# Patient Record
Sex: Male | Born: 1950 | Race: Black or African American | Hispanic: No | Smoking: Current every day smoker
Health system: Southern US, Community
[De-identification: ages and names within clinical notes are randomized; demographics above are authoritative.]

## PROBLEM LIST (undated history)

## (undated) DIAGNOSIS — E876 Hypokalemia: Secondary | ICD-10-CM

## (undated) DIAGNOSIS — F0390 Unspecified dementia without behavioral disturbance: Secondary | ICD-10-CM

## (undated) DIAGNOSIS — E119 Type 2 diabetes mellitus without complications: Secondary | ICD-10-CM

## (undated) DIAGNOSIS — I10 Essential (primary) hypertension: Secondary | ICD-10-CM

## (undated) DIAGNOSIS — E7800 Pure hypercholesterolemia, unspecified: Secondary | ICD-10-CM

## (undated) HISTORY — DX: Unspecified dementia, unspecified severity, without behavioral disturbance, psychotic disturbance, mood disturbance, and anxiety: F03.90

## (undated) HISTORY — DX: Hypokalemia: E87.6

---

## 2018-10-02 ENCOUNTER — Encounter: Payer: Self-pay | Admitting: Emergency Medicine

## 2018-10-02 ENCOUNTER — Emergency Department: Payer: Medicare HMO

## 2018-10-02 ENCOUNTER — Inpatient Hospital Stay
Admission: EM | Admit: 2018-10-02 | Discharge: 2018-10-07 | DRG: 065 | Disposition: A | Discharge status: Home Health | Payer: Medicare HMO | Attending: Internal Medicine | Admitting: Internal Medicine

## 2018-10-02 ENCOUNTER — Other Ambulatory Visit: Payer: Self-pay

## 2018-10-02 DIAGNOSIS — I639 Cerebral infarction, unspecified: Principal | ICD-10-CM | POA: Diagnosis present

## 2018-10-02 DIAGNOSIS — H5310 Unspecified subjective visual disturbances: Secondary | ICD-10-CM

## 2018-10-02 DIAGNOSIS — N400 Enlarged prostate without lower urinary tract symptoms: Secondary | ICD-10-CM | POA: Diagnosis present

## 2018-10-02 DIAGNOSIS — N179 Acute kidney failure, unspecified: Secondary | ICD-10-CM | POA: Diagnosis present

## 2018-10-02 DIAGNOSIS — R4182 Altered mental status, unspecified: Secondary | ICD-10-CM

## 2018-10-02 DIAGNOSIS — Z79899 Other long term (current) drug therapy: Secondary | ICD-10-CM

## 2018-10-02 DIAGNOSIS — K219 Gastro-esophageal reflux disease without esophagitis: Secondary | ICD-10-CM | POA: Diagnosis present

## 2018-10-02 DIAGNOSIS — E119 Type 2 diabetes mellitus without complications: Secondary | ICD-10-CM | POA: Diagnosis present

## 2018-10-02 DIAGNOSIS — R0600 Dyspnea, unspecified: Secondary | ICD-10-CM

## 2018-10-02 DIAGNOSIS — E785 Hyperlipidemia, unspecified: Secondary | ICD-10-CM | POA: Diagnosis present

## 2018-10-02 DIAGNOSIS — F17210 Nicotine dependence, cigarettes, uncomplicated: Secondary | ICD-10-CM | POA: Diagnosis present

## 2018-10-02 DIAGNOSIS — Z1159 Encounter for screening for other viral diseases: Secondary | ICD-10-CM

## 2018-10-02 DIAGNOSIS — Z7984 Long term (current) use of oral hypoglycemic drugs: Secondary | ICD-10-CM

## 2018-10-02 DIAGNOSIS — I10 Essential (primary) hypertension: Secondary | ICD-10-CM | POA: Diagnosis present

## 2018-10-02 DIAGNOSIS — E7800 Pure hypercholesterolemia, unspecified: Secondary | ICD-10-CM | POA: Diagnosis present

## 2018-10-02 HISTORY — DX: Type 2 diabetes mellitus without complications: E11.9

## 2018-10-02 HISTORY — DX: Essential (primary) hypertension: I10

## 2018-10-02 HISTORY — DX: Pure hypercholesterolemia, unspecified: E78.00

## 2018-10-02 LAB — COMPREHENSIVE METABOLIC PANEL
ALT: 14 U/L (ref 0–44)
AST: 17 U/L (ref 15–41)
Albumin: 4.1 g/dL (ref 3.5–5.0)
Alkaline Phosphatase: 43 U/L (ref 38–126)
Anion gap: 10 (ref 5–15)
BUN: 21 mg/dL (ref 8–23)
CO2: 26 mmol/L (ref 22–32)
Calcium: 9.6 mg/dL (ref 8.9–10.3)
Chloride: 104 mmol/L (ref 98–111)
Creatinine, Ser: 2.14 mg/dL — ABNORMAL HIGH (ref 0.61–1.24)
GFR calc Af Amer: 36 mL/min — ABNORMAL LOW (ref >60)
GFR calc non Af Amer: 31 mL/min — ABNORMAL LOW (ref >60)
Glucose, Bld: 122 mg/dL — ABNORMAL HIGH (ref 70–99)
Potassium: 4.2 mmol/L (ref 3.5–5.1)
Sodium: 140 mmol/L (ref 135–145)
Total Bilirubin: 1.2 mg/dL (ref 0.3–1.2)
Total Protein: 6.8 g/dL (ref 6.5–8.1)

## 2018-10-02 LAB — CBC WITH DIFFERENTIAL/PLATELET
Abs Immature Granulocytes: 0.01 10*3/uL (ref 0.00–0.07)
Basophils Absolute: 0 10*3/uL (ref 0.0–0.1)
Basophils Relative: 0 %
Eosinophils Absolute: 0 10*3/uL (ref 0.0–0.5)
Eosinophils Relative: 0 %
HCT: 46 % (ref 39.0–52.0)
Hemoglobin: 15.1 g/dL (ref 13.0–17.0)
Immature Granulocytes: 0 %
Lymphocytes Relative: 33 %
Lymphs Abs: 1.6 10*3/uL (ref 0.7–4.0)
MCH: 32.2 pg (ref 26.0–34.0)
MCHC: 32.8 g/dL (ref 30.0–36.0)
MCV: 98.1 fL (ref 80.0–100.0)
Monocytes Absolute: 0.4 10*3/uL (ref 0.1–1.0)
Monocytes Relative: 9 %
Neutro Abs: 2.8 10*3/uL (ref 1.7–7.7)
Neutrophils Relative %: 58 %
Platelets: 200 10*3/uL (ref 150–400)
RBC: 4.69 MIL/uL (ref 4.22–5.81)
RDW: 11.9 % (ref 11.5–15.5)
WBC: 4.9 10*3/uL (ref 4.0–10.5)
nRBC: 0 % (ref 0.0–0.2)

## 2018-10-02 LAB — GLUCOSE, CAPILLARY: Glucose-Capillary: 121 mg/dL — ABNORMAL HIGH (ref 70–99)

## 2018-10-02 LAB — URINALYSIS, COMPLETE (UACMP) WITH MICROSCOPIC
Bacteria, UA: NONE SEEN
Bilirubin Urine: NEGATIVE
Glucose, UA: NEGATIVE mg/dL
Hgb urine dipstick: NEGATIVE
Ketones, ur: NEGATIVE mg/dL
Leukocytes,Ua: NEGATIVE
Nitrite: NEGATIVE
Protein, ur: 100 mg/dL — AB
Specific Gravity, Urine: 1.024 (ref 1.005–1.030)
pH: 5 (ref 5.0–8.0)

## 2018-10-02 LAB — AMMONIA: Ammonia: 14 umol/L (ref 9–35)

## 2018-10-02 LAB — SARS CORONAVIRUS 2 BY RT PCR (HOSPITAL ORDER, PERFORMED IN ~~LOC~~ HOSPITAL LAB): SARS Coronavirus 2: NEGATIVE

## 2018-10-02 LAB — TROPONIN I: Troponin I: 0.03 ng/mL (ref <0.03)

## 2018-10-02 LAB — TSH: TSH: 3.899 u[IU]/mL (ref 0.350–4.500)

## 2018-10-02 MED ORDER — FLUORESCEIN SODIUM 1 MG OP STRP
1.0000 | ORAL_STRIP | Freq: Once | OPHTHALMIC | Status: AC
  Administered 2018-10-02: 18:00:00 1 via OPHTHALMIC
  Filled 2018-10-02: qty 1

## 2018-10-02 MED ORDER — GENTAMICIN SULFATE 0.3 % OP SOLN: 1.0000 [drp] | Freq: Four times a day (QID) | OPHTHALMIC | 0 refills | Status: DC

## 2018-10-02 MED ORDER — TETRACAINE HCL 0.5 % OP SOLN
2.0000 [drp] | Freq: Once | OPHTHALMIC | Status: AC
  Administered 2018-10-02: 2 [drp] via OPHTHALMIC
  Filled 2018-10-02: qty 4

## 2018-10-02 MED ORDER — ASPIRIN 81 MG PO CHEW
324.0000 mg | CHEWABLE_TABLET | Freq: Once | ORAL | Status: AC
  Administered 2018-10-02: 324 mg via ORAL
  Filled 2018-10-02: qty 4

## 2018-10-02 NOTE — ED Provider Notes (Signed)
  Physical Exam  BP 105/66 (BP Location: Left Arm)   Pulse (!) 108   Temp 98.5 F (36.9 C) (Oral)   Resp 16   Ht 5\' 9"  (1.753 m)   Wt 94.3 kg   SpO2 97%   BMI 30.72 kg/m   Physical Exam Patient appears confused. ED Course/Procedures   Patient presents with altered mental status secondary to questionable contusion 2 days ago.  Procedures  MDM  Patient presents with altered mental status without acute CT head findings.  Patient will be further evaluated by Dr. Redmond School for definitive diagnosis and treatment.       Sable Feil, PA-C 10/02/18 1908    Nena Polio, MD 10/02/18 [phone]
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
## 2018-10-02 NOTE — ED Notes (Signed)
Went to discharge pt  Speech is slurred  States this is not normal for him  His gait is slightly unsteady

## 2018-10-03 ENCOUNTER — Inpatient Hospital Stay: Payer: Medicare HMO

## 2018-10-03 ENCOUNTER — Inpatient Hospital Stay
Admit: 2018-10-03 | Discharge: 2018-10-03 | Disposition: A | Discharge status: Home / Self Care | Payer: Medicare HMO | Attending: Nurse Practitioner | Admitting: Nurse Practitioner

## 2018-10-03 DIAGNOSIS — N179 Acute kidney failure, unspecified: Secondary | ICD-10-CM | POA: Diagnosis present

## 2018-10-03 DIAGNOSIS — Z79899 Other long term (current) drug therapy: Secondary | ICD-10-CM | POA: Diagnosis not present

## 2018-10-03 DIAGNOSIS — I10 Essential (primary) hypertension: Secondary | ICD-10-CM | POA: Diagnosis present

## 2018-10-03 DIAGNOSIS — R4182 Altered mental status, unspecified: Secondary | ICD-10-CM | POA: Diagnosis present

## 2018-10-03 DIAGNOSIS — I639 Cerebral infarction, unspecified: Secondary | ICD-10-CM | POA: Diagnosis present

## 2018-10-03 DIAGNOSIS — E7800 Pure hypercholesterolemia, unspecified: Secondary | ICD-10-CM | POA: Diagnosis present

## 2018-10-03 DIAGNOSIS — K219 Gastro-esophageal reflux disease without esophagitis: Secondary | ICD-10-CM | POA: Diagnosis present

## 2018-10-03 DIAGNOSIS — E785 Hyperlipidemia, unspecified: Secondary | ICD-10-CM | POA: Diagnosis present

## 2018-10-03 DIAGNOSIS — N400 Enlarged prostate without lower urinary tract symptoms: Secondary | ICD-10-CM | POA: Diagnosis present

## 2018-10-03 DIAGNOSIS — Z7984 Long term (current) use of oral hypoglycemic drugs: Secondary | ICD-10-CM | POA: Diagnosis not present

## 2018-10-03 DIAGNOSIS — F17210 Nicotine dependence, cigarettes, uncomplicated: Secondary | ICD-10-CM | POA: Diagnosis present

## 2018-10-03 DIAGNOSIS — E119 Type 2 diabetes mellitus without complications: Secondary | ICD-10-CM | POA: Diagnosis present

## 2018-10-03 DIAGNOSIS — Z1159 Encounter for screening for other viral diseases: Secondary | ICD-10-CM | POA: Diagnosis not present

## 2018-10-03 HISTORY — DX: Cerebral infarction, unspecified: I63.9

## 2018-10-03 LAB — LIPID PANEL
Cholesterol: 122 mg/dL (ref 0–200)
HDL: 42 mg/dL (ref >40)
LDL Cholesterol: 65 mg/dL (ref 0–99)
Total CHOL/HDL Ratio: 2.9 RATIO
Triglycerides: 76 mg/dL (ref <150)
VLDL: 15 mg/dL (ref 0–40)

## 2018-10-03 LAB — CREATININE, SERUM
Creatinine, Ser: 2.04 mg/dL — ABNORMAL HIGH (ref 0.61–1.24)
GFR calc Af Amer: 38 mL/min — ABNORMAL LOW (ref >60)
GFR calc non Af Amer: 33 mL/min — ABNORMAL LOW (ref >60)

## 2018-10-03 LAB — CBC
HCT: 42.7 % (ref 39.0–52.0)
Hemoglobin: 14.1 g/dL (ref 13.0–17.0)
MCH: 32.5 pg (ref 26.0–34.0)
MCHC: 33 g/dL (ref 30.0–36.0)
MCV: 98.4 fL (ref 80.0–100.0)
Platelets: 191 10*3/uL (ref 150–400)
RBC: 4.34 MIL/uL (ref 4.22–5.81)
RDW: 11.7 % (ref 11.5–15.5)
WBC: 4.1 10*3/uL (ref 4.0–10.5)
nRBC: 0 % (ref 0.0–0.2)

## 2018-10-03 LAB — GLUCOSE, CAPILLARY
Glucose-Capillary: 131 mg/dL — ABNORMAL HIGH (ref 70–99)
Glucose-Capillary: 164 mg/dL — ABNORMAL HIGH (ref 70–99)
Glucose-Capillary: 179 mg/dL — ABNORMAL HIGH (ref 70–99)
Glucose-Capillary: 100 mg/dL — ABNORMAL HIGH (ref 70–99)

## 2018-10-03 LAB — HEMOGLOBIN A1C
Hgb A1c MFr Bld: 5.5 % (ref 4.8–5.6)
Mean Plasma Glucose: 111.15 mg/dL

## 2018-10-03 LAB — ECHOCARDIOGRAM COMPLETE
Height: 69 in
Weight: 3328 oz

## 2018-10-03 MED ORDER — INSULIN ASPART 100 UNIT/ML ~~LOC~~ SOLN: 0.0000 [IU] | Freq: Three times a day (TID) | SUBCUTANEOUS | Status: DC

## 2018-10-03 MED ORDER — ACETAMINOPHEN 160 MG/5ML PO SOLN
650.0000 mg | ORAL | Status: DC | PRN
  Filled 2018-10-03: qty 20.3

## 2018-10-03 MED ORDER — STROKE: EARLY STAGES OF RECOVERY BOOK
Freq: Once | Status: AC
  Administered 2018-10-03: 04:00:00

## 2018-10-03 MED ORDER — INSULIN ASPART 100 UNIT/ML ~~LOC~~ SOLN
0.0000 [IU] | Freq: Three times a day (TID) | SUBCUTANEOUS | Status: DC
  Administered 2018-10-03: 2 [IU] via SUBCUTANEOUS
  Administered 2018-10-03 – 2018-10-04 (×2): 3 [IU] via SUBCUTANEOUS
  Administered 2018-10-05: 2 [IU] via SUBCUTANEOUS
  Administered 2018-10-05: 3 [IU] via SUBCUTANEOUS
  Administered 2018-10-06: 10:00:00 2 [IU] via SUBCUTANEOUS
  Administered 2018-10-06: 3 [IU] via SUBCUTANEOUS
  Administered 2018-10-06 – 2018-10-07 (×2): 2 [IU] via SUBCUTANEOUS
  Filled 2018-10-03 (×9): qty 1

## 2018-10-03 MED ORDER — METFORMIN HCL 500 MG PO TABS
500.0000 mg | ORAL_TABLET | Freq: Two times a day (BID) | ORAL | Status: DC
  Filled 2018-10-03 (×3): qty 1

## 2018-10-03 MED ORDER — ENOXAPARIN SODIUM 40 MG/0.4ML ~~LOC~~ SOLN
40.0000 mg | SUBCUTANEOUS | Status: DC
  Administered 2018-10-04 – 2018-10-06 (×3): 40 mg via SUBCUTANEOUS
  Filled 2018-10-03 (×3): qty 0.4

## 2018-10-03 MED ORDER — HALOPERIDOL LACTATE 5 MG/ML IJ SOLN
2.5000 mg | INTRAMUSCULAR | Status: DC | PRN
  Administered 2018-10-03 – 2018-10-06 (×3): 2.5 mg via INTRAVENOUS
  Filled 2018-10-03 (×3): qty 1

## 2018-10-03 MED ORDER — GLIPIZIDE ER 10 MG PO TB24
10.0000 mg | ORAL_TABLET | Freq: Every day | ORAL | Status: DC
  Administered 2018-10-03 – 2018-10-06 (×3): 10 mg via ORAL
  Filled 2018-10-03 (×5): qty 1

## 2018-10-03 MED ORDER — LORAZEPAM 2 MG/ML IJ SOLN
2.0000 mg | Freq: Once | INTRAMUSCULAR | Status: AC
  Administered 2018-10-03: 15:00:00 2 mg via INTRAVENOUS
  Filled 2018-10-03: qty 1

## 2018-10-03 MED ORDER — ASPIRIN 325 MG PO TABS
325.0000 mg | ORAL_TABLET | Freq: Every day | ORAL | Status: DC
  Administered 2018-10-04 – 2018-10-06 (×3): 325 mg via ORAL
  Filled 2018-10-03 (×5): qty 1

## 2018-10-03 MED ORDER — INSULIN ASPART 100 UNIT/ML ~~LOC~~ SOLN
0.0000 [IU] | Freq: Every day | SUBCUTANEOUS | Status: DC
  Administered 2018-10-05: 22:00:00 5 [IU] via SUBCUTANEOUS
  Filled 2018-10-03: qty 1

## 2018-10-03 MED ORDER — INSULIN ASPART 100 UNIT/ML ~~LOC~~ SOLN: 0.0000 [IU] | Freq: Every day | SUBCUTANEOUS | Status: DC

## 2018-10-03 MED ORDER — SODIUM CHLORIDE 0.9 % IV SOLN
INTRAVENOUS | Status: DC
  Administered 2018-10-03: 04:00:00 via INTRAVENOUS

## 2018-10-03 MED ORDER — ACETAMINOPHEN 325 MG PO TABS: 650.0000 mg | ORAL_TABLET | ORAL | Status: DC | PRN

## 2018-10-03 MED ORDER — AMLODIPINE BESYLATE 10 MG PO TABS
10.0000 mg | ORAL_TABLET | Freq: Every day | ORAL | Status: DC
  Administered 2018-10-03 – 2018-10-05 (×3): 10 mg via ORAL
  Filled 2018-10-03 (×3): qty 1

## 2018-10-03 MED ORDER — PANTOPRAZOLE SODIUM 40 MG IV SOLR
40.0000 mg | INTRAVENOUS | Status: DC
  Administered 2018-10-03 – 2018-10-05 (×3): 40 mg via INTRAVENOUS
  Filled 2018-10-03 (×3): qty 40

## 2018-10-03 MED ORDER — ACETAMINOPHEN 650 MG RE SUPP: 650.0000 mg | RECTAL | Status: DC | PRN

## 2018-10-03 MED ORDER — TAMSULOSIN HCL 0.4 MG PO CAPS
0.4000 mg | ORAL_CAPSULE | Freq: Two times a day (BID) | ORAL | Status: DC
  Administered 2018-10-03 – 2018-10-06 (×6): 0.4 mg via ORAL
  Filled 2018-10-03 (×6): qty 1

## 2018-10-03 MED ORDER — LISINOPRIL 20 MG PO TABS
20.0000 mg | ORAL_TABLET | Freq: Every day | ORAL | Status: DC
  Administered 2018-10-03 – 2018-10-06 (×4): 20 mg via ORAL
  Filled 2018-10-03 (×4): qty 1

## 2018-10-03 MED ORDER — ENOXAPARIN SODIUM 40 MG/0.4ML ~~LOC~~ SOLN: 30.0000 mg | SUBCUTANEOUS | Status: DC

## 2018-10-03 MED ORDER — ATORVASTATIN CALCIUM 20 MG PO TABS
40.0000 mg | ORAL_TABLET | Freq: Every day | ORAL | Status: DC
  Administered 2018-10-03 – 2018-10-06 (×4): 40 mg via ORAL
  Filled 2018-10-03 (×4): qty 2

## 2018-10-03 MED ORDER — SENNOSIDES-DOCUSATE SODIUM 8.6-50 MG PO TABS: 1.0000 | ORAL_TABLET | Freq: Every evening | ORAL | Status: DC | PRN

## 2018-10-03 NOTE — Progress Notes (Signed)
*  PRELIMINARY RESULTS* Echocardiogram 2D Echocardiogram has been performed.  Steven Ingram 10/03/2018, 11:05 AM
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
## 2018-10-03 NOTE — Progress Notes (Signed)
Code 300 called due to patient wandering and not able to be redirected. Wife and daughter called to attempt reorientation. Patient adamant about leaving and going home. Patient attempting to go in other patients rooms.   Haldol given as ordered.   2:55 PM Wife on the way to stay with patient to assist with behavior.   Madlyn Frankel, RN

## 2018-10-03 NOTE — Progress Notes (Signed)
Anticoagulation monitoring(Lovenox):  68yo  male ordered Lovenox 30 mg Q24h for DVT prevention  Filed Weights   10/02/18 1611  Weight: 208 lb (94.3 kg)   BMI 30.72   Lab Results  Component Value Date   CREATININE 2.14 (H) 10/02/2018   Estimated Creatinine Clearance: 37.4 mL/min (A) (by C-G formula based on SCr of 2.14 mg/dL (H)). Hemoglobin & Hematocrit     Component Value Date/Time   HGB 15.1 10/02/2018 1932   HCT 46.0 10/02/2018 1932     Per Protocol for Patient with estCrcl > 30 ml/min and BMI < 40, will transition to Lovenox 40 mg Q24h.     Paulina Fusi, PharmD, BCPS 10/03/2018 3:59 AM

## 2018-10-03 NOTE — Plan of Care (Signed)
  Problem: Education: Goal: Knowledge of disease or condition will improve Outcome: Progressing   Problem: Education: Goal: Knowledge of secondary prevention will improve Outcome: Progressing   Problem: Coping: Goal: Will identify appropriate support needs Outcome: Progressing   Problem: Self-Care: Goal: Ability to participate in self-care as condition permits will improve Outcome: Progressing   Problem: Self-Care: Goal: Ability to communicate needs accurately will improve Outcome: Progressing   Problem: Clinical Measurements: Goal: Diagnostic test results will improve Outcome: Progressing   Problem: Pain Managment: Goal: General experience of comfort will improve Outcome: Progressing   Problem: Safety: Goal: Ability to remain free from injury will improve Outcome: Progressing

## 2018-10-03 NOTE — Evaluation (Signed)
Occupational Therapy Evaluation Patient Details Name: Steven Ingram MRN: [id number] DOB: [birth year] Today's Date: 10/03/2018    History of Present Illness 68 y/o male here with L sided head ache, eye pain and found to have had small CVA.  At time of PT eval he reports feeling much better than on arrival and near his baseline.    Clinical Impression   Pt seen for OT evaluation this date. Pt received ambulating in room, placing pillows in closet, RN having just left room. Pt denies deficits or symptoms, eager to return home. Supervision to CGA for ambulation, pt refusing RW. Max verbal cues for safety. No awareness of deficits or safety. No overt deficits in strength, coordination, pt denies sensory deficits. Pt denies visual deficits and no visual deficits appreciated upon assessment. Pt demonstrates impaired cognition, particularly awareness/safety. Pt will benefit from skilled OT at home with intermittent supervision for safety. Pt unsafe to drive at this time 2/2 his cognitive deficits. All further OT needs can be addressed at the next venue. Will sign off in house.     Follow Up Recommendations  Home health OT;Supervision - Intermittent    Equipment Recommendations  None recommended by OT    Recommendations for Other Services       Precautions / Restrictions Precautions Precautions: Fall Restrictions Weight Bearing Restrictions: No      Mobility Bed Mobility Overal bed mobility: Independent                Transfers Overall transfer level: Modified independent Equipment used: None                  Balance Overall balance assessment: Mild deficits observed, not formally tested                                         ADL either performed or assessed with clinical judgement   ADL Overall ADL's : Needs assistance/impaired                                       General ADL Comments: Requires supervision for safety      Vision Baseline Vision/History: No visual deficits Patient Visual Report: No change from baseline Vision Assessment?: No apparent visual deficits     Perception     Praxis      Pertinent Vitals/Pain Pain Assessment: No/denies pain     Hand Dominance     Extremity/Trunk Assessment Upper Extremity Assessment Upper Extremity Assessment: Overall WFL for tasks assessed;Difficult to assess due to impaired cognition   Lower Extremity Assessment Lower Extremity Assessment: Overall WFL for tasks assessed;Difficult to assess due to impaired cognition   Cervical / Trunk Assessment Cervical / Trunk Assessment: Normal   Communication Communication Communication: No difficulties   Cognition Arousal/Alertness: Awake/alert Behavior During Therapy: Restless;Impulsive Overall Cognitive Status: No family/caregiver present to determine baseline cognitive functioning                                 General Comments: Pt impulsive, restless, will not sit down for more than a few minutes, requires mod verbal cues to redirect, oriented to self and generally to place, follows simple commands with encouragement, poor safety awareness and awareness of deficits   General Comments  Exercises Other Exercises Other Exercises: amb around nursing unit x2 requiring cues for safety and negotiating environmental barriers   Shoulder Instructions      Home Living Family/patient expects to be discharged to:: Private residence Living Arrangements: Spouse/significant other(girlfriend)     Home Access: Stairs to enter                     [address]: None          Prior Functioning/Environment Level of Independence: Independent        Comments: Pt reports he is able to drive, be relatively active, does not need AD        OT Problem List: Decreased cognition;Decreased safety awareness;Impaired balance (sitting and/or standing)      OT  Treatment/Interventions:      OT Goals(Current goals can be found in the care plan section) Acute Rehab OT Goals Patient Stated Goal: Go home OT Goal Formulation: All assessment and education complete, DC therapy ADL Goals Additional ADL Goal #1: Pt will perform UB and LB dressing tasks with supervision and Min VC for safety. Additional ADL Goal #2: Pt will demonstrate ability to safely negotiate environment with supervision and Min VC for safety.  OT Frequency:     Barriers to D/C:            Co-evaluation              AM-PAC OT "6 Clicks" Daily Activity     Outcome Measure Help from another person eating meals?: None Help from another person taking care of personal grooming?: None Help from another person toileting, which includes using toliet, bedpan, or urinal?: A Little Help from another person bathing (including washing, rinsing, drying)?: A Little Help from another person to put on and taking off regular upper body clothing?: None Help from another person to put on and taking off regular lower body clothing?: A Little 6 Click Score: 21   End of Session    Activity Tolerance: Patient tolerated treatment well Patient left: Other (comment)(amb back to room with RN)  OT Visit Diagnosis: Other symptoms and signs involving cognitive function;Other abnormalities of gait and mobility (R26.89)                Time: 3832-9191 OT Time Calculation (min): 17 min Charges:  OT General Charges $OT Visit: 1 Visit OT Evaluation $OT Eval Low Complexity: 1 Low  Jeni Salles, MPH, MS, OTR/L ascom [phone] 10/03/18, 3:10 PM

## 2018-10-03 NOTE — Progress Notes (Signed)
[facility] [place] NAME: Steven Ingram    MR#:  [id number]  DATE OF BIRTH:  12/18/1950  SUBJECTIVE:   he presented to the hospital due to altered mental status and confusion and had a MRI of the brain which was positive for a Subcentimeter acute/early subacute infarctions within left posterior external capsule and right posteromedial pons.  Patient clinically denies any complaints presently.  He is somewhat confused and altered.  REVIEW OF SYSTEMS:    Review of Systems  Unable to perform ROS: Mental acuity    Nutrition: Heart Healthy/Carb modified Tolerating Diet: Yes Tolerating PT: Eval noted.    DRUG ALLERGIES:  No Known Allergies  VITALS:  Blood pressure (!) 161/97, pulse 74, temperature 98.4 F (36.9 C), temperature source Oral, resp. rate 19, height 5\' 9"  (1.753 m), weight 94.3 kg, SpO2 98 %.  PHYSICAL EXAMINATION:   Physical Exam  GENERAL:  68 y.o.-year-old patient sitting up in bed in no acute distress.  EYES: Pupils equal, round, reactive to light and accommodation. No scleral icterus. Extraocular muscles intact.  HEENT: Head atraumatic, normocephalic. Oropharynx and nasopharynx clear.  NECK:  Supple, no jugular venous distention. No thyroid enlargement, no tenderness.  LUNGS: Normal breath sounds bilaterally, no wheezing, rales, rhonchi. No use of accessory muscles of respiration.  CARDIOVASCULAR: S1, S2 normal. No murmurs, rubs, or gallops.  ABDOMEN: Soft, nontender, nondistended. Bowel sounds present. No organomegaly or mass.  EXTREMITIES: No cyanosis, clubbing or edema b/l.    NEUROLOGIC: Cranial nerves II through XII are intact. No focal Motor or sensory deficits b/l.   PSYCHIATRIC: The patient is alert and oriented x 2.  SKIN: No obvious rash, lesion, or ulcer.    LABORATORY PANEL:   CBC Recent Labs  Lab 10/03/18 0440  WBC 4.1  HGB 14.1  HCT 42.7  PLT 191    ------------------------------------------------------------------------------------------------------------------  Chemistries  Recent Labs  Lab 10/02/18 1932 10/03/18 0440  NA 140  --   K 4.2  --   CL 104  --   CO2 26  --   GLUCOSE 122*  --   BUN 21  --   CREATININE 2.14* 2.04*  CALCIUM 9.6  --   AST 17  --   ALT 14  --   ALKPHOS 43  --   BILITOT 1.2  --    ------------------------------------------------------------------------------------------------------------------  Cardiac Enzymes Recent Labs  Lab 10/02/18 1932  TROPONINI 0.03*   ------------------------------------------------------------------------------------------------------------------  RADIOLOGY:  Dg Chest 2 View  Result Date: 10/03/2018 CLINICAL DATA:  68 y/o  M; dyspnea. EXAM: CHEST - 2 VIEW COMPARISON:  10/02/2018 chest radiograph. FINDINGS: Stable cardiac silhouette within normal limits given projection and technique. Aortic atherosclerosis with calcification. Mild bronchitic changes in the lung bases. No focal consolidation. No pleural effusion or pneumothorax. No acute osseous abnormality is evident. Surgical clips project over the right upper quadrant. IMPRESSION: Mild bronchitic changes in the lung bases. No focal consolidation. Electronically Signed   By: Kristine Garbe M.D.   On: 10/03/2018 02:03   Ct Head Wo Contrast  Result Date: 10/02/2018 CLINICAL DATA:  Altered level consciousness.  Fall 2 days ago. EXAM: CT HEAD WITHOUT CONTRAST TECHNIQUE: Contiguous axial images were obtained from the base of the skull through the vertex without intravenous contrast. COMPARISON:  None. FINDINGS: Brain: Mild atrophy. Mild chronic ischemia in the thalamus bilaterally. Hypodensity left frontal white matter consistent with chronic ischemia. Negative for acute infarct, hemorrhage, or mass lesion. Vascular:  Negative for hyperdense vessel Skull: Negative for skull fracture. Sinuses/Orbits: Negative Other:  Soft tissue swelling left parietal scalp most compatible with recent contusion. IMPRESSION: No acute intracranial abnormality.  Chronic ischemic changes. Left parietal scalp contusion. Electronically Signed   By: Franchot Gallo M.D.   On: 10/02/2018 18:50   Mr Brain Wo Contrast  Result Date: 10/02/2018 CLINICAL DATA:  68 y/o  M; confusion, unsteady, slurred speech. EXAM: MRI HEAD WITHOUT CONTRAST TECHNIQUE: Multiplanar, multiecho pulse sequences of the brain and surrounding structures were obtained without intravenous contrast. COMPARISON:  10/02/2018 CT head. FINDINGS: Brain: Subcentimeter foci of reduced diffusion within the left posterior external capsule and right posteromedial pons (series 3 image 18 and 27) compatible with acute/early subacute infarction. Infarcts are T2 FLAIR hyperintense. No hemorrhage or mass effect. Early confluent nonspecific T2 FLAIR hyperintensities in subcortical and periventricular white matter as well as the pons are compatible with moderate chronic microvascular ischemic changes. Moderate volume loss of the brain. Very small chronic infarcts are present within the bilateral thalami, left anterior corona radiata, and the pons. 11 mm dural nodule over the left posterior frontal convexity (series 9, image 20 and series 12, image 16). No mass effect on the brain. Vascular: Normal flow voids. Skull and upper cervical spine: Normal marrow signal. Sinuses/Orbits: Negative. Other: None. IMPRESSION: 1. Subcentimeter acute/early subacute infarctions within left posterior external capsule and right posteromedial pons. No hemorrhage or mass effect. 2. Moderate chronic microvascular ischemic changes and volume loss of the brain. 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 3. 11 mm dural nodule over the left posterior frontal convexity, likely meningioma. These results were called by telephone at the time of interpretation on 10/02/2018 at 9:29 pm to Dr. Conni Slipper , who  verbally acknowledged these results. Electronically Signed   By: Kristine Garbe M.D.   On: 10/02/2018 21:30   Dg Chest Portable 1 View  Result Date: 10/02/2018 CLINICAL DATA:  Altered mental status EXAM: PORTABLE CHEST 1 VIEW COMPARISON:  None. FINDINGS: The heart size and mediastinal contours are within normal limits. Both lungs are clear. The visualized skeletal structures are unremarkable. IMPRESSION: No active disease. Electronically Signed   By: Donavan Foil M.D.   On: 10/02/2018 19:36     ASSESSMENT AND PLAN:   68 year old male with past medical history of hypertension, hyperlipidemia, diabetes who presented to the hospital due to altered mental status/confusion.  1.  Altered mental status/confusion-patient presented to the hospital due to the above complaints.  Patient's MRI showed a subacute/acute infarction in left posterior external capsule in the right posterior medial pons. - Continue aspirin.  Mental status slightly improved. - Given midbrain CVA await MRA of the brain and neck.  Appreciate neurology consult. - PT evaluation noted, await OT evaluation, patient is tolerating p.o. well. -Lipid panel is at goal and continue atorvastatin.  2.  Essential hypertension-continue Norvasc, Lisinopril.   3. Acute Renal failure - likely pre-renal in nature. Cr. Is improving slightly. Cr. In March'20 was 1.3 in care everywhere.  - cont. To monitor. Taking PO well and will d/c IV fluids for now. Follow BUN/Cr.   4. DM type II - cont. SSI, Glipizide, hold Metformin due to ARF.    5. GERD - cont. Protonix.   6. BPH - cont. Flomax.     All the records are reviewed and case discussed with Care Management/Social Worker. Management plans discussed with the patient, family and they are in agreement.  CODE STATUS: Full code  DVT Prophylaxis: Lovenox  TOTAL TIME TAKING CARE OF THIS PATIENT: 30 minutes.   POSSIBLE D/C IN 1-2 DAYS, DEPENDING ON CLINICAL CONDITION.   Henreitta Leber M.D on 10/03/2018 at 12:23 PM  Between 7am to 6pm - Pager - [phone]  After 6pm go to [email or web address] - [employer] [facility]  Office  [phone]  CC: Primary care physician; [employer]

## 2018-10-03 NOTE — TOC Initial Note (Signed)
Transition of Care [facility]) - Initial/Assessment Note    Patient Details  Name: Steven Ingram MRN: [id number] Date of Birth: July 28, 1950  [facility]) CM/SW Contact:    Su Hilt, RN Phone Number: 10/03/2018, 12:08 PM  Clinical Narrative:                 Called the home number [phone] left a voice mail requesting a call back        Patient Goals and CMS Choice        Expected Discharge Plan and Services                                                Prior Living Arrangements/Services                       Activities of Daily Living Home Assistive Devices/Equipment: None ADL Screening (condition at time of admission) Patient's cognitive ability adequate to safely complete daily activities?: Yes Is the patient deaf or have difficulty hearing?: No Does the patient have difficulty seeing, even when wearing glasses/contacts?: No Does the patient have difficulty concentrating, remembering, or making decisions?: Yes Patient able to express need for assistance with ADLs?: Yes Does the patient have difficulty dressing or bathing?: No Independently performs ADLs?: Yes (appropriate for developmental age) Does the patient have difficulty walking or climbing stairs?: No Weakness of Legs: None Weakness of Arms/Hands: None  Permission Sought/Granted                  Emotional Assessment              Admission diagnosis:  Dyspnea [R06.00] Subjective vision disturbance, left eye [H53.10] Altered mental status, unspecified altered mental status type [R41.82] Cerebrovascular accident (CVA), unspecified mechanism ([place]) [I63.9] Patient Active Problem List   Diagnosis Date Noted  . CVA (cerebral vascular accident) ([place]) 10/03/2018   PCP:  [place], [place]:   [id number] - [place], [place] [address] Phone: [phone] Fax: [phone]     Social  Determinants of Health (SDOH) Interventions    Readmission Risk Interventions No flowsheet data found.
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
## 2018-10-03 NOTE — Progress Notes (Addendum)
SLP Cancellation Note  Patient Details Name: Steven Ingram MRN: [id number] DOB: 02-03-1951   Cancelled treatment:       Reason Eval/Treat Not Completed: Patient at procedure or test/unavailable(will f/u this afternoon).  Addendum: pt has returned from test but now walking out into the hallway asking for his "shoes" after going to the bathroom. NA redirected pt into his room to order then eat lunch. Pt verbally conversed w/ NA w/ slightly mumbled speech but fully intelligible. Noted MRI revealed "Moderate chronic microvascular ischemic changes and volume loss of the brain".  NSG denied any deficits of swallowing during pills/meal earlier. ST services will f/u tomorrow w/ assessment.     Orinda Kenner, [place], CCC-SLP Watson,Katherine 10/03/2018, 12:15 PM

## 2018-10-03 NOTE — Evaluation (Signed)
Physical Therapy Evaluation Patient Details Name: Steven Ingram MRN: [id number] DOB: 11/30/1950 Today's Date: 10/03/2018   History of Present Illness  68 y/o male here with L sided head ache, eye pain and found to have had small CVA.  At time of PT eval he reports feeling much better than on arrival and near his baseline.   Clinical Impression  Pt did relatively well with ambulation but did have some issues with impulsivity and safety awareness.  He did not have any LOBs or overt unsteadiness issues and reports being near his baseline regarding speed and cadence.  Pt was able to negotiate up/down steps safely with single UE assist and overall showed ability to return home.  Pt unsure of significant other situation, per some safety awareness it would be very good to at least have intermittent assistance available.  Overall pt was able to manage w/o phyiscal assist and needed only light cuing and guidance for mild safety issues.     Follow Up Recommendations Home health PT;Supervision - Intermittent(Pt not interested in HHPT, unsure if assist will be availabl)    Equipment Recommendations  None recommended by PT    Recommendations for Other Services       Precautions / Restrictions Precautions Precautions: Fall Restrictions Weight Bearing Restrictions: No      Mobility  Bed Mobility Overal bed mobility: Independent             General bed mobility comments: Pt able to relatively easily   Transfers Overall transfer level: Modified independent Equipment used: None             General transfer comment: Pt was able to rise w/o hesitation, did display stooped posture  Ambulation/Gait Ambulation/Gait assistance: Supervision Gait Distance (Feet): 200 Feet Assistive device: None       General Gait Details: Pt was able to circumambulate the nurses' station with good confidence though he did have some small stagger steps and inconsistency with cadence.  Pt showed good  effort, vitals remained relatively stable  Stairs Stairs: Yes Stairs assistance: Supervision   Number of Stairs: 5 General stair comments: Pt was able to negotiate up/down steps w/o phyiscal assist, showed good   Wheelchair Mobility    Modified Rankin (Stroke Patients Only)       Balance Overall balance assessment: Modified Independent                                           Pertinent Vitals/Pain Pain Assessment: No/denies pain    Home Living Family/patient expects to be discharged to:: Private residence Living Arrangements: Spouse/significant other     Home Access: Stairs to enter       Home Equipment: None      Prior Function Level of Independence: Independent         Comments: Pt reports he is able to drive, be relatively active, does not need AD     Hand Dominance        Extremity/Trunk Assessment   Upper Extremity Assessment Upper Extremity Assessment: Overall WFL for tasks assessed(no focal deficits or symmetry)    Lower Extremity Assessment Lower Extremity Assessment: Overall WFL for tasks assessed(no focal deficits or symmetry)       Communication   Communication: No difficulties  Cognition Arousal/Alertness: Awake/alert Behavior During Therapy: Impulsive;WFL for tasks assessed/performed Overall Cognitive Status: Within Functional Limits for tasks assessed  General Comments      Exercises     Assessment/Plan    PT Assessment Patient needs continued PT services  PT Problem List Decreased strength;Decreased activity tolerance;Decreased range of motion;Decreased balance;Decreased mobility;Decreased coordination;Decreased cognition;Decreased knowledge of use of DME;Decreased safety awareness       PT Treatment Interventions Gait training;Therapeutic exercise;Therapeutic activities;Stair training;Functional mobility training;Balance training;Neuromuscular  re-education;Patient/family education    PT Goals (Current goals can be found in the Care Plan section)  Acute Rehab PT Goals Patient Stated Goal: Go home PT Goal Formulation: With patient Time For Goal Achievement: 10/17/18 Potential to Achieve Goals: Good    Frequency 7X/week   Barriers to discharge        Co-evaluation               AM-PAC PT "6 Clicks" Mobility  Outcome Measure Help needed turning from your back to your side while in a flat bed without using bedrails?: None Help needed moving from lying on your back to sitting on the side of a flat bed without using bedrails?: None Help needed moving to and from a bed to a chair (including a wheelchair)?: None Help needed standing up from a chair using your arms (e.g., wheelchair or bedside chair)?: None Help needed to walk in hospital room?: A Little Help needed climbing 3-5 steps with a railing? : A Little 6 Click Score: 22    End of Session Equipment Utilized During Treatment: Gait belt Activity Tolerance: Patient tolerated treatment well Patient left: with chair alarm set;with call bell/phone within reach Nurse Communication: Mobility status PT Visit Diagnosis: Difficulty in walking, not elsewhere classified (R26.2);Muscle weakness (generalized) (M62.81)    Time: 9675-9163 PT Time Calculation (min) (ACUTE ONLY): 25 min   Charges:   PT Evaluation $PT Eval Low Complexity: 1 Low PT Treatments $Gait Training: 8-22 mins        Kreg Shropshire, DPT 10/03/2018, 10:40 AM

## 2018-10-03 NOTE — Progress Notes (Signed)
Patient given Ativan as ordered and assisted back to bed. Patient is now drowsy and behavior is appropriate. Madlyn Frankel, RN
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
## 2018-10-04 ENCOUNTER — Inpatient Hospital Stay: Payer: Medicare HMO

## 2018-10-04 LAB — CBC
HCT: 40.7 % (ref 39.0–52.0)
Hemoglobin: 13.6 g/dL (ref 13.0–17.0)
MCH: 32.4 pg (ref 26.0–34.0)
MCHC: 33.4 g/dL (ref 30.0–36.0)
MCV: 96.9 fL (ref 80.0–100.0)
Platelets: 177 10*3/uL (ref 150–400)
RBC: 4.2 MIL/uL — ABNORMAL LOW (ref 4.22–5.81)
RDW: 11.7 % (ref 11.5–15.5)
WBC: 3.9 10*3/uL — ABNORMAL LOW (ref 4.0–10.5)
nRBC: 0 % (ref 0.0–0.2)

## 2018-10-04 LAB — BASIC METABOLIC PANEL
Anion gap: 7 (ref 5–15)
BUN: 19 mg/dL (ref 8–23)
CO2: 26 mmol/L (ref 22–32)
Calcium: 9.1 mg/dL (ref 8.9–10.3)
Chloride: 108 mmol/L (ref 98–111)
Creatinine, Ser: 1.61 mg/dL — ABNORMAL HIGH (ref 0.61–1.24)
GFR calc Af Amer: 50 mL/min — ABNORMAL LOW (ref >60)
GFR calc non Af Amer: 43 mL/min — ABNORMAL LOW (ref >60)
Glucose, Bld: 67 mg/dL — ABNORMAL LOW (ref 70–99)
Potassium: 3.7 mmol/L (ref 3.5–5.1)
Sodium: 141 mmol/L (ref 135–145)

## 2018-10-04 LAB — GLUCOSE, CAPILLARY
Glucose-Capillary: 56 mg/dL — ABNORMAL LOW (ref 70–99)
Glucose-Capillary: 94 mg/dL (ref 70–99)
Glucose-Capillary: 161 mg/dL — ABNORMAL HIGH (ref 70–99)
Glucose-Capillary: 190 mg/dL — ABNORMAL HIGH (ref 70–99)
Glucose-Capillary: 206 mg/dL — ABNORMAL HIGH (ref 70–99)
Glucose-Capillary: 170 mg/dL — ABNORMAL HIGH (ref 70–99)

## 2018-10-04 LAB — HIV ANTIBODY (ROUTINE TESTING W REFLEX): HIV Screen 4th Generation wRfx: NONREACTIVE

## 2018-10-04 MED ORDER — LORAZEPAM 2 MG/ML IJ SOLN
1.0000 mg | Freq: Once | INTRAMUSCULAR | Status: AC
  Administered 2018-10-04: 20:00:00 1 mg via INTRAVENOUS
  Filled 2018-10-04: qty 1

## 2018-10-04 NOTE — TOC Initial Note (Signed)
Transition of Care [facility]) - Initial/Assessment Note    Patient Details  Name: Steven Ingram MRN: [id number] Date of Birth: May 21, 1950  [facility] CM/SW Contact:    Steven Maudlin, RN Phone Number: 10/04/2018, 10:26 AM  Clinical Narrative: TOC consulted to assist with transition of care. Patient is being reccommended home health. Patient originally refused but he agrees that if home health will allow him to leave the hospital sooner he agrees. Writer also contacted spouse Steven Ingram, as patient is confused and has been agitated. Spouse is agreeable to home health as she thinks patients overall condition has worsened. CMS Medicare.gov Compare Post Acute Care list reviewed with patient and they have no preference of agency. [facility]. No DME needs, patient already has a cane. Spouse provides transport. PCP is at [facility].                   Expected Discharge Plan: [place] Barriers to Discharge: Continued Medical Work up   Patient Goals and CMS Choice Patient states their goals for this hospitalization and ongoing recovery are:: i hate this hospital they need to let me go CMS Medicare.gov Compare Post Acute Care list provided to:: Patient Represenative (must comment)(spouse) Choice offered to / list presented to : Spouse  Expected Discharge Plan and Services Expected Discharge Plan: [place]   Discharge Planning Services: CM Consult Post Acute Care Choice: Home Health                             HH Arranged: PT, OT, Speech Therapy [place] Agency: [place] ([facility])   Time HH Agency Contacted: 92 Representative spoke with at [place]: Steven Ingram  Prior Living Arrangements/Services   Lives with:: Spouse Patient language and need for interpreter reviewed:: Yes Do you feel safe going back to the place where you live?: Yes      Need for Family Participation in Patient Care:  Yes (Comment)(pt confused)     Criminal Activity/Legal Involvement Pertinent to Current Situation/Hospitalization: No - Comment as needed  Activities of Daily Living Home Assistive Devices/Equipment: None ADL Screening (condition at time of admission) Patient's cognitive ability adequate to safely complete daily activities?: Yes Is the patient deaf or have difficulty hearing?: No Does the patient have difficulty seeing, even when wearing glasses/contacts?: No Does the patient have difficulty concentrating, remembering, or making decisions?: Yes Patient able to express need for assistance with ADLs?: Yes Does the patient have difficulty dressing or bathing?: No Independently performs ADLs?: Yes (appropriate for developmental age) Does the patient have difficulty walking or climbing stairs?: No Weakness of Legs: None Weakness of Arms/Hands: None  Permission Sought/Granted Permission sought to share information with : Case Manager                Emotional Assessment Appearance:: Disheveled Attitude/Demeanor/Rapport: Inconsistent Affect (typically observed): Defensive Orientation: : Oriented to Self, Oriented to Place      Admission diagnosis:  Dyspnea [R06.00] Subjective vision disturbance, left eye [H53.10] Altered mental status, unspecified altered mental status type [R41.82] Cerebrovascular accident (CVA), unspecified mechanism ([place]) [I63.9] Patient Active Problem List   Diagnosis Date Noted  . CVA (cerebral vascular accident) ([place]) 10/03/2018   PCP:  [place], [place]:   [id number] [place] [place] [address] Phone: [phone] Fax:  [phone]     Social Determinants of Health (SDOH) Interventions    Readmission Risk Interventions Readmission Risk Prevention Plan 10/04/2018  Post Dischage Appt Not Complete  Medication Screening Complete  Transportation Screening Complete

## 2018-10-04 NOTE — Plan of Care (Signed)
  Problem: Education: Goal: Knowledge of disease or condition will improve Outcome: Progressing Goal: Knowledge of secondary prevention will improve Outcome: Progressing   Problem: Coping: Goal: Will identify appropriate support needs Outcome: Progressing   Problem: Self-Care: Goal: Ability to participate in self-care as condition permits will improve Outcome: Progressing Goal: Ability to communicate needs accurately will improve Outcome: Progressing   Problem: Nutrition: Goal: Risk of aspiration will decrease Outcome: Progressing   Problem: Clinical Measurements: Goal: Ability to maintain clinical measurements within normal limits will improve Outcome: Progressing Goal: Will remain free from infection Outcome: Progressing Goal: Diagnostic test results will improve Outcome: Progressing   Problem: Pain Managment: Goal: General experience of comfort will improve Outcome: Progressing   Problem: Safety: Goal: Ability to remain free from injury will improve Outcome: Progressing   Problem: Skin Integrity: Goal: Risk for impaired skin integrity will decrease Outcome: Progressing
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
## 2018-10-04 NOTE — Progress Notes (Signed)
[facility] [place] NAME: Steven Ingram    MR#:  [id number]  DATE OF BIRTH:  October 29, 1950  SUBJECTIVE:   he presented to the hospital due to altered mental status and confusion and had a MRI of the brain which was positive for a Subcentimeter acute/early subacute infarctions within left posterior external capsule and right posteromedial pons.  Patient clinically denies any complaints presently.  He is somewhat confused and altered.  REVIEW OF SYSTEMS:    Review of Systems  Unable to perform ROS: Mental acuity    Nutrition: Heart Healthy/Carb modified Tolerating Diet: Yes Tolerating PT: Eval noted.    DRUG ALLERGIES:  No Known Allergies  VITALS:  Blood pressure (!) 147/73, pulse (!) 58, temperature (!) 97.4 F (36.3 C), resp. rate 16, height 5\' 9"  (1.753 m), weight 94.3 kg, SpO2 99 %.  PHYSICAL EXAMINATION:   Physical Exam  GENERAL:  68 y.o.-year-old patient sitting up in bed in no acute distress.  EYES: Pupils equal, round, reactive to light and accommodation. No scleral icterus. Extraocular muscles intact.  HEENT: Head atraumatic, normocephalic. Oropharynx and nasopharynx clear.  NECK:  Supple, no jugular venous distention. No thyroid enlargement, no tenderness.  LUNGS: Normal breath sounds bilaterally, no wheezing, rales, rhonchi. No use of accessory muscles of respiration.  CARDIOVASCULAR: S1, S2 normal. No murmurs, rubs, or gallops.  ABDOMEN: Soft, nontender, nondistended. Bowel sounds present. No organomegaly or mass.  EXTREMITIES: No cyanosis, clubbing or edema b/l.    NEUROLOGIC: Cranial nerves II through XII are intact. No focal Motor or sensory deficits b/l.   PSYCHIATRIC: The patient is alert and oriented x 2.  SKIN: No obvious rash, lesion, or ulcer.    LABORATORY PANEL:   CBC Recent Labs  Lab 10/04/18 0549  WBC 3.9*  HGB 13.6  HCT 40.7  PLT 177    ------------------------------------------------------------------------------------------------------------------  Chemistries  Recent Labs  Lab 10/02/18 1932  10/04/18 0549  NA 140  --  141  K 4.2  --  3.7  CL 104  --  108  CO2 26  --  26  GLUCOSE 122*  --  67*  BUN 21  --  19  CREATININE 2.14*   < > 1.61*  CALCIUM 9.6  --  9.1  AST 17  --   --   ALT 14  --   --   ALKPHOS 43  --   --   BILITOT 1.2  --   --    < > = values in this interval not displayed.   ------------------------------------------------------------------------------------------------------------------  Cardiac Enzymes Recent Labs  Lab 10/02/18 1932  TROPONINI 0.03*   ------------------------------------------------------------------------------------------------------------------  RADIOLOGY:  Dg Chest 2 View  Result Date: 10/03/2018 CLINICAL DATA:  68 y/o  M; dyspnea. EXAM: CHEST - 2 VIEW COMPARISON:  10/02/2018 chest radiograph. FINDINGS: Stable cardiac silhouette within normal limits given projection and technique. Aortic atherosclerosis with calcification. Mild bronchitic changes in the lung bases. No focal consolidation. No pleural effusion or pneumothorax. No acute osseous abnormality is evident. Surgical clips project over the right upper quadrant. IMPRESSION: Mild bronchitic changes in the lung bases. No focal consolidation. Electronically Signed   By: Kristine Garbe M.D.   On: 10/03/2018 02:03   Ct Head Wo Contrast  Result Date: 10/02/2018 CLINICAL DATA:  Altered level consciousness.  Fall 2 days ago. EXAM: CT HEAD WITHOUT CONTRAST TECHNIQUE: Contiguous axial images were obtained from the base of the skull through the vertex without  intravenous contrast. COMPARISON:  None. FINDINGS: Brain: Mild atrophy. Mild chronic ischemia in the thalamus bilaterally. Hypodensity left frontal white matter consistent with chronic ischemia. Negative for acute infarct, hemorrhage, or mass lesion. Vascular:  Negative for hyperdense vessel Skull: Negative for skull fracture. Sinuses/Orbits: Negative Other: Soft tissue swelling left parietal scalp most compatible with recent contusion. IMPRESSION: No acute intracranial abnormality.  Chronic ischemic changes. Left parietal scalp contusion. Electronically Signed   By: Franchot Gallo M.D.   On: 10/02/2018 18:50   Mr Angio Head Wo Contrast  Result Date: 10/04/2018 CLINICAL DATA:  Stroke EXAM: MRA HEAD WITHOUT CONTRAST MRA NECK WITHOUT CONTRAST TECHNIQUE: Angiographic images of the Circle of Willis were obtained using MRA technique without intravenous contrast. Angiographic images of the neck were obtained using MRA technique without intravenous contrast. Carotid stenosis measurements (when applicable) are obtained utilizing NASCET criteria, using the distal internal carotid diameter as the denominator. COMPARISON:  MRI head 10/02/2018 FINDINGS: MRA HEAD FINDINGS Right vertebral dominant and patent to the basilar. Right PICA patent. Small left vertebral artery ends in PICA. Basilar widely patent. Superior cerebellar and posterior cerebral arteries patent bilaterally. Fetal origin right posterior cerebral artery. Atherosclerotic irregularity and mild stenosis in the cavernous carotid bilaterally. Anterior and middle cerebral arteries patent bilaterally without stenosis. Negative for cerebral aneurysm. MRA NECK FINDINGS Carotid bifurcation widely patent bilaterally without stenosis Right vertebral artery dominant and widely patent. Small left vertebral artery appears to be diffusely diseased and ends in PICA. IMPRESSION: Negative for intracranial large vessel occlusion. 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 Right vertebral widely patent No significant carotid stenosis in the neck. Electronically Signed   By: Franchot Gallo M.D.   On: 10/04/2018 14:51   Mr Jodene Nam Neck  Wo Contrast  Result Date: 10/04/2018 CLINICAL DATA:  Stroke EXAM: MRA HEAD WITHOUT CONTRAST MRA NECK WITHOUT CONTRAST TECHNIQUE: Angiographic images of the Circle of Willis were obtained using MRA technique without intravenous contrast. Angiographic images of the neck were obtained using MRA technique without intravenous contrast. Carotid stenosis measurements (when applicable) are obtained utilizing NASCET criteria, using the distal internal carotid diameter as the denominator. COMPARISON:  MRI head 10/02/2018 FINDINGS: MRA HEAD FINDINGS Right vertebral dominant and patent to the basilar. Right PICA patent. Small left vertebral artery ends in PICA. Basilar widely patent. Superior cerebellar and posterior cerebral arteries patent bilaterally. Fetal origin right posterior cerebral artery. Atherosclerotic irregularity and mild stenosis in the cavernous carotid bilaterally. Anterior and middle cerebral arteries patent bilaterally without stenosis. Negative for cerebral aneurysm. MRA NECK FINDINGS Carotid bifurcation widely patent bilaterally without stenosis Right vertebral artery dominant and widely patent. Small left vertebral artery appears to be diffusely diseased and ends in PICA. IMPRESSION: Negative for intracranial large vessel occlusion. 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 Right vertebral widely patent No significant carotid stenosis in the neck. Electronically Signed   By: Franchot Gallo M.D.   On: 10/04/2018 14:51   Mr Brain Wo Contrast  Result Date: 10/02/2018 CLINICAL DATA:  68 y/o  M; confusion, unsteady, slurred speech. EXAM: MRI HEAD WITHOUT CONTRAST TECHNIQUE: Multiplanar, multiecho pulse sequences of the brain and surrounding structures were obtained without intravenous contrast. COMPARISON:  10/02/2018 CT head. FINDINGS: Brain: Subcentimeter foci of reduced diffusion within the left  posterior external capsule and right posteromedial pons (series  3 image 18 and 27) compatible with acute/early subacute infarction. Infarcts are T2 FLAIR hyperintense. No hemorrhage or mass effect. Early confluent nonspecific T2 FLAIR hyperintensities in subcortical and periventricular white matter as well as the pons are compatible with moderate chronic microvascular ischemic changes. Moderate volume loss of the brain. Very small chronic infarcts are present within the bilateral thalami, left anterior corona radiata, and the pons. 11 mm dural nodule over the left posterior frontal convexity (series 9, image 20 and series 12, image 16). No mass effect on the brain. Vascular: Normal flow voids. Skull and upper cervical spine: Normal marrow signal. Sinuses/Orbits: Negative. Other: None. IMPRESSION: 1. Subcentimeter acute/early subacute infarctions within left posterior external capsule and right posteromedial pons. No hemorrhage or mass effect. 2. Moderate chronic microvascular ischemic changes and volume loss of the brain. 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 3. 11 mm dural nodule over the left posterior frontal convexity, likely meningioma. These results were called by telephone at the time of interpretation on 10/02/2018 at 9:29 pm to Dr. Conni Slipper , who verbally acknowledged these results. Electronically Signed   By: Kristine Garbe M.D.   On: 10/02/2018 21:30   US Carotid Bilateral (at [facility] And [facility] Only)  Result Date: 10/03/2018 CLINICAL DATA:  Left eye visual disturbance, hypertension, diabetes, small acute left external capsule and right pons infarcts EXAM: BILATERAL CAROTID DUPLEX ULTRASOUND TECHNIQUE: Pearline Cables scale imaging, color Doppler and duplex ultrasound were performed of bilateral carotid and vertebral arteries in the neck. COMPARISON:  10/02/2018 MRI FINDINGS: Criteria: Quantification of carotid stenosis is based on velocity parameters that correlate the residual  internal carotid diameter with NASCET-based stenosis levels, using the diameter of the distal internal carotid lumen as the denominator for stenosis measurement. The following velocity measurements were obtained: RIGHT ICA: 105/12 cm/sec CCA: 71/0 cm/sec SYSTOLIC ICA/CCA RATIO:  1.1 ECA: 105 cm/sec LEFT ICA: 80/11 cm/sec CCA: 626/9 cm/sec SYSTOLIC ICA/CCA RATIO:  0.8 ECA: 92 cm/sec RIGHT CAROTID ARTERY: Moderate diffuse intimal thickening and mixed echogenicity atherosclerosis. No hemodynamically significant right ICA stenosis, velocity elevation, or turbulent flow. Degree of narrowing less than 50%. RIGHT VERTEBRAL ARTERY:  Antegrade LEFT CAROTID ARTERY: Similar moderate diffuse intimal thickening and mixed echogenicity atherosclerosis. No hemodynamically significant left ICA stenosis, velocity elevation, or turbulent flow. LEFT VERTEBRAL ARTERY:  Antegrade IMPRESSION: Moderate carotid atherosclerosis. No hemodynamically significant ICA stenosis. Degree of narrowing less than 50% bilaterally by ultrasound criteria. Patent antegrade vertebral flow bilaterally Electronically Signed   By: Jerilynn Mages.  Shick M.D.   On: 10/03/2018 13:06   Dg Chest Portable 1 View  Result Date: 10/02/2018 CLINICAL DATA:  Altered mental status EXAM: PORTABLE CHEST 1 VIEW COMPARISON:  None. FINDINGS: The heart size and mediastinal contours are within normal limits. Both lungs are clear. The visualized skeletal structures are unremarkable. IMPRESSION: No active disease. Electronically Signed   By: Donavan Foil M.D.   On: 10/02/2018 19:36     ASSESSMENT AND PLAN:   68 year old male with past medical history of hypertension, hyperlipidemia, diabetes who presented to the hospital due to altered mental status/confusion.  1.  Altered mental status/confusion-patient presented to the hospital due to the above complaints.  Patient's MRI showed a subacute/acute infarction in left posterior external capsule in the right posterior medial pons. -  Continue aspirin.  Mental status slightly improved. - Given midbrain CVA await MRA of the brain and neck.  Appreciate neurology consult. Pt was refusing yesterday and agreed today. - PT evaluation noted, await OT  evaluation, patient is tolerating p.o. well. -Lipid panel is at goal and continue atorvastatin.  2.  Essential hypertension-continue Norvasc, Lisinopril.   3. Acute Renal failure - likely pre-renal in nature. Cr. Is improving slightly. Cr. In March'20 was 1.3 in care everywhere.  - cont. To monitor. Taking PO well and will d/c IV fluids for now. Follow BUN/Cr.   4. DM type II - cont. SSI, Glipizide, hold Metformin due to ARF.    5. GERD - cont. Protonix.   6. BPH - cont. Flomax.    All the records are reviewed and case discussed with Care Management/Social Worker. Management plans discussed with the patient, family and they are in agreement.  CODE STATUS: Full code  DVT Prophylaxis: Lovenox  TOTAL TIME TAKING CARE OF THIS PATIENT: 32 minutes.   POSSIBLE D/C IN 1-2 DAYS, DEPENDING ON CLINICAL CONDITION.   Vaughan Basta M.D on 10/04/2018 at 4:49 PM  Between 7am to 6pm - Pager - [phone]  After 6pm go to [email or web address] - [employer] [facility]  Office  [phone]  CC: Primary care physician; [employer]
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
## 2018-10-04 NOTE — Progress Notes (Signed)
Physical Therapy Treatment Patient Details Name: Steven Ingram MRN: [id number] DOB: 1950-08-26 Today's Date: 10/04/2018    History of Present Illness 68 y/o male here with L sided head ache, eye pain and found to have had small CVA.  At time of PT eval he reports feeling much better than on arrival and near his baseline.     PT Comments    Independent with bed mobility.  Stood and was able to ambulate x 1 around nursing unit with min guard.  Pt reaching for rails when available for balance and walking very close to the wall and running into objects at times and knocking wall phones off the wall.  Did not know he was in the hospital or in [place].  After seated rest, walker was tried for balance.  He was able to walk 3 laps around unit with min guard and overall improved gait but still needed cues to avoid objects.  Overall progressing well.  Pt may do well in his home with ambulation and "furniture cruising" but in outside or community mobility he will benefit from a rolling walker for safety.   Follow Up Recommendations  Home health PT;Supervision - Intermittent     Equipment Recommendations  Rolling walker with 5" wheels    Recommendations for Other Services       Precautions / Restrictions Precautions Precautions: Fall Restrictions Weight Bearing Restrictions: No    Mobility  Bed Mobility Overal bed mobility: Independent                Transfers Overall transfer level: Modified independent                  Ambulation/Gait Ambulation/Gait assistance: Supervision;Min guard Gait Distance (Feet): 700 Feet Assistive device: Rolling walker (2 wheeled);None Gait Pattern/deviations: Step-through pattern;Decreased step length - right;Decreased step length - left;Trunk flexed;Drifts right/left Gait velocity: decreased   General Gait Details: 1 lap with no AD, 3 laps with RW - improved gait   Stairs             Wheelchair Mobility    Modified Rankin  (Stroke Patients Only)       Balance Overall balance assessment: Needs assistance Sitting-balance support: Feet supported Sitting balance-Leahy Scale: Good     Standing balance support: No upper extremity supported Standing balance-Leahy Scale: Fair Standing balance comment: unsteady at times, reaching for rails in hallway when able for balance                            Cognition Arousal/Alertness: Awake/alert Behavior During Therapy: WFL for tasks assessed/performed Overall Cognitive Status: No family/caregiver present to determine baseline cognitive functioning                                 General Comments: Confused.  Suprised when I told him he was in the hospital and in [place].      Exercises Other Exercises Other Exercises: standing ex - heel raises, SLR, marches x 10, sidestepping left and right    General Comments        Pertinent Vitals/Pain Pain Assessment: No/denies pain    Home Living                      Prior Function            PT Goals (current goals can now be found  in the care plan section) Progress towards PT goals: Progressing toward goals    Frequency           PT Plan Current plan remains appropriate    Co-evaluation              AM-PAC PT "6 Clicks" Mobility   Outcome Measure  Help needed turning from your back to your side while in a flat bed without using bedrails?: None Help needed moving from lying on your back to sitting on the side of a flat bed without using bedrails?: None Help needed moving to and from a bed to a chair (including a wheelchair)?: None Help needed standing up from a chair using your arms (e.g., wheelchair or bedside chair)?: None Help needed to walk in hospital room?: A Little Help needed climbing 3-5 steps with a railing? : A Little 6 Click Score: 22    End of Session Equipment Utilized During Treatment: Gait belt Activity Tolerance: Patient tolerated  treatment well Patient left: with chair alarm set;with call bell/phone within reach Nurse Communication: Mobility status       Time: 0827-0850 PT Time Calculation (min) (ACUTE ONLY): 23 min  Charges:  $Gait Training: 8-22 mins $Therapeutic Exercise: 8-22 mins                     Chesley Noon, PTA 10/04/18, 9:28 AM

## 2018-10-05 LAB — GLUCOSE, CAPILLARY
Glucose-Capillary: 126 mg/dL — ABNORMAL HIGH (ref 70–99)
Glucose-Capillary: 116 mg/dL — ABNORMAL HIGH (ref 70–99)
Glucose-Capillary: 165 mg/dL — ABNORMAL HIGH (ref 70–99)
Glucose-Capillary: 112 mg/dL — ABNORMAL HIGH (ref 70–99)

## 2018-10-05 LAB — BASIC METABOLIC PANEL
Anion gap: 8 (ref 5–15)
BUN: 16 mg/dL (ref 8–23)
CO2: 27 mmol/L (ref 22–32)
Calcium: 9.2 mg/dL (ref 8.9–10.3)
Chloride: 102 mmol/L (ref 98–111)
Creatinine, Ser: 1.3 mg/dL — ABNORMAL HIGH (ref 0.61–1.24)
GFR calc Af Amer: >60 mL/min (ref >60)
GFR calc non Af Amer: 56 mL/min — ABNORMAL LOW (ref >60)
Glucose, Bld: 134 mg/dL — ABNORMAL HIGH (ref 70–99)
Potassium: 3.3 mmol/L — ABNORMAL LOW (ref 3.5–5.1)
Sodium: 137 mmol/L (ref 135–145)

## 2018-10-05 LAB — TROPONIN I: Troponin I: 0.08 ng/mL (ref <0.03)

## 2018-10-05 LAB — MAGNESIUM: Magnesium: 1.6 mg/dL — ABNORMAL LOW (ref 1.7–2.4)

## 2018-10-05 MED ORDER — POTASSIUM CHLORIDE CRYS ER 20 MEQ PO TBCR
40.0000 meq | EXTENDED_RELEASE_TABLET | Freq: Once | ORAL | Status: AC
  Administered 2018-10-05: 40 meq via ORAL
  Filled 2018-10-05: qty 2

## 2018-10-05 MED ORDER — SODIUM CHLORIDE 0.9 % IV SOLN
INTRAVENOUS | Status: DC | PRN
  Administered 2018-10-05: 250 mL via INTRAVENOUS

## 2018-10-05 MED ORDER — DILTIAZEM HCL 25 MG/5ML IV SOLN
10.0000 mg | INTRAVENOUS | Status: DC
  Filled 2018-10-05: qty 5

## 2018-10-05 MED ORDER — MAGNESIUM SULFATE 2 GM/50ML IV SOLN
2.0000 g | Freq: Once | INTRAVENOUS | Status: AC
  Administered 2018-10-05: 2 g via INTRAVENOUS
  Filled 2018-10-05: qty 50

## 2018-10-05 MED ORDER — PANTOPRAZOLE SODIUM 40 MG PO TBEC
40.0000 mg | DELAYED_RELEASE_TABLET | Freq: Every day | ORAL | Status: DC
  Administered 2018-10-06: 40 mg via ORAL
  Filled 2018-10-05: qty 1

## 2018-10-05 MED ORDER — LORAZEPAM 2 MG/ML IJ SOLN
0.5000 mg | INTRAMUSCULAR | Status: DC | PRN
  Administered 2018-10-05 (×3): 0.5 mg via INTRAVENOUS
  Filled 2018-10-05 (×3): qty 1

## 2018-10-05 MED ORDER — TRAZODONE HCL 50 MG PO TABS
25.0000 mg | ORAL_TABLET | Freq: Every evening | ORAL | Status: DC | PRN
  Administered 2018-10-05 (×2): 25 mg via ORAL
  Filled 2018-10-05 (×2): qty 1

## 2018-10-05 MED ORDER — DILTIAZEM HCL 30 MG PO TABS
60.0000 mg | ORAL_TABLET | Freq: Three times a day (TID) | ORAL | Status: DC
  Administered 2018-10-05 – 2018-10-07 (×7): 60 mg via ORAL
  Filled 2018-10-05 (×7): qty 2

## 2018-10-05 NOTE — Progress Notes (Addendum)
[name] [place] NAME: Steven Ingram    MR#:  [id number]  DATE OF BIRTH:  Aug 06, 1950  SUBJECTIVE:   he presented to the hospital due to altered mental status and confusion and had a MRI of the brain which was positive for a Subcentimeter acute/early subacute infarctions within left posterior external capsule and right posteromedial pons.  Patient clinically denies any complaints presently.  He is somewhat confused and altered.  Patient had episodes of tachycardia today but his vitals were stable.  REVIEW OF SYSTEMS:    Review of Systems  Unable to perform ROS: Mental acuity    Nutrition: Heart Healthy/Carb modified Tolerating Diet: Yes Tolerating PT: Eval noted.    DRUG ALLERGIES:  No Known Allergies  VITALS:  Blood pressure (!) 147/80, pulse 91, temperature 98.4 F (36.9 C), temperature source Oral, resp. rate 18, height 5\' 9"  (1.753 m), weight 94.3 kg, SpO2 99 %.  PHYSICAL EXAMINATION:   Physical Exam  GENERAL:  68 y.o.-year-old patient sitting up in bed in no acute distress.  EYES: Pupils equal, round, reactive to light and accommodation. No scleral icterus. Extraocular muscles intact.  HEENT: Head atraumatic, normocephalic. Oropharynx and nasopharynx clear.  NECK:  Supple, no jugular venous distention. No thyroid enlargement, no tenderness.  LUNGS: Normal breath sounds bilaterally, no wheezing, rales, rhonchi. No use of accessory muscles of respiration.  CARDIOVASCULAR: S1, S2 normal. No murmurs, rubs, or gallops.  ABDOMEN: Soft, nontender, nondistended. Bowel sounds present. No organomegaly or mass.  EXTREMITIES: No cyanosis, clubbing or edema b/l.    NEUROLOGIC: Cranial nerves II through XII are intact. No focal Motor or sensory deficits b/l.   PSYCHIATRIC: The patient is alert and oriented x 1.  SKIN: No obvious rash, lesion, or ulcer.    LABORATORY PANEL:   CBC Recent Labs  Lab 10/04/18 0549  WBC 3.9*   HGB 13.6  HCT 40.7  PLT 177   ------------------------------------------------------------------------------------------------------------------  Chemistries  Recent Labs  Lab 10/02/18 1932  10/05/18 0943  NA 140   < > 137  K 4.2   < > 3.3*  CL 104   < > 102  CO2 26   < > 27  GLUCOSE 122*   < > 134*  BUN 21   < > 16  CREATININE 2.14*   < > 1.30*  CALCIUM 9.6   < > 9.2  MG  --   --  1.6*  AST 17  --   --   ALT 14  --   --   ALKPHOS 43  --   --   BILITOT 1.2  --   --    < > = values in this interval not displayed.   ------------------------------------------------------------------------------------------------------------------  Cardiac Enzymes Recent Labs  Lab 10/05/18 0943  TROPONINI 0.08*   ------------------------------------------------------------------------------------------------------------------  RADIOLOGY:  Mr Angio Head Wo Contrast  Result Date: 10/04/2018 CLINICAL DATA:  Stroke EXAM: MRA HEAD WITHOUT CONTRAST MRA NECK WITHOUT CONTRAST TECHNIQUE: Angiographic images of the Circle of Willis were obtained using MRA technique without intravenous contrast. Angiographic images of the neck were obtained using MRA technique without intravenous contrast. Carotid stenosis measurements (when applicable) are obtained utilizing NASCET criteria, using the distal internal carotid diameter as the denominator. COMPARISON:  MRI head 10/02/2018 FINDINGS: MRA HEAD FINDINGS Right vertebral dominant and patent to the basilar. Right PICA patent. Small left vertebral artery ends in PICA. Basilar widely patent. Superior cerebellar and posterior cerebral arteries patent bilaterally.  Fetal origin right posterior cerebral artery. Atherosclerotic irregularity and mild stenosis in the cavernous carotid bilaterally. Anterior and middle cerebral arteries patent bilaterally without stenosis. Negative for cerebral aneurysm. MRA NECK FINDINGS Carotid bifurcation widely patent bilaterally without  stenosis Right vertebral artery dominant and widely patent. Small left vertebral artery appears to be diffusely diseased and ends in PICA. IMPRESSION: Negative for intracranial large vessel occlusion. 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 Right vertebral widely patent No significant carotid stenosis in the neck. Electronically Signed   By: Franchot Gallo M.D.   On: 10/04/2018 14:51   Mr Jodene Nam Neck Wo Contrast  Result Date: 10/04/2018 CLINICAL DATA:  Stroke EXAM: MRA HEAD WITHOUT CONTRAST MRA NECK WITHOUT CONTRAST TECHNIQUE: Angiographic images of the Circle of Willis were obtained using MRA technique without intravenous contrast. Angiographic images of the neck were obtained using MRA technique without intravenous contrast. Carotid stenosis measurements (when applicable) are obtained utilizing NASCET criteria, using the distal internal carotid diameter as the denominator. COMPARISON:  MRI head 10/02/2018 FINDINGS: MRA HEAD FINDINGS Right vertebral dominant and patent to the basilar. Right PICA patent. Small left vertebral artery ends in PICA. Basilar widely patent. Superior cerebellar and posterior cerebral arteries patent bilaterally. Fetal origin right posterior cerebral artery. Atherosclerotic irregularity and mild stenosis in the cavernous carotid bilaterally. Anterior and middle cerebral arteries patent bilaterally without stenosis. Negative for cerebral aneurysm. MRA NECK FINDINGS Carotid bifurcation widely patent bilaterally without stenosis Right vertebral artery dominant and widely patent. Small left vertebral artery appears to be diffusely diseased and ends in PICA. IMPRESSION: Negative for intracranial large vessel occlusion. 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 Right vertebral widely  patent No significant carotid stenosis in the neck. Electronically Signed   By: Franchot Gallo M.D.   On: 10/04/2018 14:51     ASSESSMENT AND PLAN:   68 year old male with past medical history of hypertension, hyperlipidemia, diabetes who presented to the hospital due to altered mental status/confusion.  1.  Altered mental status/confusion-patient presented to the hospital due to the above complaints.  Patient's MRI showed a subacute/acute infarction in left posterior external capsule in the right posterior medial pons. - Continue aspirin.  Mental status slightly improved. - Given midbrain CVA done MRA- no major stenosis.  Appreciate neurology consult. - PT evaluation noted, await OT evaluation, patient is tolerating p.o. well. -Lipid panel is at goal and continue atorvastatin. -Due to suspected embolic nature of the stroke cardiology consult was called in and they decided to do TEE.  2.  Essential hypertension-continue Norvasc, Lisinopril.   3. Acute Renal failure - likely pre-renal in nature. Cr. Is improving slightly. Cr. In March'20 was 1.3 in care everywhere.  - cont. To monitor. Taking PO well and will d/c IV fluids for now. Follow BUN/Cr.   4. DM type II - cont. SSI, Glipizide, hold Metformin due to ARF.    5. GERD - cont. Protonix.   6. BPH - cont. Flomax.    All the records are reviewed and case discussed with Care Management/Social Worker. Management plans discussed with the patient, family and they are in agreement.  CODE STATUS: Full code  DVT Prophylaxis: Lovenox  TOTAL TIME TAKING CARE OF THIS PATIENT: 32 minutes.  I spoke to patient's wife on phone today.  POSSIBLE D/C IN 1-2 DAYS,  DEPENDING ON CLINICAL CONDITION.   Vaughan Basta M.D on 10/05/2018 at 2:22 PM  Between 7am to 6pm - Pager - [phone]  After 6pm go to [email or web address] - Technical brewer [facility]  Office  [phone]  CC: Primary care physician; Inc,  [employer]

## 2018-10-05 NOTE — Progress Notes (Signed)
CRITICAL VALUE ALERT  Critical Value:  Troponin 0.08  Also notified MD of serum Magnesium and Potassium levels and EKG results.  Date & Time Notied:  10/05/2018  Provider Notified: Dr Anselm Jungling  Orders Received/Actions taken: No new orders at this time.

## 2018-10-05 NOTE — Progress Notes (Signed)
PT Cancellation Note  Patient Details Name: Steven Ingram MRN: [id number] DOB: Nov 01, 1950   Cancelled Treatment:    Reason Eval/Treat Not Completed: Patient not medically ready   Session held this am due to elevated HR.  Discussed with RN and tech.  Will attempt later as appropriate and time allows.   Chesley Noon 10/05/2018, 9:10 AM

## 2018-10-05 NOTE — Progress Notes (Signed)
Patient out of bed attempting to walk into hallway, unstable on his feet, not allowing staff to assist him. Received verbal order from Dr. Sidney Ace for 0.5mg  ativan and trazodone 25 prn at bedtime.

## 2018-10-05 NOTE — Consult Note (Signed)
[facility] Cardiology  CARDIOLOGY CONSULT NOTE  Patient ID: Steven Ingram MRN: [id number] DOB/AGE: 69-12-1950 67 y.o.  Admit date: 10/02/2018 Referring Physician Anselm Jungling Primary Physician [facility] health services Primary Cardiologist  Reason for Consultation acute stroke  HPI: Patient referred for evaluation for possible transesophageal echocardiogram following acute stroke.  Admitted on 10/02/2018 with confusion x2 days.  Brain MRI revealed acute infarctions left posterior external capsule.  There was no evidence for intracranial large vessel occlusion.  ECG revealed sinus rhythm.  Telemetry has revealed predominant sinus rhythm with PVCs and ventricular couplets.  2D echocardiogram revealed normal left ventricle function, with LVEF of 60 to 65%.  Review of systems complete and found to be negative unless listed above     Past Medical History:  Diagnosis Date  . Diabetes mellitus without complication ([place])   . High cholesterol   . Hypertension     History reviewed. No pertinent surgical history.  Medications Prior to Admission  Medication Sig Dispense Refill Last Dose  . amLODipine (NORVASC) 10 MG tablet Take 10 mg by mouth daily.   10/02/2018 at 1100  . atorvastatin (LIPITOR) 40 MG tablet Take 40 mg by mouth daily.   10/02/2018 at 1100  . glipiZIDE (GLUCOTROL XL) 10 MG 24 hr tablet Take 10 mg by mouth daily with breakfast.   10/02/2018 at 1100  . lisinopril (ZESTRIL) 20 MG tablet Take 20 mg by mouth daily.   10/02/2018 at 1100  . metFORMIN (GLUCOPHAGE) 500 MG tablet Take 500 mg by mouth 2 (two) times daily with a meal.    10/02/2018 at 1100  . tamsulosin (FLOMAX) 0.4 MG CAPS capsule Take 0.4 mg by mouth 2 (two) times a day.   10/02/2018 at 1100   Social History   Socioeconomic History  . Marital status: Unknown    Spouse name: Not on file  . Number of children: Not on file  . Years of education: Not on file  . Highest education level: Not on file  Occupational History  . Not on file   Social Needs  . Financial resource strain: Not on file  . Food insecurity    Worry: Not on file    Inability: Not on file  . Transportation needs    Medical: Not on file    Non-medical: Not on file  Tobacco Use  . Smoking status: Current Every Day Smoker    Types: Cigarettes  . Smokeless tobacco: Never Used  Substance and Sexual Activity  . Alcohol use: Not Currently  . Drug use: Never  . Sexual activity: Not on file  Lifestyle  . Physical activity    Days per week: Not on file    Minutes per session: Not on file  . Stress: Not on file  Relationships  . Social Herbalist on phone: Not on file    Gets together: Not on file    Attends religious service: Not on file    Active member of club or organization: Not on file    Attends meetings of clubs or organizations: Not on file    Relationship status: Not on file  . Intimate partner violence    Fear of current or ex partner: Not on file    Emotionally abused: Not on file    Physically abused: Not on file    Forced sexual activity: Not on file  Other Topics Concern  . Not on file  Social History Narrative  . Not on file    History reviewed.  No pertinent family history.    Review of systems complete and found to be negative unless listed above      PHYSICAL EXAM  General: Well developed, well nourished, in no acute distress HEENT:  Normocephalic and atramatic Neck:  No JVD.  Lungs: Clear bilaterally to auscultation and percussion. Heart: HRRR . Normal S1 and S2 without gallops or murmurs.  Abdomen: Bowel sounds are positive, abdomen soft and non-tender  Msk:  Back normal, normal gait. Normal strength and tone for age. Extremities: No clubbing, cyanosis or edema.   Neuro: Alert and oriented X 3. Psych:  Good affect, responds appropriately  Labs:   Lab Results  Component Value Date   WBC 3.9 (L) 10/04/2018   HGB 13.6 10/04/2018   HCT 40.7 10/04/2018   MCV 96.9 10/04/2018   PLT 177 10/04/2018     Recent Labs  Lab 10/02/18 1932  10/04/18 0549  NA 140  --  141  K 4.2  --  3.7  CL 104  --  108  CO2 26  --  26  BUN 21  --  19  CREATININE 2.14*   < > 1.61*  CALCIUM 9.6  --  9.1  PROT 6.8  --   --   BILITOT 1.2  --   --   ALKPHOS 43  --   --   ALT 14  --   --   AST 17  --   --   GLUCOSE 122*  --  67*   < > = values in this interval not displayed.   Lab Results  Component Value Date   TROPONINI 0.03 [facility]) 10/02/2018    Lab Results  Component Value Date   CHOL 122 10/03/2018   Lab Results  Component Value Date   HDL 42 10/03/2018   Lab Results  Component Value Date   LDLCALC 65 10/03/2018   Lab Results  Component Value Date   TRIG 76 10/03/2018   Lab Results  Component Value Date   CHOLHDL 2.9 10/03/2018   No results found for: LDLDIRECT    Radiology: Dg Chest 2 View  Result Date: 10/03/2018 CLINICAL DATA:  68 y/o  M; dyspnea. EXAM: CHEST - 2 VIEW COMPARISON:  10/02/2018 chest radiograph. FINDINGS: Stable cardiac silhouette within normal limits given projection and technique. Aortic atherosclerosis with calcification. Mild bronchitic changes in the lung bases. No focal consolidation. No pleural effusion or pneumothorax. No acute osseous abnormality is evident. Surgical clips project over the right upper quadrant. IMPRESSION: Mild bronchitic changes in the lung bases. No focal consolidation. Electronically Signed   By: Kristine Garbe M.D.   On: 10/03/2018 02:03   Ct Head Wo Contrast  Result Date: 10/02/2018 CLINICAL DATA:  Altered level consciousness.  Fall 2 days ago. EXAM: CT HEAD WITHOUT CONTRAST TECHNIQUE: Contiguous axial images were obtained from the base of the skull through the vertex without intravenous contrast. COMPARISON:  None. FINDINGS: Brain: Mild atrophy. Mild chronic ischemia in the thalamus bilaterally. Hypodensity left frontal white matter consistent with chronic ischemia. Negative for acute infarct, hemorrhage, or mass lesion. Vascular:  Negative for hyperdense vessel Skull: Negative for skull fracture. Sinuses/Orbits: Negative Other: Soft tissue swelling left parietal scalp most compatible with recent contusion. IMPRESSION: No acute intracranial abnormality.  Chronic ischemic changes. Left parietal scalp contusion. Electronically Signed   By: Franchot Gallo M.D.   On: 10/02/2018 18:50   Mr Angio Head Wo Contrast  Result Date: 10/04/2018 CLINICAL DATA:  Stroke EXAM: MRA HEAD  WITHOUT CONTRAST MRA NECK WITHOUT CONTRAST TECHNIQUE: Angiographic images of the Circle of Willis were obtained using MRA technique without intravenous contrast. Angiographic images of the neck were obtained using MRA technique without intravenous contrast. Carotid stenosis measurements (when applicable) are obtained utilizing NASCET criteria, using the distal internal carotid diameter as the denominator. COMPARISON:  MRI head 10/02/2018 FINDINGS: MRA HEAD FINDINGS Right vertebral dominant and patent to the basilar. Right PICA patent. Small left vertebral artery ends in PICA. Basilar widely patent. Superior cerebellar and posterior cerebral arteries patent bilaterally. Fetal origin right posterior cerebral artery. Atherosclerotic irregularity and mild stenosis in the cavernous carotid bilaterally. Anterior and middle cerebral arteries patent bilaterally without stenosis. Negative for cerebral aneurysm. MRA NECK FINDINGS Carotid bifurcation widely patent bilaterally without stenosis Right vertebral artery dominant and widely patent. Small left vertebral artery appears to be diffusely diseased and ends in PICA. IMPRESSION: Negative for intracranial large vessel occlusion. 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 Right vertebral widely patent No significant carotid stenosis in the neck. Electronically Signed   By: Franchot Gallo M.D.   On: 10/04/2018 14:51   Mr Jodene Nam Neck  Wo Contrast  Result Date: 10/04/2018 CLINICAL DATA:  Stroke EXAM: MRA HEAD WITHOUT CONTRAST MRA NECK WITHOUT CONTRAST TECHNIQUE: Angiographic images of the Circle of Willis were obtained using MRA technique without intravenous contrast. Angiographic images of the neck were obtained using MRA technique without intravenous contrast. Carotid stenosis measurements (when applicable) are obtained utilizing NASCET criteria, using the distal internal carotid diameter as the denominator. COMPARISON:  MRI head 10/02/2018 FINDINGS: MRA HEAD FINDINGS Right vertebral dominant and patent to the basilar. Right PICA patent. Small left vertebral artery ends in PICA. Basilar widely patent. Superior cerebellar and posterior cerebral arteries patent bilaterally. Fetal origin right posterior cerebral artery. Atherosclerotic irregularity and mild stenosis in the cavernous carotid bilaterally. Anterior and middle cerebral arteries patent bilaterally without stenosis. Negative for cerebral aneurysm. MRA NECK FINDINGS Carotid bifurcation widely patent bilaterally without stenosis Right vertebral artery dominant and widely patent. Small left vertebral artery appears to be diffusely diseased and ends in PICA. IMPRESSION: Negative for intracranial large vessel occlusion. 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 Right vertebral widely patent No significant carotid stenosis in the neck. Electronically Signed   By: Franchot Gallo M.D.   On: 10/04/2018 14:51   Mr Brain Wo Contrast  Result Date: 10/02/2018 CLINICAL DATA:  68 y/o  M; confusion, unsteady, slurred speech. EXAM: MRI HEAD WITHOUT CONTRAST TECHNIQUE: Multiplanar, multiecho pulse sequences of the brain and surrounding structures were obtained without intravenous contrast. COMPARISON:  10/02/2018 CT head. FINDINGS: Brain: Subcentimeter foci of reduced diffusion within the left  posterior external capsule and right posteromedial pons (series 3 image 18 and 27) compatible with acute/early subacute infarction. Infarcts are T2 FLAIR hyperintense. No hemorrhage or mass effect. Early confluent nonspecific T2 FLAIR hyperintensities in subcortical and periventricular white matter as well as the pons are compatible with moderate chronic microvascular ischemic changes. Moderate volume loss of the brain. Very small chronic infarcts are present within the bilateral thalami, left anterior corona radiata, and the pons. 11 mm dural nodule over the left posterior frontal convexity (series 9, image 20 and series 12, image 16). No mass effect on the brain. Vascular: Normal flow voids. Skull and upper cervical spine: Normal  marrow signal. Sinuses/Orbits: Negative. Other: None. IMPRESSION: 1. Subcentimeter acute/early subacute infarctions within left posterior external capsule and right posteromedial pons. No hemorrhage or mass effect. 2. Moderate chronic microvascular ischemic changes and volume loss of the brain. 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 3. 11 mm dural nodule over the left posterior frontal convexity, likely meningioma. These results were called by telephone at the time of interpretation on 10/02/2018 at 9:29 pm to Dr. Conni Slipper , who verbally acknowledged these results. Electronically Signed   By: Kristine Garbe M.D.   On: 10/02/2018 21:30   US Carotid Bilateral (at [facility] And [facility] Only)  Result Date: 10/03/2018 CLINICAL DATA:  Left eye visual disturbance, hypertension, diabetes, small acute left external capsule and right pons infarcts EXAM: BILATERAL CAROTID DUPLEX ULTRASOUND TECHNIQUE: Pearline Cables scale imaging, color Doppler and duplex ultrasound were performed of bilateral carotid and vertebral arteries in the neck. COMPARISON:  10/02/2018 MRI FINDINGS: Criteria: Quantification of carotid stenosis is based on velocity parameters that correlate the residual  internal carotid diameter with NASCET-based stenosis levels, using the diameter of the distal internal carotid lumen as the denominator for stenosis measurement. The following velocity measurements were obtained: RIGHT ICA: 105/12 cm/sec CCA: 63/8 cm/sec SYSTOLIC ICA/CCA RATIO:  1.1 ECA: 105 cm/sec LEFT ICA: 80/11 cm/sec CCA: 466/5 cm/sec SYSTOLIC ICA/CCA RATIO:  0.8 ECA: 92 cm/sec RIGHT CAROTID ARTERY: Moderate diffuse intimal thickening and mixed echogenicity atherosclerosis. No hemodynamically significant right ICA stenosis, velocity elevation, or turbulent flow. Degree of narrowing less than 50%. RIGHT VERTEBRAL ARTERY:  Antegrade LEFT CAROTID ARTERY: Similar moderate diffuse intimal thickening and mixed echogenicity atherosclerosis. No hemodynamically significant left ICA stenosis, velocity elevation, or turbulent flow. LEFT VERTEBRAL ARTERY:  Antegrade IMPRESSION: Moderate carotid atherosclerosis. No hemodynamically significant ICA stenosis. Degree of narrowing less than 50% bilaterally by ultrasound criteria. Patent antegrade vertebral flow bilaterally Electronically Signed   By: Jerilynn Mages.  Shick M.D.   On: 10/03/2018 13:06   Dg Chest Portable 1 View  Result Date: 10/02/2018 CLINICAL DATA:  Altered mental status EXAM: PORTABLE CHEST 1 VIEW COMPARISON:  None. FINDINGS: The heart size and mediastinal contours are within normal limits. Both lungs are clear. The visualized skeletal structures are unremarkable. IMPRESSION: No active disease. Electronically Signed   By: Donavan Foil M.D.   On: 10/02/2018 19:36    EKG: Sinus rhythm  ASSESSMENT AND PLAN:   1.  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  Agree with current therapy 2.  Continue to monitor telemetry 3.  TEE to be scheduled  Signed: Isaias Cowman MD,PhD, [facility] 10/05/2018, 9:36 AM

## 2018-10-05 NOTE — Progress Notes (Signed)
Patient out of bed attempting to walk into hallway, unstable on his feet, not allowing staff to assist him. Received verbal order from Gardiner Barefoot NP for ativan 1mg  one time.

## 2018-10-05 NOTE — Progress Notes (Signed)
Around 1859 noted pt's HR up to 200 Vtach for few seconds on the monitor in the nursing station. This Probation officer checked on pt. Pt did not seem to be in distress at that time. VSS and pt denied chest pain. Pt is confused and was trying to use the urinal. The urinal was on his chest under the hospital gown. Dr Anselm Jungling made aware and present in pt's room. MD to place orders. Also notified MD of pt having slight L facial asymmetry, aphasia, and dysarthria. Per MD pt had aphasia and dysarthria yesterday as well.

## 2018-10-05 NOTE — Progress Notes (Signed)
PHARMACIST - PHYSICIAN COMMUNICATION  CONCERNING: IV to Oral Route Change Policy  RECOMMENDATION: This patient is receiving pantoprazole by the intravenous route.  Based on criteria approved by the Pharmacy and Therapeutics Committee, the intravenous medication(s) is/are being converted to the equivalent oral dose form(s).   DESCRIPTION: These criteria include:  The patient is eating (either orally or via tube) and/or has been taking other orally administered medications for a least 24 hours  The patient has no evidence of active gastrointestinal bleeding or impaired GI absorption (gastrectomy, short bowel, patient on TNA or NPO).  If you have questions about this conversion, please contact the Pharmacy Department  []   [phone] )  [place], [facility] 10/05/2018 3:33 PM

## 2018-10-06 DIAGNOSIS — I639 Cerebral infarction, unspecified: Principal | ICD-10-CM

## 2018-10-06 DIAGNOSIS — R4182 Altered mental status, unspecified: Secondary | ICD-10-CM

## 2018-10-06 LAB — BASIC METABOLIC PANEL
Anion gap: 12 (ref 5–15)
BUN: 17 mg/dL (ref 8–23)
CO2: 23 mmol/L (ref 22–32)
Calcium: 8.9 mg/dL (ref 8.9–10.3)
Chloride: 104 mmol/L (ref 98–111)
Creatinine, Ser: 1.26 mg/dL — ABNORMAL HIGH (ref 0.61–1.24)
GFR calc Af Amer: >60 mL/min (ref >60)
GFR calc non Af Amer: 58 mL/min — ABNORMAL LOW (ref >60)
Glucose, Bld: 108 mg/dL — ABNORMAL HIGH (ref 70–99)
Potassium: 3.2 mmol/L — ABNORMAL LOW (ref 3.5–5.1)
Sodium: 139 mmol/L (ref 135–145)

## 2018-10-06 LAB — MAGNESIUM: Magnesium: 1.7 mg/dL (ref 1.7–2.4)

## 2018-10-06 LAB — GLUCOSE, CAPILLARY
Glucose-Capillary: 125 mg/dL — ABNORMAL HIGH (ref 70–99)
Glucose-Capillary: 132 mg/dL — ABNORMAL HIGH (ref 70–99)
Glucose-Capillary: 127 mg/dL — ABNORMAL HIGH (ref 70–99)
Glucose-Capillary: 171 mg/dL — ABNORMAL HIGH (ref 70–99)
Glucose-Capillary: 118 mg/dL — ABNORMAL HIGH (ref 70–99)

## 2018-10-06 LAB — TROPONIN I: Troponin I: 0.05 ng/mL (ref <0.03)

## 2018-10-06 MED ORDER — MAGNESIUM SULFATE 2 GM/50ML IV SOLN
2.0000 g | Freq: Once | INTRAVENOUS | Status: AC
  Administered 2018-10-06: 2 g via INTRAVENOUS
  Filled 2018-10-06: qty 50

## 2018-10-06 MED ORDER — POTASSIUM CHLORIDE CRYS ER 20 MEQ PO TBCR
40.0000 meq | EXTENDED_RELEASE_TABLET | Freq: Two times a day (BID) | ORAL | Status: AC
  Administered 2018-10-06 (×2): 40 meq via ORAL
  Filled 2018-10-06 (×2): qty 2

## 2018-10-06 NOTE — Progress Notes (Signed)
Physical Therapy Treatment Patient Details Name: Steven Ingram MRN: [id number] DOB: 17-May-1950 Today's Date: 10/06/2018    History of Present Illness 68 y/o male here with L sided head ache, eye pain and found to have had small CVA.  At time of PT eval he reports feeling much better than on arrival and near his baseline.     PT Comments    Pt awake this pm, ready to walk.  To edge of bed with some increased effort but no assist.  Stood with min guard/assist +1 and was able to walk  2 laps around unit with walker and min guard/assist.  Occasional minor imbalances and decreased gait speed.  He also has some awareness issues when navigating walker and needs verbal cues to avoid obstacles.  Wanted to remain up in recliner.  Sitter in room.    Follow Up Recommendations  Home health PT;Supervision - Intermittent     Equipment Recommendations  Rolling walker with 5" wheels    Recommendations for Other Services       Precautions / Restrictions Precautions Precautions: Fall Restrictions Weight Bearing Restrictions: No    Mobility  Bed Mobility Overal bed mobility: Needs Assistance Bed Mobility: Supine to Sit     Supine to sit: Min guard     General bed mobility comments: more effort today but no assist needed.  Transfers Overall transfer level: Needs assistance Equipment used: Rolling walker (2 wheeled) Transfers: Sit to/from Stand Sit to Stand: Min guard            Ambulation/Gait Ambulation/Gait assistance: Min assist;Min guard Gait Distance (Feet): 340 Feet Assistive device: Rolling walker (2 wheeled) Gait Pattern/deviations: Step-through pattern;Decreased step length - right;Decreased step length - left;Drifts right/left Gait velocity: decreased   General Gait Details: Reliant on RW.  Difficulty navigating it at times requiring verbal and tactile cues.   Stairs             Wheelchair Mobility    Modified Rankin (Stroke Patients Only)        Balance Overall balance assessment: Needs assistance Sitting-balance support: Feet supported Sitting balance-Leahy Scale: Good     Standing balance support: No upper extremity supported;Bilateral upper extremity supported Standing balance-Leahy Scale: Fair Standing balance comment: reliant on walker for balanc, Occasional imbalances recovered with min a and vc's                            Cognition Arousal/Alertness: Awake/alert Behavior During Therapy: WFL for tasks assessed/performed Overall Cognitive Status: No family/caregiver present to determine baseline cognitive functioning                                        Exercises      General Comments        Pertinent Vitals/Pain Pain Assessment: No/denies pain    Home Living                      Prior Function            PT Goals (current goals can now be found in the care plan section) Progress towards PT goals: Progressing toward goals    Frequency    7X/week      PT Plan Current plan remains appropriate    Co-evaluation  AM-PAC PT "6 Clicks" Mobility   Outcome Measure  Help needed turning from your back to your side while in a flat bed without using bedrails?: None Help needed moving from lying on your back to sitting on the side of a flat bed without using bedrails?: None Help needed moving to and from a bed to a chair (including a wheelchair)?: A Little Help needed standing up from a chair using your arms (e.g., wheelchair or bedside chair)?: A Little Help needed to walk in hospital room?: A Little Help needed climbing 3-5 steps with a railing? : A Little 6 Click Score: 20    End of Session Equipment Utilized During Treatment: Gait belt Activity Tolerance: Patient tolerated treatment well Patient left: in chair;with nursing/sitter in room;with call bell/phone within reach         Time: 0231-0247 PT Time Calculation (min) (ACUTE ONLY): 16  min  Charges:  $Gait Training: 8-22 mins                     Chesley Noon, PTA 10/06/18, 3:52 PM

## 2018-10-06 NOTE — Care Management Important Message (Signed)
Important Message  Patient Details  Name: Lew Prout MRN: [id number] Date of Birth: 1950-07-05   Medicare Important Message Given:  Yes     Juliann Pulse A Eniya Cannady 10/06/2018, 11:08 AM

## 2018-10-06 NOTE — Progress Notes (Signed)
PT Cancellation Note  Patient Details Name: Steven Ingram MRN: [id number] DOB: 03/15/1951   Cancelled Treatment:    Reason Eval/Treat Not Completed: Fatigue/lethargy limiting ability to participate   Attempted x 3 this am.  Pt asleep.  Sitter stated he was given medication that has made him tired.  Will continue as appropriate.    Chesley Noon 10/06/2018, 11:18 AM

## 2018-10-06 NOTE — Progress Notes (Signed)
[facility] Cardiology  SUBJECTIVE: The patient denies chest pain, shortness of breath, or palpitations. He seems lethargic this morning.   Vitals:   10/05/18 1529 10/05/18 2041 10/06/18 0420 10/06/18 0936  BP: (!) 180/97 (!) 137/98 (!) 157/89 107/70  Pulse: 99 83 84 84  Resp:  18 18 18   Temp:  98.3 F (36.8 C) 97.7 F (36.5 C) 98.3 F (36.8 C)  TempSrc:    Oral  SpO2:  97% 95% 96%  Weight:      Height:         Intake/Output Summary (Last 24 hours) at 10/06/2018 1108 Last data filed at 10/06/2018 0600 Gross per 24 hour  Intake 589.11 ml  Output 650 ml  Net -60.89 ml      PHYSICAL EXAM  General: Well developed, well nourished, in no acute distress, lying in bed  HEENT:  Normocephalic and atramatic Neck:  No JVD.  Lungs: Clear bilaterally to auscultation, normal effort of breathing on room air. Heart: HRRR . Normal S1 and S2 without gallops or murmurs.  Abdomen: nondistended Msk:  Back normal, gait not assessed. No obvious deformity. Extremities: No clubbing, cyanosis or edema.   Neuro: lethargic, easily aroused, answering yes or no questions    LABS: Basic Metabolic Panel: Recent Labs    10/05/18 0943 10/06/18 0411  NA 137 139  K 3.3* 3.2*  CL 102 104  CO2 27 23  GLUCOSE 134* 108*  BUN 16 17  CREATININE 1.30* 1.26*  CALCIUM 9.2 8.9  MG 1.6* 1.7   Liver Function Tests: No results for input(s): AST, ALT, ALKPHOS, BILITOT, PROT, ALBUMIN in the last 72 hours. No results for input(s): LIPASE, AMYLASE in the last 72 hours. CBC: Recent Labs    10/04/18 0549  WBC 3.9*  HGB 13.6  HCT 40.7  MCV 96.9  PLT 177   Cardiac Enzymes: Recent Labs    10/05/18 0943 10/06/18 0411  TROPONINI 0.08* 0.05*   BNP: Invalid input(s): POCBNP D-Dimer: No results for input(s): DDIMER in the last 72 hours. Hemoglobin A1C: No results for input(s): HGBA1C in the last 72 hours. Fasting Lipid Panel: No results for input(s): CHOL, HDL, LDLCALC, TRIG, CHOLHDL, LDLDIRECT in the  last 72 hours. Thyroid Function Tests: No results for input(s): TSH, T4TOTAL, T3FREE, THYROIDAB in the last 72 hours.  Invalid input(s): FREET3 Anemia Panel: No results for input(s): VITAMINB12, FOLATE, FERRITIN, TIBC, IRON, RETICCTPCT in the last 72 hours.  Mr Angio Head Wo Contrast  Result Date: 10/04/2018 CLINICAL DATA:  Stroke EXAM: MRA HEAD WITHOUT CONTRAST MRA NECK WITHOUT CONTRAST TECHNIQUE: Angiographic images of the Circle of Willis were obtained using MRA technique without intravenous contrast. Angiographic images of the neck were obtained using MRA technique without intravenous contrast. Carotid stenosis measurements (when applicable) are obtained utilizing NASCET criteria, using the distal internal carotid diameter as the denominator. COMPARISON:  MRI head 10/02/2018 FINDINGS: MRA HEAD FINDINGS Right vertebral dominant and patent to the basilar. Right PICA patent. Small left vertebral artery ends in PICA. Basilar widely patent. Superior cerebellar and posterior cerebral arteries patent bilaterally. Fetal origin right posterior cerebral artery. Atherosclerotic irregularity and mild stenosis in the cavernous carotid bilaterally. Anterior and middle cerebral arteries patent bilaterally without stenosis. Negative for cerebral aneurysm. MRA NECK FINDINGS Carotid bifurcation widely patent bilaterally without stenosis Right vertebral artery dominant and widely patent. Small left vertebral artery appears to be diffusely diseased and ends in PICA. IMPRESSION: Negative for intracranial large vessel occlusion. 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 Right vertebral widely patent No significant carotid stenosis in the neck. Electronically Signed   By: Franchot Gallo M.D.   On: 10/04/2018 14:51   Mr Jodene Nam Neck Wo Contrast  Result Date: 10/04/2018 CLINICAL DATA:  Stroke EXAM: MRA HEAD WITHOUT  CONTRAST MRA NECK WITHOUT CONTRAST TECHNIQUE: Angiographic images of the Circle of Willis were obtained using MRA technique without intravenous contrast. Angiographic images of the neck were obtained using MRA technique without intravenous contrast. Carotid stenosis measurements (when applicable) are obtained utilizing NASCET criteria, using the distal internal carotid diameter as the denominator. COMPARISON:  MRI head 10/02/2018 FINDINGS: MRA HEAD FINDINGS Right vertebral dominant and patent to the basilar. Right PICA patent. Small left vertebral artery ends in PICA. Basilar widely patent. Superior cerebellar and posterior cerebral arteries patent bilaterally. Fetal origin right posterior cerebral artery. Atherosclerotic irregularity and mild stenosis in the cavernous carotid bilaterally. Anterior and middle cerebral arteries patent bilaterally without stenosis. Negative for cerebral aneurysm. MRA NECK FINDINGS Carotid bifurcation widely patent bilaterally without stenosis Right vertebral artery dominant and widely patent. Small left vertebral artery appears to be diffusely diseased and ends in PICA. IMPRESSION: Negative for intracranial large vessel occlusion. 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 Right vertebral widely patent No significant carotid stenosis in the neck. Electronically Signed   By: Franchot Gallo M.D.   On: 10/04/2018 14:51     Echo EF 60-65%  TELEMETRY: sinus rhythm, PVCs  ASSESSMENT AND PLAN:  Active Problems:   CVA (cerebral vascular accident) ([place])    1. Acute stroke, left posterior external capsule and right posteromedial pons, suspected embolic source. Normal 2D echocardiogram.  Plan: 1. Agree with current therapy 2. Plan for TEE in the morning by Dr. Nolene Ebbs, PA-C 10/06/2018 11:08 AM

## 2018-10-06 NOTE — Progress Notes (Signed)
Subjective: Pt appears to be confused today, but following commands. No clear focality    Past Medical History:  Diagnosis Date  . Diabetes mellitus without complication ([place])   . High cholesterol   . Hypertension     History reviewed. No pertinent surgical history.  History reviewed. No pertinent family history. Social History:  reports that he has been smoking cigarettes. He has never used smokeless tobacco. He reports previous alcohol use. He reports that he does not use drugs.  Allergies: No Known Allergies  Medications Prior to Admission  Medication Sig Dispense Refill  . amLODipine (NORVASC) 10 MG tablet Take 10 mg by mouth daily.    Marland Kitchen atorvastatin (LIPITOR) 40 MG tablet Take 40 mg by mouth daily.    Marland Kitchen glipiZIDE (GLUCOTROL XL) 10 MG 24 hr tablet Take 10 mg by mouth daily with breakfast.    . lisinopril (ZESTRIL) 20 MG tablet Take 20 mg by mouth daily.    . metFORMIN (GLUCOPHAGE) 500 MG tablet Take 500 mg by mouth 2 (two) times daily with a meal.     . tamsulosin (FLOMAX) 0.4 MG CAPS capsule Take 0.4 mg by mouth 2 (two) times a day.      ROS: Per HPI  Physical Examination: Blood pressure 107/70, pulse 84, temperature 98.3 F (36.8 C), temperature source Oral, resp. rate 18, height 5\' 9"  (1.753 m), weight 94.3 kg, SpO2 96 %.  HEENT-  Normocephalic, no lesions, without obvious abnormality.  Normal external eye and conjunctiva.  Normal TM's bilaterally.  Normal auditory canals and external ears. Normal external nose, mucus membranes and septum.  Normal pharynx. Neck supple with no masses, nodes, nodules or enlargement. Cardiovascular - regular rate and rhythm, S1, S2 normal, no murmur, click, rub or gallop Lungs - chest clear, no wheezing, rales, normal symmetric air entry, Heart exam - S1, S2 normal, no murmur, no gallop, rate regular Abdomen - soft, non-tender; bowel sounds normal; no masses,  no organomegaly Extremities - no edema  Neurologic Examination: Patient is  alert, and awake to name only today CN: PERRLA, EOMI, VfF, en gaze nystagmus on left, left facial, face sensation intact, palate and tongue midline No motor deficit appreciated No sensory deficit appreciated Gait not checked at time of examination.  Results for orders placed or performed during the hospital encounter of 10/02/18 (from the past 48 hour(s))  Glucose, capillary     Status: Abnormal   Collection Time: 10/04/18 11:32 AM  Result Value Ref Range   Glucose-Capillary 161 (H) 70 - 99 mg/dL  Glucose, capillary     Status: Abnormal   Collection Time: 10/04/18  5:16 PM  Result Value Ref Range   Glucose-Capillary 190 (H) 70 - 99 mg/dL  Glucose, capillary     Status: Abnormal   Collection Time: 10/04/18  8:04 PM  Result Value Ref Range   Glucose-Capillary 206 (H) 70 - 99 mg/dL  Glucose, capillary     Status: Abnormal   Collection Time: 10/04/18 10:32 PM  Result Value Ref Range   Glucose-Capillary 170 (H) 70 - 99 mg/dL  Glucose, capillary     Status: Abnormal   Collection Time: 10/05/18  7:32 AM  Result Value Ref Range   Glucose-Capillary 126 (H) 70 - 99 mg/dL  Basic metabolic panel     Status: Abnormal   Collection Time: 10/05/18  9:43 AM  Result Value Ref Range   Sodium 137 135 - 145 mmol/L   Potassium 3.3 (L) 3.5 - 5.1 mmol/L  Chloride 102 98 - 111 mmol/L   CO2 27 22 - 32 mmol/L   Glucose, Bld 134 (H) 70 - 99 mg/dL   BUN 16 8 - 23 mg/dL   Creatinine, Ser 1.30 (H) 0.61 - 1.24 mg/dL   Calcium 9.2 8.9 - 10.3 mg/dL   GFR calc non Af Amer 56 (L) >60 mL/min   GFR calc Af Amer >60 >60 mL/min   Anion gap 8 5 - 15    Comment: Performed at [facility], [address] [place], [place] [address]  Troponin I - ONCE - STAT     Status: Abnormal   Collection Time: 10/05/18  9:43 AM  Result Value Ref Range   Troponin I 0.08 (HH) <0.03 ng/mL    Comment: CRITICAL VALUE NOTED. VALUE IS CONSISTENT WITH PREVIOUSLY REPORTED/CALLED VALUE.PMF Performed at [facility],  [address] [place], [place] [address]   Magnesium     Status: Abnormal   Collection Time: 10/05/18  9:43 AM  Result Value Ref Range   Magnesium 1.6 (L) 1.7 - 2.4 mg/dL    Comment: Performed at [facility], [address] [place], [place] [address]  Glucose, capillary     Status: Abnormal   Collection Time: 10/05/18 11:37 AM  Result Value Ref Range   Glucose-Capillary 116 (H) 70 - 99 mg/dL  Glucose, capillary     Status: Abnormal   Collection Time: 10/05/18  4:47 PM  Result Value Ref Range   Glucose-Capillary 165 (H) 70 - 99 mg/dL  Glucose, capillary     Status: Abnormal   Collection Time: 10/05/18 11:11 PM  Result Value Ref Range   Glucose-Capillary 112 (H) 70 - 99 mg/dL  Basic metabolic panel     Status: Abnormal   Collection Time: 10/06/18  4:11 AM  Result Value Ref Range   Sodium 139 135 - 145 mmol/L   Potassium 3.2 (L) 3.5 - 5.1 mmol/L   Chloride 104 98 - 111 mmol/L   CO2 23 22 - 32 mmol/L   Glucose, Bld 108 (H) 70 - 99 mg/dL   BUN 17 8 - 23 mg/dL   Creatinine, Ser 1.26 (H) 0.61 - 1.24 mg/dL   Calcium 8.9 8.9 - 10.3 mg/dL   GFR calc non Af Amer 58 (L) >60 mL/min   GFR calc Af Amer >60 >60 mL/min   Anion gap 12 5 - 15    Comment: Performed at [facility], [place] [place], [place] [address]  Magnesium     Status: None   Collection Time: 10/06/18  4:11 AM  Result Value Ref Range   Magnesium 1.7 1.7 - 2.4 mg/dL    Comment: Performed at [facility], [address] [place], [place] [address]  Troponin I - Tomorrow AM 0500     Status: Abnormal   Collection Time: 10/06/18  4:11 AM  Result Value Ref Range   Troponin I 0.05 (HH) <0.03 ng/mL    Comment: CRITICAL VALUE NOTED. VALUE IS CONSISTENT WITH PREVIOUSLY REPORTED/CALLED VALUE AKT Performed at [facility], [address] [place],  [address]   Glucose, capillary     Status: Abnormal   Collection Time: 10/06/18  8:38 AM  Result Value Ref Range    Glucose-Capillary 125 (H) 70 - 99 mg/dL   Mr Angio Head Wo Contrast  Result Date: 10/04/2018 CLINICAL DATA:  Stroke EXAM: MRA HEAD WITHOUT CONTRAST MRA NECK WITHOUT CONTRAST TECHNIQUE: Angiographic images of the Circle of Willis  were obtained using MRA technique without intravenous contrast. Angiographic images of the neck were obtained using MRA technique without intravenous contrast. Carotid stenosis measurements (when applicable) are obtained utilizing NASCET criteria, using the distal internal carotid diameter as the denominator. COMPARISON:  MRI head 10/02/2018 FINDINGS: MRA HEAD FINDINGS Right vertebral dominant and patent to the basilar. Right PICA patent. Small left vertebral artery ends in PICA. Basilar widely patent. Superior cerebellar and posterior cerebral arteries patent bilaterally. Fetal origin right posterior cerebral artery. Atherosclerotic irregularity and mild stenosis in the cavernous carotid bilaterally. Anterior and middle cerebral arteries patent bilaterally without stenosis. Negative for cerebral aneurysm. MRA NECK FINDINGS Carotid bifurcation widely patent bilaterally without stenosis Right vertebral artery dominant and widely patent. Small left vertebral artery appears to be diffusely diseased and ends in PICA. IMPRESSION: Negative for intracranial large vessel occlusion. 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 Right vertebral widely patent No significant carotid stenosis in the neck. Electronically Signed   By: Franchot Gallo M.D.   On: 10/04/2018 14:51   Mr Jodene Nam Neck Wo Contrast  Result Date: 10/04/2018 CLINICAL DATA:  Stroke EXAM: MRA HEAD WITHOUT CONTRAST MRA NECK WITHOUT CONTRAST TECHNIQUE: Angiographic images of the Circle of Willis were obtained using MRA technique without intravenous contrast. Angiographic images of the neck were obtained using MRA technique without  intravenous contrast. Carotid stenosis measurements (when applicable) are obtained utilizing NASCET criteria, using the distal internal carotid diameter as the denominator. COMPARISON:  MRI head 10/02/2018 FINDINGS: MRA HEAD FINDINGS Right vertebral dominant and patent to the basilar. Right PICA patent. Small left vertebral artery ends in PICA. Basilar widely patent. Superior cerebellar and posterior cerebral arteries patent bilaterally. Fetal origin right posterior cerebral artery. Atherosclerotic irregularity and mild stenosis in the cavernous carotid bilaterally. Anterior and middle cerebral arteries patent bilaterally without stenosis. Negative for cerebral aneurysm. MRA NECK FINDINGS Carotid bifurcation widely patent bilaterally without stenosis Right vertebral artery dominant and widely patent. Small left vertebral artery appears to be diffusely diseased and ends in PICA. IMPRESSION: Negative for intracranial large vessel occlusion. 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 Right vertebral widely patent No significant carotid stenosis in the neck. Electronically Signed   By: Franchot Gallo M.D.   On: 10/04/2018 14:51    Assessmen 68 y.o. male with a known history of hypertension, hyperlipidemia, diabetes mellitus, admitted with  hypersomnolence over the last 2 days, mildly slurred speech and left facial droop. In whom MRI shows Subcentimeter acute/early subacute infarctions within left. Suspected embolic strokes.  Brief Vtach/bijemini on EKG. No clear other arhythmia  - Agree with TEE - Possibly anticoagulation if clots on TEE

## 2018-10-06 NOTE — Progress Notes (Signed)
[facility] [place] NAME: Steven Ingram    MR#:  [id number]  DATE OF BIRTH:  Apr 27, 1950  SUBJECTIVE:   he presented to the hospital due to altered mental status and confusion and had a MRI of the brain which was positive for a Subcentimeter acute/early subacute infarctions within left. posterior external capsule and right posteromedial pons.  Patient clinically denies any complaints presently.  He is somewhat confused and altered.  Patient had episodes of tachycardia , now stable.  REVIEW OF SYSTEMS:    Review of Systems  Unable to perform ROS: Mental acuity    Nutrition: Heart Healthy/Carb modified Tolerating Diet: Yes Tolerating PT: Eval noted.    DRUG ALLERGIES:  No Known Allergies  VITALS:  Blood pressure (!) 150/78, pulse 92, temperature 98.4 F (36.9 C), temperature source Oral, resp. rate 16, height 5\' 9"  (1.753 m), weight 94.3 kg, SpO2 98 %.  PHYSICAL EXAMINATION:   Physical Exam  GENERAL:  68 y.o.-year-old patient sitting up in bed in no acute distress.  EYES: Pupils equal, round, reactive to light and accommodation. No scleral icterus. Extraocular muscles intact.  HEENT: Head atraumatic, normocephalic. Oropharynx and nasopharynx clear.  NECK:  Supple, no jugular venous distention. No thyroid enlargement, no tenderness.  LUNGS: Normal breath sounds bilaterally, no wheezing, rales, rhonchi. No use of accessory muscles of respiration.  CARDIOVASCULAR: S1, S2 normal. No murmurs, rubs, or gallops.  ABDOMEN: Soft, nontender, nondistended. Bowel sounds present. No organomegaly or mass.  EXTREMITIES: No cyanosis, clubbing or edema b/l.    NEUROLOGIC: Cranial nerves II through XII are intact. No focal Motor or sensory deficits b/l.   PSYCHIATRIC: The patient is alert and oriented x 1.  SKIN: No obvious rash, lesion, or ulcer.    LABORATORY PANEL:   CBC Recent Labs  Lab 10/04/18 0549  WBC 3.9*  HGB 13.6  HCT  40.7  PLT 177   ------------------------------------------------------------------------------------------------------------------  Chemistries  Recent Labs  Lab 10/02/18 1932  10/06/18 0411  NA 140   < > 139  K 4.2   < > 3.2*  CL 104   < > 104  CO2 26   < > 23  GLUCOSE 122*   < > 108*  BUN 21   < > 17  CREATININE 2.14*   < > 1.26*  CALCIUM 9.6   < > 8.9  MG  --    < > 1.7  AST 17  --   --   ALT 14  --   --   ALKPHOS 43  --   --   BILITOT 1.2  --   --    < > = values in this interval not displayed.   ------------------------------------------------------------------------------------------------------------------  Cardiac Enzymes Recent Labs  Lab 10/06/18 0411  TROPONINI 0.05*   ------------------------------------------------------------------------------------------------------------------  RADIOLOGY:  No results found.   ASSESSMENT AND PLAN:   68 year old male with past medical history of hypertension, hyperlipidemia, diabetes who presented to the hospital due to altered mental status/confusion.  1.  Altered mental status/confusion-patient presented to the hospital due to the above complaints.  Patient's MRI showed a subacute/acute infarction in left posterior external capsule in the right posterior medial pons. - Continue aspirin.  Mental status slightly improved. - Given midbrain CVA done MRA- no major stenosis.  Appreciate neurology consult. - PT evaluation noted, await OT evaluation, patient is tolerating p.o. well. - Lipid panel is at goal and continue atorvastatin. - Due to suspected embolic  nature of the stroke cardiology consult was called in and they decided to do TEE, tomorrow.  2.  Essential hypertension-continue Norvasc, Lisinopril.   3. Acute Renal failure - likely pre-renal in nature. Cr. is improving slightly. Cr. In March'20 was 1.3 in care everywhere.  - cont. To monitor. Taking PO well and will d/c IV fluids for now. Follow BUN/Cr.   4. DM  type II - cont. SSI, Glipizide, hold Metformin due to ARF.    5. GERD - cont. Protonix.   6. BPH - cont. Flomax.   All the records are reviewed and case discussed with Care Management/Social Worker. Management plans discussed with the patient, family and they are in agreement.  CODE STATUS: Full code  DVT Prophylaxis: Lovenox  TOTAL TIME TAKING CARE OF THIS PATIENT: 32 minutes.  I spoke to patient's wife on phone today.  Likely discharge tomorrow after getting TEE.  POSSIBLE D/C IN 1-2 DAYS, DEPENDING ON CLINICAL CONDITION.   Vaughan Basta M.D on 10/06/2018 at 2:26 PM  Between 7am to 6pm - Pager - [phone]  After 6pm go to [email or web address] - [employer] [place] [facility]  Office  [phone]  CC: Primary care physician; [employer]

## 2018-10-07 ENCOUNTER — Encounter
Admission: EM | Disposition: A | Discharge status: Home Health | Payer: Self-pay | Source: Home / Self Care | Attending: Internal Medicine

## 2018-10-07 ENCOUNTER — Inpatient Hospital Stay
Admit: 2018-10-07 | Discharge: 2018-10-07 | Disposition: A | Discharge status: Home / Self Care | Payer: Medicare HMO | Attending: Cardiology | Admitting: Cardiology

## 2018-10-07 HISTORY — PX: TEE WITHOUT CARDIOVERSION: SHX5443

## 2018-10-07 LAB — GLUCOSE, CAPILLARY
Glucose-Capillary: 129 mg/dL — ABNORMAL HIGH (ref 70–99)
Glucose-Capillary: 128 mg/dL — ABNORMAL HIGH (ref 70–99)
Glucose-Capillary: 133 mg/dL — ABNORMAL HIGH (ref 70–99)

## 2018-10-07 LAB — BASIC METABOLIC PANEL
Anion gap: 10 (ref 5–15)
BUN: 24 mg/dL — ABNORMAL HIGH (ref 8–23)
CO2: 26 mmol/L (ref 22–32)
Calcium: 8.9 mg/dL (ref 8.9–10.3)
Chloride: 101 mmol/L (ref 98–111)
Creatinine, Ser: 1.67 mg/dL — ABNORMAL HIGH (ref 0.61–1.24)
GFR calc Af Amer: 48 mL/min — ABNORMAL LOW (ref >60)
GFR calc non Af Amer: 41 mL/min — ABNORMAL LOW (ref >60)
Glucose, Bld: 138 mg/dL — ABNORMAL HIGH (ref 70–99)
Potassium: 4.1 mmol/L (ref 3.5–5.1)
Sodium: 137 mmol/L (ref 135–145)

## 2018-10-07 LAB — MAGNESIUM: Magnesium: 2.1 mg/dL (ref 1.7–2.4)

## 2018-10-07 SURGERY — ECHOCARDIOGRAM, TRANSESOPHAGEAL
Anesthesia: Moderate Sedation

## 2018-10-07 MED ORDER — SENNA 8.6 MG PO TABS: 1.0000 | ORAL_TABLET | Freq: Two times a day (BID) | ORAL | Status: DC

## 2018-10-07 MED ORDER — ASPIRIN EC 81 MG PO TBEC: 81.0000 mg | DELAYED_RELEASE_TABLET | Freq: Every day | ORAL | 2 refills | Status: DC

## 2018-10-07 MED ORDER — LIDOCAINE VISCOUS HCL 2 % MT SOLN
OROMUCOSAL | Status: AC
  Administered 2018-10-07: 15 mL
  Filled 2018-10-07: qty 15

## 2018-10-07 MED ORDER — MIDAZOLAM HCL 2 MG/2ML IJ SOLN
INTRAMUSCULAR | Status: DC | PRN
  Administered 2018-10-07: 1 mg via INTRAVENOUS
  Administered 2018-10-07: 2 mg via INTRAVENOUS

## 2018-10-07 MED ORDER — FENTANYL CITRATE (PF) 100 MCG/2ML IJ SOLN
INTRAMUSCULAR | Status: AC
  Filled 2018-10-07: qty 2

## 2018-10-07 MED ORDER — DILTIAZEM HCL 60 MG PO TABS: 60.0000 mg | ORAL_TABLET | Freq: Three times a day (TID) | ORAL | 0 refills | Status: DC

## 2018-10-07 MED ORDER — MIDAZOLAM HCL 5 MG/5ML IJ SOLN
INTRAMUSCULAR | Status: AC
  Filled 2018-10-07: qty 5

## 2018-10-07 MED ORDER — SODIUM CHLORIDE 0.9 % IV SOLN
INTRAVENOUS | Status: DC
  Administered 2018-10-07: 11:00:00 via INTRAVENOUS

## 2018-10-07 MED ORDER — FENTANYL CITRATE (PF) 100 MCG/2ML IJ SOLN
INTRAMUSCULAR | Status: DC | PRN
  Administered 2018-10-07: 25 ug via INTRAVENOUS
  Administered 2018-10-07: 50 ug via INTRAVENOUS

## 2018-10-07 MED ORDER — BUTAMBEN-TETRACAINE-BENZOCAINE 2-2-14 % EX AERO
INHALATION_SPRAY | CUTANEOUS | Status: AC
  Administered 2018-10-07: 13:00:00
  Filled 2018-10-07: qty 5

## 2018-10-07 MED ORDER — SODIUM CHLORIDE 0.9 % IV SOLN
INTRAVENOUS | Status: DC
  Administered 2018-10-07: 12:00:00 via INTRAVENOUS

## 2018-10-07 MED ORDER — SENNOSIDES-DOCUSATE SODIUM 8.6-50 MG PO TABS: 1.0000 | ORAL_TABLET | Freq: Every evening | ORAL | 0 refills | Status: DC | PRN

## 2018-10-07 NOTE — CV Procedure (Signed)
   TRANSESOPHAGEAL ECHOCARDIOGRAM   NAME:  Steven Ingram   MRN: [id number] DOB:  Jan 15, 1951   ADMIT DATE: 10/02/2018  INDICATIONS:   PROCEDURE:   Informed consent was obtained prior to the procedure. The risks, benefits and alternatives for the procedure were discussed and the patient comprehended these risks.  Risks include, but are not limited to, cough, sore throat, vomiting, nausea, somnolence, esophageal and stomach trauma or perforation, bleeding, low blood pressure, aspiration, pneumonia, infection, trauma to the teeth and death.    After a procedural time-out, the patient was given 3 mg versed and 75 mcg fentanyl to achieve moderate sedation for 14 min.  The oropharynx was anesthetized 3 cc of topical 1% viscous lidocaine.  The transesophageal probe was inserted in the esophagus and stomach without difficulty and multiple views were obtained. I was present for the entire procedure.    COMPLICATIONS:    There were no immediate complications.  FINDINGS:  LEFT VENTRICLE: EF = 55%. No regional wall motion abnormalities.  RIGHT VENTRICLE: Normal size and function.   LEFT ATRIUM: Normal size. No thrombus  LEFT ATRIAL APPENDAGE: No thrombus.   RIGHT ATRIUM: Normal size. No thrombus  AORTIC VALVE:  Trileaflet. No vegetations or thrombus. No ai  MITRAL VALVE:    Normal. Trivial mr. No thrombus  TRICUSPID VALVE: Normal. Mild tr. No thrombus  PULMONIC VALVE: Grossly normal.  INTERATRIAL SEPTUM: No PFO or ASD. Agitated saline contrast was used.   PERICARDIUM: No effusion  DESCENDING AORTA: Mild plaque.    CONCLUSION: Normal lv function No cardiac source of emboli noted.

## 2018-10-07 NOTE — Progress Notes (Signed)
Subjective: Pt appears to be confused today, but following commands. No clear focality    Past Medical History:  Diagnosis Date  . Diabetes mellitus without complication ([place])   . High cholesterol   . Hypertension     History reviewed. No pertinent surgical history.  History reviewed. No pertinent family history. Social History:  reports that he has been smoking cigarettes. He has never used smokeless tobacco. He reports previous alcohol use. He reports that he does not use drugs.  Allergies: No Known Allergies  Medications Prior to Admission  Medication Sig Dispense Refill  . amLODipine (NORVASC) 10 MG tablet Take 10 mg by mouth daily.    Marland Kitchen atorvastatin (LIPITOR) 40 MG tablet Take 40 mg by mouth daily.    Marland Kitchen glipiZIDE (GLUCOTROL XL) 10 MG 24 hr tablet Take 10 mg by mouth daily with breakfast.    . lisinopril (ZESTRIL) 20 MG tablet Take 20 mg by mouth daily.    . metFORMIN (GLUCOPHAGE) 500 MG tablet Take 500 mg by mouth 2 (two) times daily with a meal.     . tamsulosin (FLOMAX) 0.4 MG CAPS capsule Take 0.4 mg by mouth 2 (two) times a day.      ROS: Per HPI  Physical Examination: Blood pressure 115/72, pulse 78, temperature 98 F (36.7 C), resp. rate 18, height 5\' 9"  (1.753 m), weight 94.3 kg, SpO2 100 %.  HEENT-  Normocephalic, no lesions, without obvious abnormality.  Normal external eye and conjunctiva.  Normal TM's bilaterally.  Normal auditory canals and external ears. Normal external nose, mucus membranes and septum.  Normal pharynx. Neck supple with no masses, nodes, nodules or enlargement. Cardiovascular - regular rate and rhythm, S1, S2 normal, no murmur, click, rub or gallop Lungs - chest clear, no wheezing, rales, normal symmetric air entry, Heart exam - S1, S2 normal, no murmur, no gallop, rate regular Abdomen - soft, non-tender; bowel sounds normal; no masses,  no organomegaly Extremities - no edema  Neurologic Examination: Patient is alert, and awake to name  only today CN: PERRLA, EOMI, VfF, en gaze nystagmus on left, left facial, face sensation intact, palate and tongue midline No motor deficit appreciated No sensory deficit appreciated Gait not checked at time of examination.  Results for orders placed or performed during the hospital encounter of 10/02/18 (from the past 48 hour(s))  Glucose, capillary     Status: Abnormal   Collection Time: 10/05/18 11:37 AM  Result Value Ref Range   Glucose-Capillary 116 (H) 70 - 99 mg/dL  Glucose, capillary     Status: Abnormal   Collection Time: 10/05/18  4:47 PM  Result Value Ref Range   Glucose-Capillary 165 (H) 70 - 99 mg/dL  Glucose, capillary     Status: Abnormal   Collection Time: 10/05/18 11:11 PM  Result Value Ref Range   Glucose-Capillary 112 (H) 70 - 99 mg/dL  Basic metabolic panel     Status: Abnormal   Collection Time: 10/06/18  4:11 AM  Result Value Ref Range   Sodium 139 135 - 145 mmol/L   Potassium 3.2 (L) 3.5 - 5.1 mmol/L   Chloride 104 98 - 111 mmol/L   CO2 23 22 - 32 mmol/L   Glucose, Bld 108 (H) 70 - 99 mg/dL   BUN 17 8 - 23 mg/dL   Creatinine, Ser 1.26 (H) 0.61 - 1.24 mg/dL   Calcium 8.9 8.9 - 10.3 mg/dL   GFR calc non Af Amer 58 (L) >60 mL/min   GFR calc  Af Amer >60 >60 mL/min   Anion gap 12 5 - 15    Comment: Performed at [facility], [address] [place], [place] [address]  Magnesium     Status: None   Collection Time: 10/06/18  4:11 AM  Result Value Ref Range   Magnesium 1.7 1.7 - 2.4 mg/dL    Comment: Performed at [facility], [address] [place], [place] [address]  Troponin I - Tomorrow AM 0500     Status: Abnormal   Collection Time: 10/06/18  4:11 AM  Result Value Ref Range   Troponin I 0.05 (HH) <0.03 ng/mL    Comment: CRITICAL VALUE NOTED. VALUE IS CONSISTENT WITH PREVIOUSLY REPORTED/CALLED VALUE AKT Performed at [facility], [address] [place], [place] [address]   Glucose, capillary     Status: Abnormal    Collection Time: 10/06/18  8:38 AM  Result Value Ref Range   Glucose-Capillary 125 (H) 70 - 99 mg/dL  Glucose, capillary     Status: Abnormal   Collection Time: 10/06/18 11:42 AM  Result Value Ref Range   Glucose-Capillary 132 (H) 70 - 99 mg/dL  Glucose, capillary     Status: Abnormal   Collection Time: 10/06/18  1:47 PM  Result Value Ref Range   Glucose-Capillary 127 (H) 70 - 99 mg/dL  Glucose, capillary     Status: Abnormal   Collection Time: 10/06/18  4:50 PM  Result Value Ref Range   Glucose-Capillary 171 (H) 70 - 99 mg/dL  Glucose, capillary     Status: Abnormal   Collection Time: 10/06/18  8:42 PM  Result Value Ref Range   Glucose-Capillary 118 (H) 70 - 99 mg/dL  Basic metabolic panel     Status: Abnormal   Collection Time: 10/07/18  3:25 AM  Result Value Ref Range   Sodium 137 135 - 145 mmol/L   Potassium 4.1 3.5 - 5.1 mmol/L   Chloride 101 98 - 111 mmol/L   CO2 26 22 - 32 mmol/L   Glucose, Bld 138 (H) 70 - 99 mg/dL   BUN 24 (H) 8 - 23 mg/dL   Creatinine, Ser 1.67 (H) 0.61 - 1.24 mg/dL   Calcium 8.9 8.9 - 10.3 mg/dL   GFR calc non Af Amer 41 (L) >60 mL/min   GFR calc Af Amer 48 (L) >60 mL/min   Anion gap 10 5 - 15    Comment: Performed at [facility], [address], [place], [place] [address]  Magnesium     Status: None   Collection Time: 10/07/18  3:25 AM  Result Value Ref Range   Magnesium 2.1 1.7 - 2.4 mg/dL    Comment: Performed at [facility], [address] [place], [place] [address]  Glucose, capillary     Status: Abnormal   Collection Time: 10/07/18  7:25 AM  Result Value Ref Range   Glucose-Capillary 129 (H) 70 - 99 mg/dL   No results found.  Assessmen 68 y.o. male with a known history of hypertension, hyperlipidemia, diabetes mellitus, admitted with  hypersomnolence over the last 2 days, mildly slurred speech and left facial droop. In whom MRI shows Subcentimeter acute/early subacute infarctions within left. Suspected embolic  strokes.  Brief Vtach/bijemini on EKG. No clear other arhythmia  - Awaiting TEE today  - Possibly anticoagulation if abnormalities on TEE

## 2018-10-07 NOTE — Progress Notes (Signed)
*  PRELIMINARY RESULTS* Echocardiogram Echocardiogram Transesophageal has been performed.  Sherrie Sport 10/07/2018, 1:43 PM

## 2018-10-07 NOTE — TOC Transition Note (Signed)
Transition of Care [facility]) - CM/SW Discharge Note   Patient Details  Name: Dariyon Urquilla MRN: [id number] Date of Birth: 1950-05-19  [facility]) CM/SW Contact:  Shelbie Hutching, RN Phone Number: 10/07/2018, 3:28 PM   Clinical Narrative:    Patient will discharge home with home health.  RNCM notified pt's wife [name] that patient ready for discharge, she will bring him some clothes and come to pick him up.  [place] has been arranged and they will reach out to schedule home visit.     Final next level of care: [place] Barriers to Discharge: Barriers Resolved   Patient Goals and CMS Choice Patient states their goals for this hospitalization and ongoing recovery are:: i hate this hospital they need to let me go CMS Medicare.gov Compare Post Acute Care list provided to:: Patient Represenative (must comment)(spouse) Choice offered to / list presented to : Spouse  Discharge Placement                       Discharge Plan and Services   Discharge Planning Services: CM Consult Post Acute Care Choice: Home Health                    HH Arranged: PT, OT, Nurse's Aide, Speech Therapy [place] Agency: [place] ([place]) Date [facility] Agency Contacted: 10/07/18 Time [place]: [phone] Representative spoke with at [place]: [place] (SDOH) Interventions     Readmission Risk Interventions Readmission Risk Prevention Plan 10/04/2018  Post Dischage Appt Not Complete  Medication Screening Complete  Transportation Screening Complete

## 2018-10-07 NOTE — Progress Notes (Signed)
I sent a message to Dr. Sidney Ace and asked him if I could renew the order for a 1:1 sitter for patient and he said it was okay to do so.

## 2018-10-07 NOTE — Progress Notes (Signed)
Pt is being discharged home. Discharge papers given and explained to pt's spouse Castulo Scarpelli. Mrs Lipford verbalized understanding. Meds and f/u appointments reviewed. Rx to be picked up from pharmacy. Spouse made aware.

## 2018-10-07 NOTE — Progress Notes (Signed)
PT Cancellation Note  Patient Details Name: Steven Ingram MRN: [id number] DOB: 1950/06/29   Cancelled Treatment:    Reason Eval/Treat Not Completed: Patient at procedure or test/unavailable Pt was out of room for testing.  Will try back as appropriate and as time allows.  Kreg Shropshire, DPT 10/07/2018, 1:14 PM

## 2018-10-07 NOTE — Progress Notes (Signed)
SLP Cancellation Note  Patient Details Name: Steven Ingram MRN: [id number] DOB: [birth year]   Cancelled treatment:       Reason Eval/Treat Not Completed: Patient at procedure or test/unavailable(chart reviewed; pt having procedure today(TEE) - NPO). ST services will f/u tomorrow. Recommend ongoing aspiration precautions w/ oral diet.     Orinda Kenner, MS, CCC-SLP Watson,Katherine 10/07/2018, 2:46 PM
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
## 2018-10-08 ENCOUNTER — Encounter: Payer: Self-pay | Admitting: Cardiology

## 2018-11-08 DIAGNOSIS — I69319 Unspecified symptoms and signs involving cognitive functions following cerebral infarction: Secondary | ICD-10-CM | POA: Insufficient documentation

## 2019-01-01 ENCOUNTER — Other Ambulatory Visit: Payer: Self-pay | Admitting: Gastroenterology

## 2019-01-01 DIAGNOSIS — R1310 Dysphagia, unspecified: Secondary | ICD-10-CM

## 2019-01-01 DIAGNOSIS — Z8673 Personal history of transient ischemic attack (TIA), and cerebral infarction without residual deficits: Secondary | ICD-10-CM

## 2019-02-11 ENCOUNTER — Other Ambulatory Visit: Payer: Self-pay

## 2019-02-11 ENCOUNTER — Ambulatory Visit
Admission: RE | Admit: 2019-02-11 | Discharge: 2019-02-11 | Disposition: A | Discharge status: Home / Self Care | Payer: Medicare HMO | Source: Ambulatory Visit | Attending: Gastroenterology | Admitting: Gastroenterology

## 2019-02-11 DIAGNOSIS — R1312 Dysphagia, oropharyngeal phase: Secondary | ICD-10-CM | POA: Diagnosis present

## 2019-02-11 DIAGNOSIS — Z8673 Personal history of transient ischemic attack (TIA), and cerebral infarction without residual deficits: Secondary | ICD-10-CM | POA: Insufficient documentation

## 2019-02-11 NOTE — Therapy (Signed)
[place], [place], [address] Phone: [phone]   Fax:     Modified Barium Swallow  Patient Details  Name: Steven Ingram MRN: [id number] Date of Birth: [birth year] No data recorded  Encounter Date: 02/11/2019  End of Session - 02/11/19 1320    Visit Number  1    Number of Visits  1    Date for SLP Re-Evaluation  02/11/19       Past Medical History:  Diagnosis Date  . Diabetes mellitus without complication ([place])   . High cholesterol   . Hypertension     Past Surgical History:  Procedure Laterality Date  . TEE WITHOUT CARDIOVERSION N/A 10/07/2018   Procedure: TRANSESOPHAGEAL ECHOCARDIOGRAM (TEE);  Surgeon: Teodoro Spray, MD;  Location: [facility] ORS;  Service: Cardiovascular;  Laterality: N/A;    There were no vitals filed for this visit.        Subjective: Patient behavior: (alertness, ability to follow instructions, etc.):  The patient is alert, speech is fully intelligible, and he is able to follow directions.  Chief complaint: Stroke - Left parietal subcortical periventricular infarct into external capsula + right (almost midline) pontine infarct - causing confusion, changes in personality and psychosis and delusions. Patient has known stroke risk factors of hypertension, high cholesterol, diabetes mellitus and tobacco use: Patient presented to [facility] Emergency Room on 10/02/2018 for confusion. Patient has other symptoms of headache eye pain. While there they did a stroke work up which was positive for subcentimeter acute infarctions with the left posterior external capsule and right posteromedial pons. He did not receive TPA. He states he was not taking aspirin prior to. He was discharged on aspirin. Since stroke he denies any residual symptoms. Hazel Sams has known stroke risk factors of hypertension, high cholesterol, diabetes mellitus. He reports no known  history of atrial fibrillation, sickle cell disease or trait, post-menopausal hormone replacement therapy, drug abuse, migraine with aura, periodontal disease, high CRP, elevated lipoprotein A, increased homocysteine level, peripheral vascular disease, DVT, PE, coagulopathy, Cox II inhibitor use. Post stroke dysphasia: Patient's wife states patient is having excessive saliva he is keeping in his mouth. He has some difficulty moving his tongue. Patient will cough when eating and drinking.    Objective:  Radiological Procedure: A videoflouroscopic evaluation of oral-preparatory, reflex initiation, and pharyngeal phases of the swallow was performed; as well as a screening of the upper esophageal phase.  I. POSTURE: Upright in MBS chair  II. VIEW: Lateral  III. COMPENSATORY STRATEGIES: N/A  IV. BOLUSES ADMINISTERED:   Thin Liquid: 1 small, 2 large rapid consecutive   Nectar-thick Liquid: 1 moderate   Honey-thick Liquid: DNT   Puree: 2 teaspoon presentations   Mechanical Soft: 1/4 graham cracker in applesauce  V. RESULTS OF EVALUATION: A. ORAL PREPARATORY PHASE: (The lips, tongue, and velum are observed for strength and coordination)       **Overall Severity Rating: Within functional limits  B. SWALLOW INITIATION/REFLEX: (The reflex is normal if "triggered" by the time the bolus reached the base of the tongue)  **Overall Severity Rating: Mild; triggers while falling from the valleculae to the pyriform sinuses  C. PHARYNGEAL PHASE: (Pharyngeal function is normal if the bolus shows rapid, smooth, and continuous transit through the pharynx and there is no pharyngeal residue after the swallow)  **Overall Severity Rating: Mild; reduced tongue base retraction with trace-to-mild pharyngeal residue  D. LARYNGEAL PENETRATION: (Material entering into the laryngeal inlet/vestibule  but not aspirated) Transient X1 with thin liquid  E. ASPIRATION: None  F. ESOPHAGEAL PHASE: (Screening of the upper  esophagus) :no abnormality within the viewable cervical esophagus  ASSESSMENT: This 68 year old man; S/P CVA June/2020 and reports of coughing with eating (the patient's wife told SLP primarily with meat); is presenting with mild oropharyngeal dysphagia characterized by poor dentition, delayed pharyngeal swallow initiation, reduced tongue base retraction, transient laryngeal penetration, and mild pharyngeal residue.  Given soft solid, oral control of the bolus including oral hold, mastication, and anterior to posterior transfer is within functional limits.   There is no observed tracheal aspiration.  The patient is not at risk for prandial aspiration.  The etiology of prandial coughing may be esophageal as his wife reports that coughing occurs primarily with meat.  Recommend follow up with GI.  PLAN/RECOMMENDATIONS:   A. Diet: Regular- soften/moisten as needed for easier swallowing   B. Swallowing Precautions: soften/moisten problematic foods   C. Recommended consultation to: GI   D. Therapy recommendations: speech therapy is not indicated   E. Results and recommendations were discussed with the patient and his wife immediately following the study and the final report routed to the patient's medical team.  Oropharyngeal dysphagia - Plan: DG SWALLOW FUNC OP MEDICARE SPEECH PATH, DG SWALLOW FUNC OP MEDICARE SPEECH PATH  History of stroke - Plan: DG SWALLOW FUNC OP MEDICARE SPEECH PATH, DG SWALLOW FUNC OP MEDICARE SPEECH PATH        Problem List Patient Active Problem List   Diagnosis Date Noted  . CVA (cerebral vascular accident) ([place]) 10/03/2018   [place], [place], [place] 02/11/2019, Edson Snowball PM  [place], [place], [address] Phone: [phone]   Fax:     Name: Mathew Brustad MRN: [id number] Date of Birth: October 22, 1950

## 2019-02-13 ENCOUNTER — Telehealth: Payer: Self-pay | Admitting: *Deleted

## 2019-02-13 NOTE — Telephone Encounter (Signed)
Received referral for low dose lung cancer screening CT scan. Message left at phone number listed in EMR for patient to call me back to facilitate scheduling scan.  

## 2019-02-17 ENCOUNTER — Telehealth: Payer: Self-pay | Admitting: *Deleted

## 2019-02-17 DIAGNOSIS — Z122 Encounter for screening for malignant neoplasm of respiratory organs: Secondary | ICD-10-CM

## 2019-02-17 DIAGNOSIS — Z87891 Personal history of nicotine dependence: Secondary | ICD-10-CM

## 2019-02-17 NOTE — Telephone Encounter (Signed)
Received referral for initial lung cancer screening scan. Contacted patient and obtained smoking history,(53 pack year) as well as answering questions related to screening process. Patient denies signs of lung cancer such as weight loss or hemoptysis. Patient denies comorbidity that would prevent curative treatment if lung cancer were found. Patient is scheduled for shared decision making visit and CT scan on 02/24/19 at 2pm.

## 2019-02-24 ENCOUNTER — Encounter: Payer: Self-pay | Admitting: Oncology

## 2019-02-24 ENCOUNTER — Other Ambulatory Visit: Payer: Self-pay

## 2019-02-24 ENCOUNTER — Ambulatory Visit
Admission: RE | Admit: 2019-02-24 | Discharge: 2019-02-24 | Disposition: A | Discharge status: Home / Self Care | Payer: Medicare HMO | Source: Ambulatory Visit | Attending: Oncology | Admitting: Oncology

## 2019-02-24 ENCOUNTER — Inpatient Hospital Stay: Payer: Medicare HMO | Attending: Oncology | Admitting: Oncology

## 2019-02-24 DIAGNOSIS — Z122 Encounter for screening for malignant neoplasm of respiratory organs: Secondary | ICD-10-CM | POA: Insufficient documentation

## 2019-02-24 DIAGNOSIS — Z87891 Personal history of nicotine dependence: Secondary | ICD-10-CM | POA: Diagnosis not present

## 2019-02-24 NOTE — Progress Notes (Signed)
Virtual Visit via Video Note  I connected with Steven Ingram on 02/24/19 at  2:00 PM EST by a video enabled telemedicine application and verified that I am speaking with the correct person using two identifiers.  Location: Patient: [facility] Provider: Office   I discussed the limitations of evaluation and management by telemedicine and the availability of in person appointments. The patient expressed understanding and agreed to proceed.  I discussed the assessment and treatment plan with the patient. The patient was provided an opportunity to ask questions and all were answered. The patient agreed with the plan and demonstrated an understanding of the instructions.   The patient was advised to call back or seek an in-person evaluation if the symptoms worsen or if the condition fails to improve as anticipated.   In accordance with CMS guidelines, patient has met eligibility criteria including age, absence of signs or symptoms of lung cancer.  Social History   Tobacco Use  . Smoking status: Current Every Day Smoker    Packs/day: 1.00    Years: 53.00    Pack years: 53.00    Types: Cigarettes  . Smokeless tobacco: Never Used  Substance Use Topics  . Alcohol use: Not Currently  . Drug use: Never      A shared decision-making session was conducted prior to the performance of CT scan. This includes one or more decision aids, includes benefits and harms of screening, follow-up diagnostic testing, over-diagnosis, false positive rate, and total radiation exposure.   Counseling on the importance of adherence to annual lung cancer LDCT screening, impact of co-morbidities, and ability or willingness to undergo diagnosis and treatment is imperative for compliance of the program.   Counseling on the importance of continued smoking cessation for former smokers; the importance of smoking cessation for current smokers, and information about tobacco cessation interventions have been given to patient  including [place] and 1800 quit [place] programs.   Written order for lung cancer screening with LDCT has been given to the patient and any and all questions have been answered to the best of my abilities.    Yearly follow up will be coordinated by Burgess Estelle, Thoracic Navigator.  I provided 15 minutes of face-to-face video visit time during this encounter, and > 50% was spent counseling as documented under my assessment & plan.   Jacquelin Hawking, NP

## 2019-02-26 ENCOUNTER — Encounter: Payer: Self-pay | Admitting: *Deleted

## 2019-03-19 ENCOUNTER — Encounter: Payer: Medicare HMO | Admitting: Speech Pathology

## 2019-03-23 ENCOUNTER — Ambulatory Visit: Payer: Medicare HMO | Admitting: Speech Pathology

## 2019-03-30 ENCOUNTER — Encounter: Payer: Medicare HMO | Admitting: Speech Pathology

## 2019-04-02 ENCOUNTER — Encounter: Payer: Medicare HMO | Admitting: Speech Pathology

## 2019-04-16 ENCOUNTER — Encounter: Payer: Medicare HMO | Admitting: Speech Pathology

## 2019-04-20 ENCOUNTER — Encounter: Payer: Medicare HMO | Admitting: Speech Pathology

## 2019-04-23 ENCOUNTER — Encounter: Payer: Medicare HMO | Admitting: Speech Pathology

## 2019-04-27 ENCOUNTER — Encounter: Payer: Medicare HMO | Admitting: Speech Pathology

## 2019-04-30 ENCOUNTER — Encounter: Payer: Medicare HMO | Admitting: Speech Pathology

## 2019-07-18 ENCOUNTER — Encounter: Payer: Self-pay | Admitting: *Deleted

## 2019-07-18 ENCOUNTER — Other Ambulatory Visit: Payer: Self-pay

## 2019-07-18 DIAGNOSIS — E440 Moderate protein-calorie malnutrition: Secondary | ICD-10-CM | POA: Diagnosis present

## 2019-07-18 DIAGNOSIS — I248 Other forms of acute ischemic heart disease: Secondary | ICD-10-CM | POA: Diagnosis present

## 2019-07-18 DIAGNOSIS — Z833 Family history of diabetes mellitus: Secondary | ICD-10-CM

## 2019-07-18 DIAGNOSIS — I639 Cerebral infarction, unspecified: Secondary | ICD-10-CM | POA: Diagnosis not present

## 2019-07-18 DIAGNOSIS — Z905 Acquired absence of kidney: Secondary | ICD-10-CM

## 2019-07-18 DIAGNOSIS — Z955 Presence of coronary angioplasty implant and graft: Secondary | ICD-10-CM

## 2019-07-18 DIAGNOSIS — R42 Dizziness and giddiness: Secondary | ICD-10-CM | POA: Diagnosis present

## 2019-07-18 DIAGNOSIS — E669 Obesity, unspecified: Secondary | ICD-10-CM | POA: Diagnosis present

## 2019-07-18 DIAGNOSIS — E1151 Type 2 diabetes mellitus with diabetic peripheral angiopathy without gangrene: Secondary | ICD-10-CM | POA: Diagnosis present

## 2019-07-18 DIAGNOSIS — N1831 Chronic kidney disease, stage 3a: Secondary | ICD-10-CM | POA: Diagnosis not present

## 2019-07-18 DIAGNOSIS — E7800 Pure hypercholesterolemia, unspecified: Secondary | ICD-10-CM | POA: Diagnosis present

## 2019-07-18 DIAGNOSIS — Z7982 Long term (current) use of aspirin: Secondary | ICD-10-CM

## 2019-07-18 DIAGNOSIS — N400 Enlarged prostate without lower urinary tract symptoms: Secondary | ICD-10-CM | POA: Diagnosis not present

## 2019-07-18 DIAGNOSIS — E1122 Type 2 diabetes mellitus with diabetic chronic kidney disease: Secondary | ICD-10-CM | POA: Diagnosis present

## 2019-07-18 DIAGNOSIS — R29702 NIHSS score 2: Secondary | ICD-10-CM | POA: Diagnosis not present

## 2019-07-18 DIAGNOSIS — Z7984 Long term (current) use of oral hypoglycemic drugs: Secondary | ICD-10-CM

## 2019-07-18 DIAGNOSIS — Z20822 Contact with and (suspected) exposure to covid-19: Secondary | ICD-10-CM | POA: Diagnosis not present

## 2019-07-18 DIAGNOSIS — Z6830 Body mass index (BMI) 30.0-30.9, adult: Secondary | ICD-10-CM

## 2019-07-18 DIAGNOSIS — Z79899 Other long term (current) drug therapy: Secondary | ICD-10-CM

## 2019-07-18 DIAGNOSIS — Z8249 Family history of ischemic heart disease and other diseases of the circulatory system: Secondary | ICD-10-CM | POA: Diagnosis not present

## 2019-07-18 DIAGNOSIS — I2510 Atherosclerotic heart disease of native coronary artery without angina pectoris: Secondary | ICD-10-CM | POA: Diagnosis present

## 2019-07-18 DIAGNOSIS — F17210 Nicotine dependence, cigarettes, uncomplicated: Secondary | ICD-10-CM | POA: Diagnosis not present

## 2019-07-18 DIAGNOSIS — I69398 Other sequelae of cerebral infarction: Secondary | ICD-10-CM

## 2019-07-18 DIAGNOSIS — I471 Supraventricular tachycardia: Secondary | ICD-10-CM | POA: Diagnosis not present

## 2019-07-18 DIAGNOSIS — I129 Hypertensive chronic kidney disease with stage 1 through stage 4 chronic kidney disease, or unspecified chronic kidney disease: Secondary | ICD-10-CM | POA: Diagnosis present

## 2019-07-18 LAB — CBC
HCT: 41.6 % (ref 39.0–52.0)
Hemoglobin: 14.1 g/dL (ref 13.0–17.0)
MCH: 32.9 pg (ref 26.0–34.0)
MCHC: 33.9 g/dL (ref 30.0–36.0)
MCV: 97 fL (ref 80.0–100.0)
Platelets: 183 10*3/uL (ref 150–400)
RBC: 4.29 MIL/uL (ref 4.22–5.81)
RDW: 12.1 % (ref 11.5–15.5)
WBC: 3.3 10*3/uL — ABNORMAL LOW (ref 4.0–10.5)
nRBC: 0 % (ref 0.0–0.2)

## 2019-07-18 LAB — BASIC METABOLIC PANEL
Anion gap: 11 (ref 5–15)
BUN: 13 mg/dL (ref 8–23)
CO2: 25 mmol/L (ref 22–32)
Calcium: 9.7 mg/dL (ref 8.9–10.3)
Chloride: 105 mmol/L (ref 98–111)
Creatinine, Ser: 1.5 mg/dL — ABNORMAL HIGH (ref 0.61–1.24)
GFR calc Af Amer: 54 mL/min — ABNORMAL LOW (ref >60)
GFR calc non Af Amer: 47 mL/min — ABNORMAL LOW (ref >60)
Glucose, Bld: 212 mg/dL — ABNORMAL HIGH (ref 70–99)
Potassium: 4.6 mmol/L (ref 3.5–5.1)
Sodium: 141 mmol/L (ref 135–145)

## 2019-07-18 LAB — GLUCOSE, CAPILLARY: Glucose-Capillary: 176 mg/dL — ABNORMAL HIGH (ref 70–99)

## 2019-07-18 MED ORDER — SODIUM CHLORIDE 0.9% FLUSH: 3.0000 mL | Freq: Once | INTRAVENOUS | Status: DC

## 2019-07-18 NOTE — ED Triage Notes (Signed)
Pt states he has been lightheaded all day, started after waking. Pt states he became lightheaded after rolling over in bed. @ 1000 today. Pt states episodes of lightheadedness in the past for which he did not seek medical evaluation and they resolved w/o intervention,.

## 2019-07-18 NOTE — ED Notes (Signed)
Pt updated on wait. Pt provided with call bell in subwait.

## 2019-07-19 ENCOUNTER — Encounter: Payer: Self-pay | Admitting: Internal Medicine

## 2019-07-19 ENCOUNTER — Emergency Department: Payer: Medicare Other

## 2019-07-19 ENCOUNTER — Observation Stay: Payer: Medicare Other

## 2019-07-19 ENCOUNTER — Inpatient Hospital Stay
Admission: EM | Admit: 2019-07-19 | Discharge: 2019-07-20 | DRG: 065 | Disposition: A | Discharge status: Home / Self Care | Payer: Medicare Other | Attending: Internal Medicine | Admitting: Internal Medicine

## 2019-07-19 DIAGNOSIS — R2681 Unsteadiness on feet: Secondary | ICD-10-CM | POA: Diagnosis not present

## 2019-07-19 DIAGNOSIS — I152 Hypertension secondary to endocrine disorders: Secondary | ICD-10-CM

## 2019-07-19 DIAGNOSIS — I129 Hypertensive chronic kidney disease with stage 1 through stage 4 chronic kidney disease, or unspecified chronic kidney disease: Secondary | ICD-10-CM | POA: Diagnosis not present

## 2019-07-19 DIAGNOSIS — F17210 Nicotine dependence, cigarettes, uncomplicated: Secondary | ICD-10-CM | POA: Diagnosis not present

## 2019-07-19 DIAGNOSIS — I471 Supraventricular tachycardia: Secondary | ICD-10-CM | POA: Diagnosis not present

## 2019-07-19 DIAGNOSIS — I639 Cerebral infarction, unspecified: Secondary | ICD-10-CM | POA: Diagnosis not present

## 2019-07-19 DIAGNOSIS — Z8673 Personal history of transient ischemic attack (TIA), and cerebral infarction without residual deficits: Secondary | ICD-10-CM

## 2019-07-19 DIAGNOSIS — R778 Other specified abnormalities of plasma proteins: Secondary | ICD-10-CM

## 2019-07-19 DIAGNOSIS — E1151 Type 2 diabetes mellitus with diabetic peripheral angiopathy without gangrene: Secondary | ICD-10-CM | POA: Diagnosis not present

## 2019-07-19 DIAGNOSIS — I6350 Cerebral infarction due to unspecified occlusion or stenosis of unspecified cerebral artery: Secondary | ICD-10-CM | POA: Diagnosis present

## 2019-07-19 DIAGNOSIS — Z905 Acquired absence of kidney: Secondary | ICD-10-CM

## 2019-07-19 DIAGNOSIS — R7989 Other specified abnormal findings of blood chemistry: Secondary | ICD-10-CM

## 2019-07-19 DIAGNOSIS — E669 Obesity, unspecified: Secondary | ICD-10-CM | POA: Diagnosis not present

## 2019-07-19 DIAGNOSIS — Z7984 Long term (current) use of oral hypoglycemic drugs: Secondary | ICD-10-CM | POA: Diagnosis not present

## 2019-07-19 DIAGNOSIS — E1122 Type 2 diabetes mellitus with diabetic chronic kidney disease: Secondary | ICD-10-CM

## 2019-07-19 DIAGNOSIS — Z6830 Body mass index (BMI) 30.0-30.9, adult: Secondary | ICD-10-CM | POA: Diagnosis not present

## 2019-07-19 DIAGNOSIS — R29702 NIHSS score 2: Secondary | ICD-10-CM | POA: Diagnosis not present

## 2019-07-19 DIAGNOSIS — Z7982 Long term (current) use of aspirin: Secondary | ICD-10-CM | POA: Diagnosis not present

## 2019-07-19 DIAGNOSIS — I248 Other forms of acute ischemic heart disease: Secondary | ICD-10-CM | POA: Diagnosis not present

## 2019-07-19 DIAGNOSIS — I2510 Atherosclerotic heart disease of native coronary artery without angina pectoris: Secondary | ICD-10-CM

## 2019-07-19 DIAGNOSIS — I69398 Other sequelae of cerebral infarction: Secondary | ICD-10-CM | POA: Diagnosis not present

## 2019-07-19 DIAGNOSIS — E7800 Pure hypercholesterolemia, unspecified: Secondary | ICD-10-CM | POA: Diagnosis not present

## 2019-07-19 DIAGNOSIS — Z20822 Contact with and (suspected) exposure to covid-19: Secondary | ICD-10-CM | POA: Diagnosis not present

## 2019-07-19 DIAGNOSIS — N1831 Chronic kidney disease, stage 3a: Secondary | ICD-10-CM | POA: Diagnosis not present

## 2019-07-19 DIAGNOSIS — I10 Essential (primary) hypertension: Secondary | ICD-10-CM

## 2019-07-19 DIAGNOSIS — Z833 Family history of diabetes mellitus: Secondary | ICD-10-CM | POA: Diagnosis not present

## 2019-07-19 DIAGNOSIS — E1159 Type 2 diabetes mellitus with other circulatory complications: Secondary | ICD-10-CM

## 2019-07-19 DIAGNOSIS — Z8249 Family history of ischemic heart disease and other diseases of the circulatory system: Secondary | ICD-10-CM | POA: Diagnosis not present

## 2019-07-19 DIAGNOSIS — R42 Dizziness and giddiness: Secondary | ICD-10-CM | POA: Diagnosis present

## 2019-07-19 DIAGNOSIS — Z955 Presence of coronary angioplasty implant and graft: Secondary | ICD-10-CM | POA: Diagnosis not present

## 2019-07-19 DIAGNOSIS — E440 Moderate protein-calorie malnutrition: Secondary | ICD-10-CM | POA: Insufficient documentation

## 2019-07-19 DIAGNOSIS — N400 Enlarged prostate without lower urinary tract symptoms: Secondary | ICD-10-CM | POA: Diagnosis not present

## 2019-07-19 DIAGNOSIS — Z79899 Other long term (current) drug therapy: Secondary | ICD-10-CM | POA: Diagnosis not present

## 2019-07-19 HISTORY — DX: Personal history of transient ischemic attack (TIA), and cerebral infarction without residual deficits: Z86.73

## 2019-07-19 HISTORY — DX: Cerebral infarction, unspecified: I63.9

## 2019-07-19 HISTORY — DX: Essential (primary) hypertension: I10

## 2019-07-19 HISTORY — DX: Acquired absence of kidney: Z90.5

## 2019-07-19 HISTORY — DX: Atherosclerotic heart disease of native coronary artery without angina pectoris: I25.10

## 2019-07-19 HISTORY — DX: Type 2 diabetes mellitus with diabetic chronic kidney disease: E11.22

## 2019-07-19 LAB — GLUCOSE, CAPILLARY
Glucose-Capillary: 157 mg/dL — ABNORMAL HIGH (ref 70–99)
Glucose-Capillary: 152 mg/dL — ABNORMAL HIGH (ref 70–99)
Glucose-Capillary: 152 mg/dL — ABNORMAL HIGH (ref 70–99)
Glucose-Capillary: 120 mg/dL — ABNORMAL HIGH (ref 70–99)
Glucose-Capillary: 121 mg/dL — ABNORMAL HIGH (ref 70–99)

## 2019-07-19 LAB — URINALYSIS, COMPLETE (UACMP) WITH MICROSCOPIC
Bacteria, UA: NONE SEEN
Bilirubin Urine: NEGATIVE
Glucose, UA: 150 mg/dL — AB
Ketones, ur: 20 mg/dL — AB
Leukocytes,Ua: NEGATIVE
Nitrite: NEGATIVE
Protein, ur: >=300 mg/dL — AB
Specific Gravity, Urine: 1.024 (ref 1.005–1.030)
Squamous Epithelial / HPF: NONE SEEN (ref 0–5)
pH: 5 (ref 5.0–8.0)

## 2019-07-19 LAB — TROPONIN I (HIGH SENSITIVITY)
Troponin I (High Sensitivity): 25 ng/L — ABNORMAL HIGH (ref <18)
Troponin I (High Sensitivity): 38 ng/L — ABNORMAL HIGH (ref <18)

## 2019-07-19 LAB — HEMOGLOBIN A1C
Hgb A1c MFr Bld: 5.1 % (ref 4.8–5.6)
Mean Plasma Glucose: 99.67 mg/dL

## 2019-07-19 LAB — CREATININE, SERUM
Creatinine, Ser: 1.32 mg/dL — ABNORMAL HIGH (ref 0.61–1.24)
GFR calc Af Amer: >60 mL/min (ref >60)
GFR calc non Af Amer: 55 mL/min — ABNORMAL LOW (ref >60)

## 2019-07-19 LAB — SARS CORONAVIRUS 2 (TAT 6-24 HRS): SARS Coronavirus 2: NEGATIVE

## 2019-07-19 MED ORDER — INSULIN ASPART 100 UNIT/ML ~~LOC~~ SOLN: 0.0000 [IU] | Freq: Every day | SUBCUTANEOUS | Status: DC

## 2019-07-19 MED ORDER — ASPIRIN EC 81 MG PO TBEC
81.0000 mg | DELAYED_RELEASE_TABLET | Freq: Every day | ORAL | Status: DC
  Administered 2019-07-20: 81 mg via ORAL
  Filled 2019-07-19: qty 1

## 2019-07-19 MED ORDER — SENNOSIDES-DOCUSATE SODIUM 8.6-50 MG PO TABS: 1.0000 | ORAL_TABLET | Freq: Every evening | ORAL | Status: DC | PRN

## 2019-07-19 MED ORDER — ACETAMINOPHEN 160 MG/5ML PO SOLN
650.0000 mg | ORAL | Status: DC | PRN
  Filled 2019-07-19: qty 20.3

## 2019-07-19 MED ORDER — DEXTROSE-NACL 5-0.45 % IV SOLN: INTRAVENOUS | Status: DC

## 2019-07-19 MED ORDER — INSULIN ASPART 100 UNIT/ML ~~LOC~~ SOLN
0.0000 [IU] | Freq: Three times a day (TID) | SUBCUTANEOUS | Status: DC
  Administered 2019-07-19 – 2019-07-20 (×2): 3 [IU] via SUBCUTANEOUS
  Administered 2019-07-20: 2 [IU] via SUBCUTANEOUS
  Administered 2019-07-20: 3 [IU] via SUBCUTANEOUS
  Filled 2019-07-19 (×4): qty 1

## 2019-07-19 MED ORDER — ACETAMINOPHEN 325 MG PO TABS: 650.0000 mg | ORAL_TABLET | ORAL | Status: DC | PRN

## 2019-07-19 MED ORDER — ATORVASTATIN CALCIUM 20 MG PO TABS
40.0000 mg | ORAL_TABLET | Freq: Every day | ORAL | Status: DC
  Administered 2019-07-20: 40 mg via ORAL
  Filled 2019-07-19: qty 2

## 2019-07-19 MED ORDER — ACETAMINOPHEN 650 MG RE SUPP: 650.0000 mg | RECTAL | Status: DC | PRN

## 2019-07-19 MED ORDER — ENOXAPARIN SODIUM 40 MG/0.4ML ~~LOC~~ SOLN
40.0000 mg | SUBCUTANEOUS | Status: DC
  Administered 2019-07-19 – 2019-07-20 (×2): 40 mg via SUBCUTANEOUS
  Filled 2019-07-19 (×2): qty 0.4

## 2019-07-19 MED ORDER — TAMSULOSIN HCL 0.4 MG PO CAPS
0.4000 mg | ORAL_CAPSULE | Freq: Two times a day (BID) | ORAL | Status: DC
  Administered 2019-07-20: 0.4 mg via ORAL
  Filled 2019-07-19: qty 1

## 2019-07-19 MED ORDER — SODIUM CHLORIDE 0.9 % IV BOLUS
500.0000 mL | Freq: Once | INTRAVENOUS | Status: AC
  Administered 2019-07-19: 500 mL via INTRAVENOUS

## 2019-07-19 MED ORDER — SODIUM CHLORIDE 0.9 % IV SOLN: INTRAVENOUS | Status: DC

## 2019-07-19 MED ORDER — ASPIRIN 300 MG RE SUPP
300.0000 mg | Freq: Every day | RECTAL | Status: DC
  Administered 2019-07-19: 300 mg via RECTAL
  Filled 2019-07-19 (×4): qty 1

## 2019-07-19 MED ORDER — PNEUMOCOCCAL VAC POLYVALENT 25 MCG/0.5ML IJ INJ: 0.5000 mL | INJECTION | INTRAMUSCULAR | Status: DC

## 2019-07-19 MED ORDER — DILTIAZEM HCL 30 MG PO TABS
60.0000 mg | ORAL_TABLET | Freq: Two times a day (BID) | ORAL | Status: DC
  Administered 2019-07-20: 60 mg via ORAL
  Filled 2019-07-19: qty 2

## 2019-07-19 MED ORDER — MECLIZINE HCL 25 MG PO TABS
25.0000 mg | ORAL_TABLET | Freq: Once | ORAL | Status: AC
  Administered 2019-07-19: 25 mg via ORAL
  Filled 2019-07-19: qty 1

## 2019-07-19 MED ORDER — NICOTINE 21 MG/24HR TD PT24
21.0000 mg | MEDICATED_PATCH | Freq: Every day | TRANSDERMAL | Status: DC
  Administered 2019-07-19 – 2019-07-20 (×2): 21 mg via TRANSDERMAL
  Filled 2019-07-19 (×2): qty 1

## 2019-07-19 MED ORDER — QUETIAPINE FUMARATE 25 MG PO TABS
25.0000 mg | ORAL_TABLET | Freq: Every day | ORAL | Status: DC
  Administered 2019-07-20: 25 mg via ORAL
  Filled 2019-07-19: qty 1

## 2019-07-19 MED ORDER — CLOPIDOGREL BISULFATE 75 MG PO TABS
75.0000 mg | ORAL_TABLET | Freq: Every day | ORAL | Status: DC
  Administered 2019-07-20: 75 mg via ORAL
  Filled 2019-07-19: qty 1

## 2019-07-19 MED ORDER — CLOPIDOGREL BISULFATE 75 MG PO TABS: 75.0000 mg | ORAL_TABLET | Freq: Every day | ORAL | Status: DC

## 2019-07-19 MED ORDER — STROKE: EARLY STAGES OF RECOVERY BOOK: Freq: Once | Status: AC

## 2019-07-19 MED ORDER — QUETIAPINE FUMARATE 25 MG PO TABS
50.0000 mg | ORAL_TABLET | Freq: Every day | ORAL | Status: DC
  Filled 2019-07-19: qty 2

## 2019-07-19 MED ORDER — LISINOPRIL 20 MG PO TABS
20.0000 mg | ORAL_TABLET | Freq: Every day | ORAL | Status: DC
  Administered 2019-07-20: 20 mg via ORAL
  Filled 2019-07-19: qty 1

## 2019-07-19 NOTE — Plan of Care (Signed)
Pt admitted today from the ED.  Confused. Alert to self and place.  NIH 2. No neuro changes during the shift.  Often needs redirection.  Kept NPO until SLP sees him.

## 2019-07-19 NOTE — ED Notes (Signed)
Attempted to call receiving floor. Was unable to talk to receiving nurse. Will call back in a few minutes.

## 2019-07-19 NOTE — Evaluation (Signed)
Occupational Therapy Evaluation Patient Details Name: Steven Ingram MRN: [id number] DOB: 05/29/1950 Today's Date: 07/19/2019    History of Present Illness Steven Ingram is a 89ZJI who comes to [facility] 4/3 c dizziness and unsteady gait. In ED exam revealing of LUE ataxia and horizontal nystagmus. Pt confused and disoriented in ED, later reported as baseline per wife. In ED pt has no functional improvement with meclizine. PMH: dizziness, gait unsteadness, CVA (june 2020), DM2, CAD, HTN. MRI report includes the following: "Small foci of acute ischemia within the left paramedian pons and right frontoparietal junction."   Clinical Impression   [name] was seen for OT evaluation this date. Unable to obtain pt home setup this date; however, per [facility] pt has history of cognitive impairment following CVA in June 2020. Pt presents to acute OT demonstrating impaired ADL performance, functional cognition, and functional mobility 2/2 decreased safety awareness, impulsivity, and functional balance/endurance deficits. Pt currently requires CGA to don/doff socks seated EOB - assist for posterior/R lateral LOB. MIN A + VCs for SPT bed<>chair. Pt would benefit from skilled OT to address noted impairments and functional limitations (see below for any additional details) in order to maximize safety and independence while minimizing falls risk and caregiver burden. Upon hospital discharge, recommend [place] c 24 hour supervision to maximize pt safety and return to functional independence during meaningful occupations of daily life.     Follow Up Recommendations  Home health OT;Supervision/Assistance - 24 hour    Equipment Recommendations  Other (comment)(Recommendations TBD after home setup obtained)    Recommendations for Other Services       Precautions / Restrictions Precautions Precautions: Fall      Mobility Bed Mobility Overal bed mobility: Independent                Transfers Overall transfer  level: Needs assistance   Transfers: Sit to/from Stand Sit to Stand: Min guard         General transfer comment: Pt required CGA + HH support sit<>stand at bed. OT requested pt stand to scoot higher at EOB and pt quickly moved to sit at chair. Required MIN A + arm rest to maintain balance and sit in chair. Pt has strength for SPT back to bed but requires MIN A for balance.    Balance Overall balance assessment: Needs assistance Sitting-balance support: No upper extremity supported;Feet supported Sitting balance-Leahy Scale: Good   Postural control: Posterior lean(during functional activity) Standing balance support: Bilateral upper extremity supported Standing balance-Leahy Scale: Poor Standing balance comment: poor awareness of deficits                           ADL either performed or assessed with clinical judgement   ADL Overall ADL's : Needs assistance/impaired                                       General ADL Comments: CGA don/doff L sock seated EOB - CGA required 2/2 balance deficits (pt demonstrated R lateral/posterior lean). Pt reported dizzyness c LB access. Impulsive movements and minor LOBs in standing - anticipate MIN A for standing ADLs     Vision         Perception     Praxis      Pertinent Vitals/Pain Pain Assessment: No/denies pain     Hand Dominance Left   Extremity/Trunk Assessment Upper Extremity  Assessment Upper Extremity Assessment: Overall WFL for tasks assessed   Lower Extremity Assessment Lower Extremity Assessment: Overall WFL for tasks assessed       Communication Communication Communication: No difficulties   Cognition Arousal/Alertness: Awake/alert Behavior During Therapy: WFL for tasks assessed/performed;Impulsive Overall Cognitive Status: History of cognitive impairments - at baseline                                 General Comments: well-documented cognitive impairment. Pt oriented to  self, repeatedly states location as "[facility]" even with redirection   General Comments       Exercises Exercises: Other exercises Other Exercises Other Exercises: bed mobility, SPT, don/doff R sock, sitting/standing balance/tolerance   Shoulder Instructions      Home Living Family/patient expects to be discharged to:: Private residence Living Arrangements: Spouse/significant other(girlfriend)                               Additional Comments: need more updates once caregiver is available.      Prior Functioning/Environment          Comments: Pt unable to provide PLOF        OT Problem List: Decreased activity tolerance;Impaired balance (sitting and/or standing);Decreased safety awareness;Decreased cognition      OT Treatment/Interventions: Self-care/ADL training;Therapeutic exercise;Neuromuscular education;Energy conservation;DME and/or AE instruction;Therapeutic activities;Cognitive remediation/compensation;Balance training;Patient/family education;Visual/perceptual remediation/compensation    OT Goals(Current goals can be found in the care plan section) Acute Rehab OT Goals Patient Stated Goal: Unable to state OT Goal Formulation: Patient unable to participate in goal setting Time For Goal Achievement: 08/02/19 Potential to Achieve Goals: Fair ADL Goals Pt Will Perform Grooming: with supervision;standing(c LRAD PRN and no LOBs) Pt Will Perform Lower Body Dressing: with supervision;sit to/from stand(c LRAD PRN) Pt Will Transfer to Toilet: with min guard assist;ambulating;regular height toilet(c LRAD PRN)  OT Frequency: Min 2X/week   Barriers to D/C: Inaccessible home environment;Decreased caregiver support          Co-evaluation              AM-PAC OT "6 Clicks" Daily Activity     Outcome Measure Help from another person eating meals?: A Little Help from another person taking care of personal grooming?: A Little Help from another  person toileting, which includes using toliet, bedpan, or urinal?: A Little Help from another person bathing (including washing, rinsing, drying)?: A Little Help from another person to put on and taking off regular upper body clothing?: A Little Help from another person to put on and taking off regular lower body clothing?: A Little 6 Click Score: 18   End of Session    Activity Tolerance: Other (comment)(Tx limited by limited safety awareness/impulsivity) Patient left: in bed;with call bell/phone within reach;with bed alarm set;with nursing/sitter in room(NT in room to take vitals)  OT Visit Diagnosis: Unsteadiness on feet (R26.81);Other abnormalities of gait and mobility (R26.89)                Time: 8185-6314 OT Time Calculation (min): 11 min Charges:  OT General Charges $OT Visit: 1 Visit OT Evaluation $OT Eval Moderate Complexity: 1 Mod OT Treatments $Self Care/Home Management : 8-22 mins  Dessie Coma, M.S. OTR/L  07/19/19, 3:44 PM

## 2019-07-19 NOTE — Progress Notes (Addendum)
Progress Note    Steven Ingram  QQP:[id number] DOB: [birth year]  DOA: 07/19/2019 PCP: [facility] [place]      Brief Narrative:    Medical records reviewed and are as summarized below:  Steven Ingram is an 69 y.o. male  with medical history significant for DM 2, GERD, BPH, HTN, CAD status post stent, history of CVA with residual confusion, history right nephrectomy and CKD stage 3a who presented to the emergency room with a several  lightheadedness, dizziness and unsteady gait while walking.  MRI revealed small foci of acute ischemia in the left paramedian pons and right frontoparietal junction.      Assessment/Plan:   Principal Problem:   Left pontine stroke (HCC) Active Problems:   Dizziness   Unsteady gait when walking   History of CVA (cerebrovascular accident)   Type 2 diabetes mellitus with stage 3 chronic kidney disease (HCC)   CAD (coronary artery disease)   HTN (hypertension)   H/O right nephrectomy   Acute stroke with dizziness and unsteady gait Patient has been evaluated by neurologist who recommended dual antiplatelet therapy with aspirin and Plavix for 3 weeks followed by Plavix monotherapy.  No hemodynamically significant stenosis on carotid duplex.  2D echo is pending.  -Permissive hypertension -PT and OT evaluation -Swallow evaluation by speech therapist     Type 2 diabetes mellitus with stage 3 chronic kidney disease ([place]) -Sliding scale insulin as needed.  Glipizide, Invokana and Metformin on hold.  Hemoglobin A1c is 5.1.  Mildly elevated troponin with underlying CAD Likely due to demand ischemia probably from hypertension versus acute stroke Continue antiplatelets    HTN (hypertension) -Continue antihypertensives when able    H/O right nephrectomy CKD stage 3a -Renal function at baseline  Body mass index is 30.72 kg/m.  (Obesity)   Family Communication/Anticipated D/C date and plan/Code Status   DVT prophylaxis:  Lovenox Code Status: Full code Family Communication: Plan discussed with the patient Disposition Plan: Patient is from home.  Disposition to be determined pending PT and OT evaluation.      Subjective:   C/o dizziness.  Objective:    Vitals:   07/19/19 0035 07/19/19 0411 07/19/19 0731 07/19/19 0914  BP: (!) 176/81 (!) 177/90 (!) 151/78 (!) 174/82  Pulse: 79 78 77 68  Resp: 16 16 16 17   Temp: 98.5 F (36.9 C)   98.7 F (37.1 C)  TempSrc: Oral   Oral  SpO2: 100% 100% 99% 98%  Height:        Intake/Output Summary (Last 24 hours) at 07/19/2019 1138 Last data filed at 07/19/2019 7124 Gross per 24 hour  Intake 500 ml  Output --  Net 500 ml   There were no vitals filed for this visit.  Exam:  GEN: NAD SKIN: No rash EYES: EOMI ENT: MMM CV: RRR PULM: CTA B ABD: soft, ND, NT, +BS CNS: AAO x 3, non focal EXT: No edema or tenderness   Data Reviewed:   I have personally reviewed following labs and imaging studies:  Labs: Labs show the following:   Basic Metabolic Panel: Recent Labs  Lab 07/18/19 2034 07/19/19 0559  NA 141  --   K 4.6  --   CL 105  --   CO2 25  --   GLUCOSE 212*  --   BUN 13  --   CREATININE 1.50* 1.32*  CALCIUM 9.7  --    GFR CrCl cannot be calculated (Unknown ideal weight.). Liver Function Tests: No  results for input(s): AST, ALT, ALKPHOS, BILITOT, PROT, ALBUMIN in the last 168 hours. No results for input(s): LIPASE, AMYLASE in the last 168 hours. No results for input(s): AMMONIA in the last 168 hours. Coagulation profile No results for input(s): INR, PROTIME in the last 168 hours.  CBC: Recent Labs  Lab 07/18/19 2034  WBC 3.3*  HGB 14.1  HCT 41.6  MCV 97.0  PLT 183   Cardiac Enzymes: No results for input(s): CKTOTAL, CKMB, CKMBINDEX, TROPONINI in the last 168 hours. BNP (last 3 results) No results for input(s): PROBNP in the last 8760 hours. CBG: Recent Labs  Lab 07/18/19 2024 07/19/19 0827 07/19/19 0924  GLUCAP  176* 157* 152*   D-Dimer: No results for input(s): DDIMER in the last 72 hours. Hgb A1c: Recent Labs    07/19/19 0559  HGBA1C 5.1   Lipid Profile: No results for input(s): CHOL, HDL, LDLCALC, TRIG, CHOLHDL, LDLDIRECT in the last 72 hours. Thyroid function studies: No results for input(s): TSH, T4TOTAL, T3FREE, THYROIDAB in the last 72 hours.  Invalid input(s): FREET3 Anemia work up: No results for input(s): VITAMINB12, FOLATE, FERRITIN, TIBC, IRON, RETICCTPCT in the last 72 hours. Sepsis Labs: Recent Labs  Lab 07/18/19 2034  WBC 3.3*    Microbiology No results found for this or any previous visit (from the past 240 hour(s)).  Procedures and diagnostic studies:  CT Head Wo Contrast  Result Date: 07/19/2019 CLINICAL DATA:  Lightheadedness. EXAM: CT HEAD WITHOUT CONTRAST TECHNIQUE: Contiguous axial images were obtained from the base of the skull through the vertex without intravenous contrast. COMPARISON:  CT head dated October 02, 2018. FINDINGS: Brain: No evidence of acute infarction, hemorrhage, hydrocephalus, extra-axial collection or mass lesion/mass effect. Atrophy and chronic microvascular ischemic changes are noted. Vascular: No hyperdense vessel or unexpected calcification. Skull: There is soft tissue swelling along the posterior left scalp evidence for an underlying fracture. Sinuses/Orbits: No acute finding. Other: None. IMPRESSION: 1. No acute intracranial pathology. 2. Soft tissue swelling along the posterior left scalp without evidence of an underlying fracture. Electronically Signed   By: Constance Holster M.D.   On: 07/19/2019 01:13   MR BRAIN WO CONTRAST  Result Date: 07/19/2019 CLINICAL DATA:  Ataxia and possible stroke EXAM: MRI HEAD WITHOUT CONTRAST TECHNIQUE: Multiplanar, multiecho pulse sequences of the brain and surrounding structures were obtained without intravenous contrast. COMPARISON:  None. FINDINGS: Brain: There is a small focus of abnormal diffusion  restriction within the left paramedian pons. There is also a small focus of diffusion restriction at the right frontoparietal junction. There is multifocal periventricular white matter hyperintensity, most often a result of chronic microvascular ischemia. Advanced atrophy for age. Normal midline structures. Vascular: Normal flow voids. Skull and upper cervical spine: Normal marrow signal. Sinuses/Orbits: Negative. Other: None. IMPRESSION: 1. Small foci of acute ischemia within the left paramedian pons and right frontoparietal junction. No hemorrhage or mass effect. 2. Advanced atrophy and findings of chronic microvascular ischemia. Electronically Signed   By: Ulyses Jarred M.D.   On: 07/19/2019 06:01   US Carotid Bilateral (at [facility] and [facility] only)  Result Date: 07/19/2019 CLINICAL DATA:  Hypertension, stroke, hyperlipidemia EXAM: BILATERAL CAROTID DUPLEX ULTRASOUND TECHNIQUE: Pearline Cables scale imaging, color Doppler and duplex ultrasound were performed of bilateral carotid and vertebral arteries in the neck. COMPARISON:  10/03/2018 FINDINGS: Criteria: Quantification of carotid stenosis is based on velocity parameters that correlate the residual internal carotid diameter with NASCET-based stenosis levels, using the diameter of the distal internal carotid lumen as  the denominator for stenosis measurement. The following velocity measurements were obtained: RIGHT ICA: 67/16 cm/sec CCA: 10/62 cm/sec SYSTOLIC ICA/CCA RATIO:  0.9 ECA: 73 cm/sec LEFT ICA: 79/15 cm/sec CCA: 69/48 cm/sec SYSTOLIC ICA/CCA RATIO:  1.1 ECA: 75 cm/sec RIGHT CAROTID ARTERY: Minor echogenic shadowing plaque formation. No hemodynamically significant right ICA stenosis, velocity elevation, or turbulent flow. Degree of narrowing less than 50%. RIGHT VERTEBRAL ARTERY:  Antegrade LEFT CAROTID ARTERY: Similar scattered minor echogenic plaque formation. No hemodynamically significant left ICA stenosis, velocity elevation, or turbulent flow. LEFT VERTEBRAL  ARTERY:  Antegrade IMPRESSION: Stable carotid atherosclerosis. No hemodynamically significant ICA stenosis. Degree of narrowing less than 50% bilaterally by ultrasound criteria. Patent antegrade vertebral flow bilaterally Electronically Signed   By: Jerilynn Mages.  Shick M.D.   On: 07/19/2019 08:30    Medications:   .  stroke: mapping our early stages of recovery book   Does not apply Once  . aspirin EC  81 mg Oral Daily  . aspirin  300 mg Rectal Daily  . atorvastatin  40 mg Oral Daily  . clopidogrel  75 mg Oral Daily  . [START ON 07/20/2019] diltiazem  60 mg Oral Q12H  . enoxaparin (LOVENOX) injection  40 mg Subcutaneous Q24H  . insulin aspart  0-15 Units Subcutaneous TID WC  . insulin aspart  0-5 Units Subcutaneous QHS  . [START ON 07/20/2019] lisinopril  20 mg Oral Daily  . [START ON 07/20/2019] QUEtiapine  25 mg Oral Daily  . QUEtiapine  50 mg Oral QHS  . sodium chloride flush  3 mL Intravenous Once  . [START ON 07/20/2019] tamsulosin  0.4 mg Oral BID   Continuous Infusions: . dextrose 5 % and 0.45% NaCl       LOS: 0 days   Theodoros Stjames  [facility]     07/19/2019, 11:38 AM

## 2019-07-19 NOTE — ED Provider Notes (Signed)
[facility] Emergency Department Provider Note ____________________________________________   First MD Initiated Contact with Patient 07/19/19 0020     (approximate)  I have reviewed the triage vital signs and the nursing notes.   HISTORY  Chief Complaint Dizziness    HPI Steven Ingram is a 69 y.o. male with PMH as noted below who presents with dizziness.  He states it feels like he is lightheaded, but also like he is being moved.  It is worse when he stands up and walks around.  Has been associated with nausea as well.  He denies any headache, chest pain, difficulty breathing, vomiting, fever or chills, or other acute symptoms.  He states that he has had similar dizziness before, but usually it goes away after a short time but this has lasted much longer.  Past Medical History:  Diagnosis Date  . Diabetes mellitus without complication ([place])   . High cholesterol   . Hypertension     Patient Active Problem List   Diagnosis Date Noted  . Dizziness 07/19/2019  . Unsteady gait when walking 07/19/2019  . History of CVA (cerebrovascular accident) 07/19/2019  . Type 2 diabetes mellitus with stage 3 chronic kidney disease ([place]) 07/19/2019  . CAD (coronary artery disease) 07/19/2019  . HTN (hypertension) 07/19/2019  . H/O right nephrectomy 07/19/2019  . CVA (cerebral vascular accident) ([place]) 10/03/2018    Past Surgical History:  Procedure Laterality Date  . TEE WITHOUT CARDIOVERSION N/A 10/07/2018   Procedure: TRANSESOPHAGEAL ECHOCARDIOGRAM (TEE);  Surgeon: Teodoro Spray, MD;  Location: [facility] ORS;  Service: Cardiovascular;  Laterality: N/A;    Prior to Admission medications   Medication Sig Start Date End Date Taking? Authorizing Provider  amLODipine (NORVASC) 5 MG tablet Take 5 mg by mouth daily. 04/03/19  Yes [provider]  aspirin EC 81 MG tablet Take 1 tablet (81 mg total) by mouth daily. 10/07/18 10/07/19 Yes Vaughan Basta, MD    atorvastatin (LIPITOR) 40 MG tablet Take 40 mg by mouth daily.   Yes [provider]  glipiZIDE (GLUCOTROL XL) 10 MG 24 hr tablet Take 10 mg by mouth daily with breakfast.   Yes [provider]  INVOKANA 100 MG TABS tablet Take 100 mg by mouth daily. 06/17/19  Yes [provider]  lisinopril (ZESTRIL) 20 MG tablet Take 20 mg by mouth daily. 04/08/19  Yes [provider]  metFORMIN (GLUCOPHAGE) 500 MG tablet Take 500 mg by mouth 2 (two) times daily with a meal.    Yes [provider]  QUEtiapine (SEROQUEL) 25 MG tablet Take 25 mg by mouth at bedtime. 07/12/19  Yes [provider]  QUEtiapine (SEROQUEL) 50 MG tablet Take 50 mg by mouth at bedtime. 05/13/19  Yes [provider]  tamsulosin (FLOMAX) 0.4 MG CAPS capsule Take 0.4 mg by mouth 2 (two) times a day. 08/05/18  Yes [provider]  diltiazem (CARDIZEM) 60 MG tablet Take 1 tablet (60 mg total) by mouth every 8 (eight) hours. 10/07/18   Vaughan Basta, MD  senna (SENOKOT) 8.6 MG tablet Take 1 tablet by mouth daily.    [provider]  senna-docusate (SENOKOT-S) 8.6-50 MG tablet Take 1 tablet by mouth at bedtime as needed for mild constipation. Patient not taking: Reported on 07/19/2019 10/07/18   Vaughan Basta, MD    Allergies Patient has no known allergies.  History reviewed. No pertinent family history.  Social History Social History   Tobacco Use  . Smoking status: Current Every  Day Smoker    Packs/day: 1.00    Years: 53.00    Pack years: 53.00    Types: Cigarettes  . Smokeless tobacco: Never Used  Substance Use Topics  . Alcohol use: Yes    Comment: last drink yesterday  . Drug use: Never    Review of Systems  Constitutional: No fever. Eyes: No redness. ENT: No sore throat. Cardiovascular: Denies chest pain. Respiratory: Denies shortness of breath. Gastrointestinal: No vomiting or diarrhea.  Genitourinary: Negative for dysuria.   Musculoskeletal: Negative for back pain. Skin: Negative for rash. Neurological: Negative for headache.   ____________________________________________   PHYSICAL EXAM:  VITAL SIGNS: ED Triage Vitals  Enc Vitals Group     BP 07/18/19 2007 (!) 157/78     Pulse Rate 07/18/19 2007 76     Resp 07/18/19 2007 18     Temp 07/18/19 2007 97.9 F (36.6 C)     Temp Source 07/18/19 2007 Oral     SpO2 07/18/19 2007 99 %     Weight --      Height 07/18/19 2014 5\' 9"  (1.753 m)     Head Circumference --      Peak Flow --      Pain Score 07/18/19 2013 0     Pain Loc --      Pain Edu? --      Excl. in [place]? --     Constitutional: Alert and oriented x3. Well appearing and in no acute distress. Eyes: Conjunctivae are normal.  EOMI.  PERRLA. Head: Atraumatic. Nose: No congestion/rhinnorhea. Mouth/Throat: Mucous membranes are moist.   Neck: Normal range of motion.  Cardiovascular: Normal rate, regular rhythm. Grossly normal heart sounds.  Good peripheral circulation. Respiratory: Normal respiratory effort.  No retractions. Lungs CTAB. Gastrointestinal: Soft and nontender. No distention.  Genitourinary: No flank tenderness. Musculoskeletal: No lower extremity edema.  Extremities warm and well perfused.  Neurologic:  Normal speech and language.  5/5 motor strength and intact sensation to all extremities.  Mild ataxia to the left upper extremity.  Horizontal nystagmus with lateral gaze. Skin:  Skin is warm and dry. No rash noted. Psychiatric: Mood and affect are normal. Speech and behavior are normal.  ____________________________________________   LABS (all labs ordered are listed, but only abnormal results are displayed)  Labs Reviewed  BASIC METABOLIC PANEL - Abnormal; Notable for the following components:      Result Value   Glucose, Bld 212 (*)    Creatinine, Ser 1.50 (*)    GFR calc non Af Amer 47 (*)    GFR calc Af Amer 54 (*)    All other components within normal limits  CBC -  Abnormal; Notable for the following components:   WBC 3.3 (*)    All other components within normal limits  URINALYSIS, COMPLETE (UACMP) WITH MICROSCOPIC - Abnormal; Notable for the following components:   Color, Urine YELLOW (*)    APPearance CLEAR (*)    Glucose, UA 150 (*)    Hgb urine dipstick MODERATE (*)    Ketones, ur 20 (*)    Protein, ur >=300 (*)    All other components within normal limits  GLUCOSE, CAPILLARY - Abnormal; Notable for the following components:   Glucose-Capillary 176 (*)    All other components within normal limits  TROPONIN I (HIGH SENSITIVITY) - Abnormal; Notable for the following components:   Troponin I (High Sensitivity) 25 (*)    All other components within normal limits  TROPONIN I (HIGH  SENSITIVITY) - Abnormal; Notable for the following components:   Troponin I (High Sensitivity) 38 (*)    All other components within normal limits  SARS CORONAVIRUS 2 (TAT 6-24 HRS)  HEMOGLOBIN A1C  CREATININE, SERUM  CBG MONITORING, ED   ____________________________________________  EKG  ED ECG REPORT I, Arta Silence, the attending physician, personally viewed and interpreted this ECG.  Date: 07/19/2019 EKG Time: 1233 Rate: 82 Rhythm: normal sinus rhythm QRS Axis: normal Intervals: normal ST/T Wave abnormalities: normal Narrative Interpretation: no evidence of acute ischemia  ____________________________________________  RADIOLOGY  CT head: No acute findings  ____________________________________________   PROCEDURES  Procedure(s) performed: No  Procedures  Critical Care performed: No ____________________________________________   INITIAL IMPRESSION / ASSESSMENT AND PLAN / ED COURSE  Pertinent labs & imaging results that were available during my care of the patient were reviewed by me and considered in my medical decision making (see chart for details).  69 year old male with PMH as noted above presents with dizziness described  both as lightheadedness and as a sensation of movement and associated with nausea.  It has been present today.  The patient reports prior similar episodes that resolved, but this has lasted much longer.  I reviewed the past medical records in [place].  The patient was admitted in June of last year for a small acute stroke.  His main presentation at that time was altered mental status.  On exam today, the patient is overall relatively well-appearing for his age.  His vital signs are normal except for hypertension.  He has mild ataxia to the left upper extremity and some horizontal nystagmus but an otherwise normal neurologic exam.  The patient is mostly alert and oriented, however when asked where he was he stated [facility].  When asked further about how he had gotten to the hospital, he appears slightly confused about it.  He also at one point while waiting came up to the desk stating he was here to pick somebody up, and seemed to not realize that he was a patient.  Overall presentation is most consistent with peripheral vertigo, dehydration, or other relatively benign etiology.  It is possible that the patient is mildly confused at baseline especially after the prior stroke.  I attempted to call the patient's wife and daughter to get corroboration but was unable to reach him.  Will obtain a CT head, give meclizine, fluid bolus, and reassess.  ----------------------------------------- 5:32 AM on 07/19/2019 -----------------------------------------  The patient's wife is now here, and reports that he has had similar intermittent confusion ever since his stroke last year.  She confirms he is at his baseline mental status currently.  The lab work-up was significant for a borderline elevated troponin.  Repeat after 2 hours showed an increase, although the patient does not have chest pain or other acute symptoms of ACS.  In addition, he continues to be significantly dizzy and unsteady on his feet  when trying to ambulate, even after the meclizine.  The wife reports that this is not consistent with his baseline.  Given the persistent dizziness, inability to ambulate, and the rising troponin I proceeded to admit the patient.  I discussed his case with hospitalist Dr. Damita Dunnings.  I also ordered an MRI to evaluate for possible stroke although I have a low suspicion at this time.  ____________________________________________   FINAL CLINICAL IMPRESSION(S) / ED DIAGNOSES  Final diagnoses:  Dizziness  Elevated troponin      NEW MEDICATIONS STARTED DURING THIS VISIT:  New Prescriptions  No medications on file     Note:  This document was prepared using Dragon voice recognition software and may include unintentional dictation errors.    Arta Silence, MD 07/19/19 [id number]

## 2019-07-19 NOTE — H&P (Signed)
History and Physical    Steven Ingram NKN:[id number] DOB: 09/19/1950 DOA: 07/19/2019  PCP: [facility] [employer]   Patient coming from: home I have personally briefly reviewed patient's old medical records in [place]  Chief Complaint: Dizziness and unsteady gait  HPI: Steven Ingram is a 69 y.o. male with medical history significant for DM 2, GERD, BPH, HTN, CAD status post stent, history of CVA with residual confusion, history right nephrectomy and CKD 3 who presents to the emergency room with a several hour onset, over 10 h of lightheadedness, dizziness and unsteady gait while walking. Denies one-sided deficits such as numbness tingling or weakness in the face or extremities. He denies headache, visual disturbance, nausea vomiting. No recent illness.  ED Course: On arrival in the ER blood pressure 176/81 with otherwise normal vitals. EKG showed no acute ST-T wave changes. Troponin was 25>>38. CT head showed no acute findings. Chest x-ray pending. Blood work was mostly unremarkable. Creatinine at baseline at 1.5, blood sugar slightly elevated at 212. MRI ordered from the ER, still pending. Hospitalist consulted for admission  Review of Systems: As per HPI otherwise 10 point review of systems negative.    Past Medical History:  Diagnosis Date   Diabetes mellitus without complication ([place])    High cholesterol    Hypertension     Past Surgical History:  Procedure Laterality Date   TEE WITHOUT CARDIOVERSION N/A 10/07/2018   Procedure: TRANSESOPHAGEAL ECHOCARDIOGRAM (TEE);  Surgeon: Teodoro Spray, MD;  Location: [facility] ORS;  Service: Cardiovascular;  Laterality: N/A;     reports that he has been smoking cigarettes. He has a 53.00 pack-year smoking history. He has never used smokeless tobacco. He reports current alcohol use. He reports that he does not use drugs.  No Known Allergies  History reviewed. No pertinent family history.   Prior to Admission medications    Medication Sig Start Date End Date Taking? Authorizing Provider  amLODipine (NORVASC) 5 MG tablet Take 5 mg by mouth daily. 04/03/19  Yes [provider]  aspirin EC 81 MG tablet Take 1 tablet (81 mg total) by mouth daily. 10/07/18 10/07/19 Yes Vaughan Basta, MD  atorvastatin (LIPITOR) 40 MG tablet Take 40 mg by mouth daily.   Yes [provider]  glipiZIDE (GLUCOTROL XL) 10 MG 24 hr tablet Take 10 mg by mouth daily with breakfast.   Yes [provider]  INVOKANA 100 MG TABS tablet Take 100 mg by mouth daily. 06/17/19  Yes [provider]  lisinopril (ZESTRIL) 20 MG tablet Take 20 mg by mouth daily. 04/08/19  Yes [provider]  metFORMIN (GLUCOPHAGE) 500 MG tablet Take 500 mg by mouth 2 (two) times daily with a meal.    Yes [provider]  QUEtiapine (SEROQUEL) 25 MG tablet Take 25 mg by mouth at bedtime. 07/12/19  Yes [provider]  QUEtiapine (SEROQUEL) 50 MG tablet Take 50 mg by mouth at bedtime. 05/13/19  Yes [provider]  tamsulosin (FLOMAX) 0.4 MG CAPS capsule Take 0.4 mg by mouth 2 (two) times a day. 08/05/18  Yes [provider]  diltiazem (CARDIZEM) 60 MG tablet Take 1 tablet (60 mg total) by mouth every 8 (eight) hours. 10/07/18   Vaughan Basta, MD  senna (SENOKOT) 8.6 MG tablet Take 1 tablet by mouth daily.    [provider]  senna-docusate (SENOKOT-S) 8.6-50 MG tablet Take 1 tablet by mouth at bedtime as needed for mild constipation. Patient not taking: Reported on 07/19/2019  10/07/18   Vaughan Basta, MD    Physical Exam: Vitals:   07/18/19 2007 07/18/19 2014 07/19/19 0035 07/19/19 0411  BP: (!) 157/78  (!) 176/81 (!) 177/90  Pulse: 76  79 78  Resp: 18  16 16   Temp: 97.9 F (36.6 C)  98.5 F (36.9 C)   TempSrc: Oral  Oral   SpO2: 99%  100% 100%  Height:  5\' 9"  (1.753 m)       Vitals:   07/18/19 2007 07/18/19 2014 07/19/19 0035 07/19/19 0411  BP: (!) 157/78   (!) 176/81 (!) 177/90  Pulse: 76  79 78  Resp: 18  16 16   Temp: 97.9 F (36.6 C)  98.5 F (36.9 C)   TempSrc: Oral  Oral   SpO2: 99%  100% 100%  Height:  5\' 9"  (1.753 m)      Constitutional: Alert and awake, oriented x3, not in any acute distress. Eyes: PERLA, EOMI, irises appear normal, anicteric sclera,  ENMT: external ears and nose appear normal, normal hearing             Lips appears normal, oropharynx mucosa, tongue, posterior pharynx appear normal  Neck: neck appears normal, no masses, normal ROM, no thyromegaly, no JVD  CVS: S1-S2 clear, no murmur rubs or gallops,  , no carotid bruits, pedal pulses palpable, No LE edema Respiratory:  clear to auscultation bilaterally, no wheezing, rales or rhonchi. Respiratory effort normal. No accessory muscle use.  Abdomen: soft nontender, nondistended, normal bowel sounds, no hepatosplenomegaly, no hernias Musculoskeletal: : no cyanosis, clubbing , no contractures or atrophy Neuro: Cranial nerves II-XII intact, sensation, reflexes normal, strength Psych: judgement and insight appear normal, stable mood and affect,  Skin: no rashes or lesions or ulcers, no induration or nodules   Labs on Admission: I have personally reviewed following labs and imaging studies  CBC: Recent Labs  Lab 07/18/19 2034  WBC 3.3*  HGB 14.1  HCT 41.6  MCV 97.0  PLT 374   Basic Metabolic Panel: Recent Labs  Lab 07/18/19 2034  NA 141  K 4.6  CL 105  CO2 25  GLUCOSE 212*  BUN 13  CREATININE 1.50*  CALCIUM 9.7   GFR: CrCl cannot be calculated (Unknown ideal weight.). Liver Function Tests: No results for input(s): AST, ALT, ALKPHOS, BILITOT, PROT, ALBUMIN in the last 168 hours. No results for input(s): LIPASE, AMYLASE in the last 168 hours. No results for input(s): AMMONIA in the last 168 hours. Coagulation Profile: No results for input(s): INR, PROTIME in the last 168 hours. Cardiac Enzymes: No results for input(s): CKTOTAL, CKMB, CKMBINDEX,  TROPONINI in the last 168 hours. BNP (last 3 results) No results for input(s): PROBNP in the last 8760 hours. HbA1C: No results for input(s): HGBA1C in the last 72 hours. CBG: Recent Labs  Lab 07/18/19 2024  GLUCAP 176*   Lipid Profile: No results for input(s): CHOL, HDL, LDLCALC, TRIG, CHOLHDL, LDLDIRECT in the last 72 hours. Thyroid Function Tests: No results for input(s): TSH, T4TOTAL, FREET4, T3FREE, THYROIDAB in the last 72 hours. Anemia Panel: No results for input(s): VITAMINB12, FOLATE, FERRITIN, TIBC, IRON, RETICCTPCT in the last 72 hours. Urine analysis:    Component Value Date/Time   COLORURINE YELLOW (A) 07/18/2019 0050   APPEARANCEUR CLEAR (A) 07/18/2019 0050   LABSPEC 1.024 07/18/2019 0050   PHURINE 5.0 07/18/2019 0050   GLUCOSEU 150 (A) 07/18/2019 0050   HGBUR MODERATE (A) 07/18/2019 0050   BILIRUBINUR NEGATIVE 07/18/2019 0050   KETONESUR 20 (  A) 07/18/2019 0050   PROTEINUR >=300 (A) 07/18/2019 0050   NITRITE NEGATIVE 07/18/2019 0050   LEUKOCYTESUR NEGATIVE 07/18/2019 0050    Radiological Exams on Admission: CT Head Wo Contrast  Result Date: 07/19/2019 CLINICAL DATA:  Lightheadedness. EXAM: CT HEAD WITHOUT CONTRAST TECHNIQUE: Contiguous axial images were obtained from the base of the skull through the vertex without intravenous contrast. COMPARISON:  CT head dated October 02, 2018. FINDINGS: Brain: No evidence of acute infarction, hemorrhage, hydrocephalus, extra-axial collection or mass lesion/mass effect. Atrophy and chronic microvascular ischemic changes are noted. Vascular: No hyperdense vessel or unexpected calcification. Skull: There is soft tissue swelling along the posterior left scalp evidence for an underlying fracture. Sinuses/Orbits: No acute finding. Other: None. IMPRESSION: 1. No acute intracranial pathology. 2. Soft tissue swelling along the posterior left scalp without evidence of an underlying fracture. Electronically Signed   By: Constance Holster M.D.    On: 07/19/2019 01:13    EKG: Independently reviewed.   Assessment/Plan Active Problems:   Dizziness   Unsteady gait when walking   History of CVA (cerebrovascular accident) -Dizziness and unsteady gait possibly related to vertigo but in view of CVA history, will do stroke work-up -Arrived outside [facility] window. Symptoms present for 10 h prior to arrival -Cardiac monitoring, echocardiogram and carotid Doppler -Follow-up MRI -Continue home aspirin. Continue Lipitor, add Plavix if ruled in for CVA -Permissive hypertension -PT and OT evaluations -Patient received meclizine in the emergency room without improvement -Neurology consult    Type 2 diabetes mellitus with stage 3 chronic kidney disease (HCC) -Sliding scale insulin pending med rec  Elevated troponin CAD (coronary artery disease) -No complaints of chest pain and no EKG changes but troponin bumped from 25-38 -If severe ruled in then could be etiology of troponin bump -Continue aspirin, antiplatelets. Not currently on beta-blocker    HTN (hypertension) -Continue diltiazem and lisinopril    H/O right nephrectomy CKD 3 -Renal function at baseline    DVT prophylaxis: Lovenox  Code Status: full code  Family Communication:  Wife at bedside  Disposition Plan: Back to previous home environment Consults called: neurology  Status:obs    Athena Masse MD [facility]     07/19/2019, 5:14 AM

## 2019-07-19 NOTE — ED Notes (Signed)
Ambulated patient. Pt was unsteady on his feet and his gait was very slow. Pt was able to walk the length of his room and back but his wife stated that he is normally very steady on his feet and walks at a much quicker pace.

## 2019-07-19 NOTE — Consult Note (Addendum)
Requesting Physician: Mal Misty    Chief Complaint: Dizziness  I have been asked by Dr. Mal Misty to see this patient in consultation for stroke.  HPI: Steven Ingram is an 69 y.o. male with medical history significant for DM 2, GERD, BPH, HTN, CAD status post stent, history of CVA with residual confusion, history right nephrectomy and CKD 3 who presented to the ED with complaints of dizziness.  He describes the dizziness as vertigo.  Has some associated nausea at times.  Reports that it affects his gait and makes him off balance.   Is a poor historian and unable to give good details about LKW.  Initial NIHSS of 2.  Date last known well: 07/18/2019 Time last known well: Time: 10:00 tPA Given: No: Outside time window  Past Medical History:  Diagnosis Date  . Diabetes mellitus without complication ([place])   . High cholesterol   . Hypertension     Past Surgical History:  Procedure Laterality Date  . TEE WITHOUT CARDIOVERSION N/A 10/07/2018   Procedure: TRANSESOPHAGEAL ECHOCARDIOGRAM (TEE);  Surgeon: Teodoro Spray, MD;  Location: [facility] ORS;  Service: Cardiovascular;  Laterality: N/A;    Family history: Father with DM, HTN and CAD s/p MI.  Mother with CAD s/p MI, HTN and DM  Social History:  reports that he has been smoking cigarettes. He has a 53.00 pack-year smoking history. He has never used smokeless tobacco. He reports current alcohol use. He reports that he does not use drugs.  Allergies: No Known Allergies  Medications:  I have reviewed the patient's current medications. Prior to Admission:  Medications Prior to Admission  Medication Sig Dispense Refill Last Dose  . amLODipine (NORVASC) 5 MG tablet Take 5 mg by mouth daily.   07/17/2019 at 0800  . aspirin EC 81 MG tablet Take 1 tablet (81 mg total) by mouth daily. 150 tablet 2 07/17/2019 at 0800  . atorvastatin (LIPITOR) 40 MG tablet Take 40 mg by mouth daily.   Past Week at Unknown time  . glipiZIDE (GLUCOTROL XL) 10 MG 24 hr tablet  Take 10 mg by mouth daily with breakfast.   07/17/2019 at 0800  . INVOKANA 100 MG TABS tablet Take 100 mg by mouth daily.   07/17/2019 at 0800  . lisinopril (ZESTRIL) 20 MG tablet Take 20 mg by mouth daily.   07/17/2019 at 0800  . metFORMIN (GLUCOPHAGE) 500 MG tablet Take 500 mg by mouth 2 (two) times daily with a meal.    07/17/2019 at 2000  . QUEtiapine (SEROQUEL) 25 MG tablet Take 25 mg by mouth at bedtime.   07/17/2019 at 0800  . QUEtiapine (SEROQUEL) 50 MG tablet Take 50 mg by mouth at bedtime.   07/17/2019 at 2000  . tamsulosin (FLOMAX) 0.4 MG CAPS capsule Take 0.4 mg by mouth 2 (two) times a day.   07/17/2019 at 2200  . diltiazem (CARDIZEM) 60 MG tablet Take 1 tablet (60 mg total) by mouth every 8 (eight) hours. 90 tablet 0   . senna (SENOKOT) 8.6 MG tablet Take 1 tablet by mouth daily.   Not Taking at Unknown time  . senna-docusate (SENOKOT-S) 8.6-50 MG tablet Take 1 tablet by mouth at bedtime as needed for mild constipation. (Patient not taking: Reported on 07/19/2019) 30 tablet 0 Not Taking at Unknown time   Scheduled: .  stroke: mapping our early stages of recovery book   Does not apply Once  . aspirin EC  81 mg Oral Daily  . atorvastatin  40 mg  Oral Daily  . enoxaparin (LOVENOX) injection  40 mg Subcutaneous Q24H  . insulin aspart  0-15 Units Subcutaneous TID WC  . insulin aspart  0-5 Units Subcutaneous QHS  . sodium chloride flush  3 mL Intravenous Once    ROS: History obtained from the patient  General ROS: negative for - chills, fatigue, fever, night sweats, weight gain or weight loss Psychological ROS: negative for - behavioral disorder, hallucinations, memory difficulties, mood swings or suicidal ideation Ophthalmic ROS: negative for - blurry vision, double vision, eye pain or loss of vision ENT ROS: vertigo Allergy and Immunology ROS: negative for - hives or itchy/watery eyes Hematological and Lymphatic ROS: negative for - bleeding problems, bruising or swollen lymph nodes Endocrine  ROS: negative for - galactorrhea, hair pattern changes, polydipsia/polyuria or temperature intolerance Respiratory ROS: negative for - cough, hemoptysis, shortness of breath or wheezing Cardiovascular ROS: negative for - chest pain, dyspnea on exertion, edema or irregular heartbeat Gastrointestinal ROS: nausea Genito-Urinary ROS: negative for - dysuria, hematuria, incontinence or urinary frequency/urgency Musculoskeletal ROS: negative for - joint swelling or muscular weakness Neurological ROS: as noted in HPI Dermatological ROS: negative for rash and skin lesion changes  Physical Examination: Blood pressure (!) 174/82, pulse 68, temperature 98.7 F (37.1 C), temperature source Oral, resp. rate 17, height 5\' 9"  (1.753 m), SpO2 98 %.  HEENT-  Normocephalic, no lesions, without obvious abnormality.  Normal external eye and conjunctiva.  Normal TM's bilaterally.  Normal auditory canals and external ears. Normal external nose, mucus membranes and septum.  Normal pharynx. Cardiovascular- S1, S2 normal, pulses palpable throughout   Lungs- chest clear, no wheezing, rales, normal symmetric air entry Abdomen- soft, non-tender; bowel sounds normal; no masses,  no organomegaly Extremities- no edema Lymph-no adenopathy palpable Musculoskeletal-no joint tenderness, deformity or swelling Skin-warm and dry, no hyperpigmentation, vitiligo, or suspicious lesions  Neurological Examination   Mental Status: Alert.  Speech fluent but patient slow to respond.  Able to follow 3 step commands without difficulty. Cranial Nerves: II: Visual fields grossly normal, pupils equal, round, reactive to light and accommodation III,IV, VI: ptosis not present, extra-ocular motions intact bilaterally V,VII: smile symmetric, facial light touch sensation normal bilaterally VIII: hearing normal bilaterally IX,X: gag reflex present XI: bilateral shoulder shrug XII: midline tongue extension Motor: Right : Upper extremity    5/5    Left:     Upper extremity   5/5  Lower extremity   5/5     Lower extremity   5/5 Tone and bulk:normal tone throughout; no atrophy noted Sensory: Pinprick and light touch intact throughout, bilaterally Deep Tendon Reflexes: Symmetric throughout Plantars: Right: mute   Left: mute Cerebellar: Normal finger-to-nose and normal heel-to-shin testing bilaterally Gait: not tested due to safety concerns    Laboratory Studies:  Basic Metabolic Panel: Recent Labs  Lab 07/18/19 2034 07/19/19 0559  NA 141  --   K 4.6  --   CL 105  --   CO2 25  --   GLUCOSE 212*  --   BUN 13  --   CREATININE 1.50* 1.32*  CALCIUM 9.7  --     Liver Function Tests: No results for input(s): AST, ALT, ALKPHOS, BILITOT, PROT, ALBUMIN in the last 168 hours. No results for input(s): LIPASE, AMYLASE in the last 168 hours. No results for input(s): AMMONIA in the last 168 hours.  CBC: Recent Labs  Lab 07/18/19 2034  WBC 3.3*  HGB 14.1  HCT 41.6  MCV 97.0  PLT 183  Cardiac Enzymes: No results for input(s): CKTOTAL, CKMB, CKMBINDEX, TROPONINI in the last 168 hours.  BNP: Invalid input(s): POCBNP  CBG: Recent Labs  Lab 07/18/19 2024 07/19/19 0827 07/19/19 0924  GLUCAP 176* 157* 152*    Microbiology: Results for orders placed or performed during the hospital encounter of 10/02/18  SARS Coronavirus 2 (CEPHEID - Performed in [place] hospital lab), Hosp Order     Status: None   Collection Time: 10/02/18  8:09 PM   Specimen: Nasopharyngeal Swab  Result Value Ref Range Status   SARS Coronavirus 2 NEGATIVE NEGATIVE Final    Comment: (NOTE) If result is NEGATIVE SARS-CoV-2 target nucleic acids are NOT DETECTED. The SARS-CoV-2 RNA is generally detectable in upper and lower  respiratory specimens during the acute phase of infection. The lowest  concentration of SARS-CoV-2 viral copies this assay can detect is 250  copies / mL. A negative result does not preclude SARS-CoV-2 infection   and should not be used as the sole basis for treatment or other  patient management decisions.  A negative result may occur with  improper specimen collection / handling, submission of specimen other  than nasopharyngeal swab, presence of viral mutation(s) within the  areas targeted by this assay, and inadequate number of viral copies  (<250 copies / mL). A negative result must be combined with clinical  observations, patient history, and epidemiological information. If result is POSITIVE SARS-CoV-2 target nucleic acids are DETECTED. The SARS-CoV-2 RNA is generally detectable in upper and lower  respiratory specimens dur ing the acute phase of infection.  Positive  results are indicative of active infection with SARS-CoV-2.  Clinical  correlation with patient history and other diagnostic information is  necessary to determine patient infection status.  Positive results do  not rule out bacterial infection or co-infection with other viruses. If result is PRESUMPTIVE POSTIVE SARS-CoV-2 nucleic acids MAY BE PRESENT.   A presumptive positive result was obtained on the submitted specimen  and confirmed on repeat testing.  While 2019 novel coronavirus  (SARS-CoV-2) nucleic acids may be present in the submitted sample  additional confirmatory testing may be necessary for epidemiological  and / or clinical management purposes  to differentiate between  SARS-CoV-2 and other Sarbecovirus currently known to infect humans.  If clinically indicated additional testing with an alternate test  methodology [phone]) is advised. The SARS-CoV-2 RNA is generally  detectable in upper and lower respiratory sp ecimens during the acute  phase of infection. The expected result is Negative. Fact Sheet for Patients:  StrictlyIdeas.no Fact Sheet for Healthcare Providers: BankingDealers.co.za This test is not yet approved or cleared by the [place] FDA and has  been authorized for detection and/or diagnosis of SARS-CoV-2 by FDA under an Emergency Use Authorization (EUA).  This EUA will remain in effect (meaning this test can be used) for the duration of the COVID-19 declaration under Section 564(b)(1) of the Act, 21 U.S.C. section 360bbb-3(b)(1), unless the authorization is terminated or revoked sooner. Performed at [facility], [address] [place], [place] [address]     Coagulation Studies: No results for input(s): LABPROT, INR in the last 72 hours.  Urinalysis:  Recent Labs  Lab 07/18/19 0050  COLORURINE YELLOW*  LABSPEC 1.024  PHURINE 5.0  GLUCOSEU 150*  HGBUR MODERATE*  BILIRUBINUR NEGATIVE  KETONESUR 20*  PROTEINUR >=300*  NITRITE NEGATIVE  LEUKOCYTESUR NEGATIVE    Lipid Panel:    Component Value Date/Time   CHOL 122 10/03/2018 0440   TRIG  76 10/03/2018 0440   HDL 42 10/03/2018 0440   CHOLHDL 2.9 10/03/2018 0440   VLDL 15 10/03/2018 0440   LDLCALC 65 10/03/2018 0440    HgbA1C:  Lab Results  Component Value Date   HGBA1C 5.5 10/03/2018    Urine Drug Screen:  No results found for: LABOPIA, COCAINSCRNUR, LABBENZ, AMPHETMU, THCU, LABBARB  Alcohol Level: No results for input(s): ETH in the last 168 hours.  Other results: EKG: sinus rhythm at 82 bpm.  Imaging: CT Head Wo Contrast  Result Date: 07/19/2019 CLINICAL DATA:  Lightheadedness. EXAM: CT HEAD WITHOUT CONTRAST TECHNIQUE: Contiguous axial images were obtained from the base of the skull through the vertex without intravenous contrast. COMPARISON:  CT head dated October 02, 2018. FINDINGS: Brain: No evidence of acute infarction, hemorrhage, hydrocephalus, extra-axial collection or mass lesion/mass effect. Atrophy and chronic microvascular ischemic changes are noted. Vascular: No hyperdense vessel or unexpected calcification. Skull: There is soft tissue swelling along the posterior left scalp evidence for an underlying fracture. Sinuses/Orbits: No acute  finding. Other: None. IMPRESSION: 1. No acute intracranial pathology. 2. Soft tissue swelling along the posterior left scalp without evidence of an underlying fracture. Electronically Signed   By: Constance Holster M.D.   On: 07/19/2019 01:13   MR BRAIN WO CONTRAST  Result Date: 07/19/2019 CLINICAL DATA:  Ataxia and possible stroke EXAM: MRI HEAD WITHOUT CONTRAST TECHNIQUE: Multiplanar, multiecho pulse sequences of the brain and surrounding structures were obtained without intravenous contrast. COMPARISON:  None. FINDINGS: Brain: There is a small focus of abnormal diffusion restriction within the left paramedian pons. There is also a small focus of diffusion restriction at the right frontoparietal junction. There is multifocal periventricular white matter hyperintensity, most often a result of chronic microvascular ischemia. Advanced atrophy for age. Normal midline structures. Vascular: Normal flow voids. Skull and upper cervical spine: Normal marrow signal. Sinuses/Orbits: Negative. Other: None. IMPRESSION: 1. Small foci of acute ischemia within the left paramedian pons and right frontoparietal junction. No hemorrhage or mass effect. 2. Advanced atrophy and findings of chronic microvascular ischemia. Electronically Signed   By: Ulyses Jarred M.D.   On: 07/19/2019 06:01   US Carotid Bilateral (at [facility] and [facility] only)  Result Date: 07/19/2019 CLINICAL DATA:  Hypertension, stroke, hyperlipidemia EXAM: BILATERAL CAROTID DUPLEX ULTRASOUND TECHNIQUE: Pearline Cables scale imaging, color Doppler and duplex ultrasound were performed of bilateral carotid and vertebral arteries in the neck. COMPARISON:  10/03/2018 FINDINGS: Criteria: Quantification of carotid stenosis is based on velocity parameters that correlate the residual internal carotid diameter with NASCET-based stenosis levels, using the diameter of the distal internal carotid lumen as the denominator for stenosis measurement. The following velocity measurements were  obtained: RIGHT ICA: 67/16 cm/sec CCA: 25/85 cm/sec SYSTOLIC ICA/CCA RATIO:  0.9 ECA: 73 cm/sec LEFT ICA: 79/15 cm/sec CCA: 27/78 cm/sec SYSTOLIC ICA/CCA RATIO:  1.1 ECA: 75 cm/sec RIGHT CAROTID ARTERY: Minor echogenic shadowing plaque formation. No hemodynamically significant right ICA stenosis, velocity elevation, or turbulent flow. Degree of narrowing less than 50%. RIGHT VERTEBRAL ARTERY:  Antegrade LEFT CAROTID ARTERY: Similar scattered minor echogenic plaque formation. No hemodynamically significant left ICA stenosis, velocity elevation, or turbulent flow. LEFT VERTEBRAL ARTERY:  Antegrade IMPRESSION: Stable carotid atherosclerosis. No hemodynamically significant ICA stenosis. Degree of narrowing less than 50% bilaterally by ultrasound criteria. Patent antegrade vertebral flow bilaterally Electronically Signed   By: Jerilynn Mages.  Shick M.D.   On: 07/19/2019 08:30    Assessment: 69 y.o. male with medical history significant for DM 2, GERD, BPH, HTN,  CAD status post stent, history of CVA with residual confusion, history right nephrectomy and CKD 3 who presented to the ED with complaints of dizziness.  MRI of the brain personally reviewed and shows a small, acute left paramedian pontine infarct.  Etiology likely small vessel disease.  Patient has had a work up for stroke in June of 2020.  TEE at that time showed a redundant atrial septum but no evidence of PFO.  Patient placed on ASA at that time.  Patient remains on ASA and a statin. Carotid dopplers show no evidence of hemodynamically significant stenosis.  Echocardiogram pending.  A1c 5.1, LDL pending.   Stroke Risk Factors - diabetes mellitus, hypertension and smoking  Plan: 1. Fasting lipid panel pending.  Goal LDL<70.   2. PT consult, OT consult, Speech consult 3. Echocardiogram pending 4. Prophylactic therapy-Dual antiplatelet therapy with ASA 81mg  and Plavix 75mg  for three weeks with change to Plavix 75mg  daily alone as monotherapy after that time. 5.  NPO until RN stroke swallow screen 6. Telemetry monitoring 7. Frequent neuro checks 8. Smoking cessation counseling 9. BP control with target BP<140/80  Alexis Goodell, MD Neurology [phone] 07/19/2019, 10:43 AM

## 2019-07-19 NOTE — Evaluation (Signed)
Physical Therapy Evaluation Patient Details Name: Steven Ingram MRN: [id number] DOB: 12-21-1950 Today's Date: 07/19/2019   History of Present Illness  Steven Ingram is a 25ZTI who comes [facility] 4/3 c dizziness and unsteady gait. In ED exam revealing of LUE ataxia and horizontal nystagmus. Pt confused and disoriented in ED, later reported as baseline per wife. In ED pt has no functional improvement with meclizine. PMH: dizziness, gait unsteadness, CVA (june 2020), DM2, CAD, HTN. MRI report includes the following: "Small foci of acute ischemia within the left paramedian pons and right frontoparietal junction."  Clinical Impression  Pt admitted with above diagnosis. Pt currently with functional limitations due to the deficits listed below (see "PT Problem List"). Upon entry, pt in bed, awake and agreeable to participate. MD/RN in room upon entry, COVID test pending. The pt is alert, welcoming, joyful, conversational, but is fairly disoriented to his current circumstances. Pt denies any dizziness or pain. Caregiver unavailable at time of evaluation to provide home setup information. Strength assessment in 4 limbs reveals no obvious abnormality. Pt has acute severe loss of ability to balance, but with a RW he is able to perform transfers and household distance AMB with close supervision and intermittent minA for sway correction. Pt's cognitive impairment makes his imbalance and unsteadiness much more dangerous, as his awareness of these deficits is limited, his capacity to correct or anticipate movement aberrancy is delayed. Pt likely will need close supervision at DC for safety. Functional mobility assessment demonstrates increased effort/time requirements, poor tolerance, and need for physical assistance, whereas the patient performed these at a higher level of independence PTA. Pt will benefit from skilled PT intervention to increase independence and safety with basic mobility in preparation for discharge to  the venue listed below.       Follow Up Recommendations Home health PT;Supervision for mobility/OOB;Supervision/Assistance - 24 hour    Equipment Recommendations  Rolling walker with 5" wheels    Recommendations for Other Services       Precautions / Restrictions Precautions Precautions: Fall      Mobility  Bed Mobility Overal bed mobility: Independent                Transfers Overall transfer level: Needs assistance Equipment used: Rolling walker (2 wheeled) Transfers: Sit to/from Stand Sit to Stand: Min guard         General transfer comment: attempted to rise without device, strenght not limiting, but unable to achieve steadiness required for full upright stance; able to do this with RW, then balance balance hands free for urinal use, author provides intermittent minA to correct postural sway exceeding into posterior lean.  Ambulation/Gait Ambulation/Gait assistance: Min guard Gait Distance (Feet): 50 Feet Assistive device: Rolling walker (2 wheeled) Gait Pattern/deviations: Ataxic     General Gait Details: impaired hip right strategy in gait, most pronounced in turning with RW; multimodal cues for safe RW use. Distance not limited, but remaining in room as COVID test is pending.  Stairs            Wheelchair Mobility    Modified Rankin (Stroke Patients Only)       Balance Overall balance assessment: Needs assistance Sitting-balance support: No upper extremity supported;Feet unsupported Sitting balance-Leahy Scale: Good     Standing balance support: During functional activity;Bilateral upper extremity supported Standing balance-Leahy Scale: Poor Standing balance comment: poor awareness of deficits  Pertinent Vitals/Pain Pain Assessment: No/denies pain    Home Living Family/patient expects to be discharged to:: Private residence Living Arrangements: Spouse/significant other(girlfriend)                Additional Comments: need more updates once caregiver is available.    Prior Function                 Hand Dominance   Dominant Hand: Left    Extremity/Trunk Assessment   Upper Extremity Assessment Upper Extremity Assessment: Overall WFL for tasks assessed(Elbows, grips = bilat, WNL)    Lower Extremity Assessment Lower Extremity Assessment: Overall WFL for tasks assessed(hips, ankles, knees all =bilat)       Communication      Cognition Arousal/Alertness: Awake/alert Behavior During Therapy: WFL for tasks assessed/performed Overall Cognitive Status: History of cognitive impairments - at baseline                                 General Comments: no extensive questioning attempted, given well-documented cognitive impairment.      General Comments      Exercises     Assessment/Plan    PT Assessment Patient needs continued PT services  PT Problem List Decreased strength;Decreased range of motion;Decreased activity tolerance;Decreased balance;Decreased mobility;Decreased cognition;Decreased knowledge of precautions;Decreased safety awareness;Decreased knowledge of use of DME       PT Treatment Interventions DME instruction;Balance training;Gait training;Stair training;Functional mobility training;Therapeutic activities;Therapeutic exercise;Patient/family education;Cognitive remediation;Neuromuscular re-education    PT Goals (Current goals can be found in the Care Plan section)  Acute Rehab PT Goals PT Goal Formulation: Patient unable to participate in goal setting Time For Goal Achievement: 08/02/19 Potential to Achieve Goals: Good    Frequency 7X/week   Barriers to discharge        Co-evaluation               AM-PAC PT "6 Clicks" Mobility  Outcome Measure Help needed turning from your back to your side while in a flat bed without using bedrails?: None Help needed moving from lying on your back to sitting on the side of a flat  bed without using bedrails?: None Help needed moving to and from a bed to a chair (including a wheelchair)?: A Little Help needed standing up from a chair using your arms (e.g., wheelchair or bedside chair)?: A Little Help needed to walk in hospital room?: A Little Help needed climbing 3-5 steps with a railing? : A Lot 6 Click Score: 19    End of Session Equipment Utilized During Treatment: Gait belt Activity Tolerance: Patient tolerated treatment well;No increased pain Patient left: in bed;with call bell/phone within reach;with bed alarm set Nurse Communication: Mobility status PT Visit Diagnosis: Unsteadiness on feet (R26.81);Difficulty in walking, not elsewhere classified (R26.2);Other abnormalities of gait and mobility (R26.89);Ataxic gait (R26.0)    Time: 4665-9935 PT Time Calculation (min) (ACUTE ONLY): 16 min   Charges:   PT Evaluation $PT Eval Low Complexity: 1 Low          10:57 AM, 07/19/19 Etta Grandchild, PT, DPT Physical Therapist - [facility]  [phone] ([place])   [place] 07/19/2019, 10:51 AM

## 2019-07-20 ENCOUNTER — Inpatient Hospital Stay (HOSPITAL_COMMUNITY)
Admit: 2019-07-20 | Discharge: 2019-07-20 | Disposition: A | Discharge status: Home / Self Care | Payer: Medicare Other | Attending: Internal Medicine | Admitting: Internal Medicine

## 2019-07-20 DIAGNOSIS — R42 Dizziness and giddiness: Secondary | ICD-10-CM | POA: Diagnosis not present

## 2019-07-20 DIAGNOSIS — I6389 Other cerebral infarction: Secondary | ICD-10-CM | POA: Diagnosis not present

## 2019-07-20 DIAGNOSIS — E440 Moderate protein-calorie malnutrition: Secondary | ICD-10-CM

## 2019-07-20 DIAGNOSIS — I639 Cerebral infarction, unspecified: Secondary | ICD-10-CM

## 2019-07-20 HISTORY — DX: Moderate protein-calorie malnutrition: E44.0

## 2019-07-20 LAB — LIPID PANEL
Cholesterol: 170 mg/dL (ref 0–200)
HDL: 60 mg/dL (ref >40)
LDL Cholesterol: 94 mg/dL (ref 0–99)
Total CHOL/HDL Ratio: 2.8 RATIO
Triglycerides: 81 mg/dL (ref <150)
VLDL: 16 mg/dL (ref 0–40)

## 2019-07-20 LAB — GLUCOSE, CAPILLARY
Glucose-Capillary: 138 mg/dL — ABNORMAL HIGH (ref 70–99)
Glucose-Capillary: 169 mg/dL — ABNORMAL HIGH (ref 70–99)
Glucose-Capillary: 158 mg/dL — ABNORMAL HIGH (ref 70–99)

## 2019-07-20 LAB — ECHOCARDIOGRAM COMPLETE

## 2019-07-20 MED ORDER — DILTIAZEM HCL 60 MG PO TABS: 60.0000 mg | ORAL_TABLET | Freq: Two times a day (BID) | ORAL | 0 refills | Status: DC

## 2019-07-20 MED ORDER — PERFLUTREN LIPID MICROSPHERE
1.0000 mL | INTRAVENOUS | Status: AC | PRN
  Administered 2019-07-20: 08:00:00 2 mL via INTRAVENOUS
  Filled 2019-07-20: qty 10

## 2019-07-20 MED ORDER — DILTIAZEM HCL 60 MG PO TABS: 60.0000 mg | ORAL_TABLET | Freq: Two times a day (BID) | ORAL | Status: DC

## 2019-07-20 MED ORDER — ASPIRIN EC 81 MG PO TBEC: 81.0000 mg | DELAYED_RELEASE_TABLET | Freq: Every day | ORAL | Status: AC

## 2019-07-20 MED ORDER — CLOPIDOGREL BISULFATE 75 MG PO TABS: 75.0000 mg | ORAL_TABLET | Freq: Every day | ORAL | 0 refills | Status: DC

## 2019-07-20 MED ORDER — QUETIAPINE FUMARATE 25 MG PO TABS: 25.0000 mg | ORAL_TABLET | Freq: Every day | ORAL | Status: DC

## 2019-07-20 NOTE — TOC Progression Note (Signed)
Transition of Care [facility]) - Progression Note    Patient Details  Name: Steven Ingram MRN: [id number] Date of Birth: 04/26/1950  Transition of Care [facility]) CM/SW [place], RN Phone Number: 07/20/2019, 12:05 PM  Clinical Narrative:    Received a message from the patient's wife [name], Attempted to call her back and got the VM, Left a message with my contact information for a call back        Expected Discharge Plan and Services                                                 Social Determinants of Health (SDOH) Interventions    Readmission Risk Interventions Readmission Risk Prevention Plan 10/04/2018  Post Dischage Appt Not Complete  Medication Screening Complete  Transportation Screening Complete

## 2019-07-20 NOTE — Progress Notes (Signed)
Physical Therapy Treatment Patient Details Name: Steven Ingram MRN: [id number] DOB: [birth year] Today's Date: 07/20/2019    History of Present Illness Steven Ingram is a 89WNI who comes to [facility] 4/3 c dizziness and unsteady gait. In ED exam revealing of LUE ataxia and horizontal nystagmus. Pt confused and disoriented in ED, later reported as baseline per wife. In ED pt has no functional improvement with meclizine. PMH: dizziness, gait unsteadness, CVA (june 2020), DM2, CAD, HTN. MRI report includes the following: "Small foci of acute ischemia within the left paramedian pons and right frontoparietal junction."    PT Comments    Pt in room with sitter confused and difficult for her to re-direct.  Pt looking for his boss at the [employer].  Pt taken for walk in hallway x 4 1/2 laps with RW for balance.  He has once incident of LOB where he required min a x 1 to correct but he attributed it to stepping on his sock during turn.  Overall does well and re-directs with mod a but does voice overall frustration of not being able to work on a nice day and that we are taking his money.  He does show some fatigue during last 1 1/2 laps but he denies.  Remained in recliner with sitter in room and updated sitter on mobility.  He is safe to walk with staff and +1 assist and RW in hallways.    Follow Up Recommendations  Home health PT;Supervision for mobility/OOB;Supervision/Assistance - 24 hour     Equipment Recommendations  Rolling walker with 5" wheels    Recommendations for Other Services       Precautions / Restrictions Precautions Precautions: Fall    Mobility  Bed Mobility Overal bed mobility: Independent                Transfers Overall transfer level: Needs assistance Equipment used: Rolling walker (2 wheeled) Transfers: Sit to/from Stand Sit to Stand: Supervision;Min guard            Ambulation/Gait Ambulation/Gait assistance: Supervision;Min guard Gait Distance  (Feet): 900 Feet Assistive device: Rolling walker (2 wheeled)   Gait velocity: decreased   General Gait Details: generally steady with RW but does have one LOB when turning when he said he stepped on his sock.   Stairs             Wheelchair Mobility    Modified Rankin (Stroke Patients Only)       Balance Overall balance assessment: Needs assistance Sitting-balance support: No upper extremity supported;Feet supported Sitting balance-Leahy Scale: Normal     Standing balance support: Bilateral upper extremity supported Standing balance-Leahy Scale: Fair Standing balance comment: poor awareness of deficits                            Cognition Arousal/Alertness: Awake/alert Behavior During Therapy: Impulsive;Restless;Agitated Overall Cognitive Status: No family/caregiver present to determine baseline cognitive functioning                                        Exercises      General Comments        Pertinent Vitals/Pain Pain Assessment: No/denies pain    Home Living                      Prior Function  PT Goals (current goals can now be found in the care plan section) Progress towards PT goals: Progressing toward goals    Frequency    7X/week      PT Plan Current plan remains appropriate    Co-evaluation              AM-PAC PT "6 Clicks" Mobility   Outcome Measure  Help needed turning from your back to your side while in a flat bed without using bedrails?: None Help needed moving from lying on your back to sitting on the side of a flat bed without using bedrails?: None Help needed moving to and from a bed to a chair (including a wheelchair)?: A Little   Help needed to walk in hospital room?: A Little Help needed climbing 3-5 steps with a railing? : A Little 6 Click Score: 17    End of Session Equipment Utilized During Treatment: Gait belt Activity Tolerance: Patient tolerated treatment  well;No increased pain Patient left: in chair;with nursing/sitter in room Nurse Communication: Mobility status       Time: 5271-2929 PT Time Calculation (min) (ACUTE ONLY): 23 min  Charges:  $Gait Training: 23-37 mins                    Chesley Noon, PTA 07/20/19, 11:38 AM

## 2019-07-20 NOTE — Progress Notes (Signed)
Received Md order to discharge patient to home, reviewed home meds, discharge instructions, prescriptions and follow up appointments with patients wife and she verbalized understanding

## 2019-07-20 NOTE — TOC Progression Note (Signed)
Transition of Care [facility]) - Progression Note    Patient Details  Name: Steven Ingram MRN: [id number] Date of Birth: 08-06-1950  Transition of Care [facility]) CM/SW [place], RN Phone Number: 07/20/2019, 9:57 AM  Clinical Narrative:    Met with the patient , He is able to tell me his name and DOB, and that his wife is [name], he is not familiar with where he is or why but does know the date.  I attempted to call his wife [name] and left my number for a call back.        Expected Discharge Plan and Services                                                 Social Determinants of Health (SDOH) Interventions    Readmission Risk Interventions Readmission Risk Prevention Plan 10/04/2018  Post Dischage Appt Not Complete  Medication Screening Complete  Transportation Screening Complete

## 2019-07-20 NOTE — TOC Progression Note (Signed)
Transition of Care [facility]) - Progression Note    Patient Details  Name: Steven Ingram MRN: [id number] Date of Birth: [birth year]  Transition of Care [facility]) CM/SW [place], RN Phone Number: 07/20/2019, 12:22 PM  Clinical Narrative:     Spoke with the patient's wife [name], the patient is at his baseline cognitively, he is unsteady walking and could benefit from a RW, I contacted [place] with [employer].  The patient has refused HH in the past and she does not feel he would agree to it now, He has no other needs at this time      Expected Discharge Plan and Services                                                 Social Determinants of Health (SDOH) Interventions    Readmission Risk Interventions Readmission Risk Prevention Plan 10/04/2018  Post Dischage Appt Not Complete  Medication Screening Complete  Transportation Screening Complete

## 2019-07-20 NOTE — Discharge Summary (Signed)
Physician Discharge Summary  Steven Ingram HYW:[id number] DOB: 20-Oct-1950 DOA: 07/19/2019  PCP[facility] [place] date: 07/19/2019 Discharge date: 07/20/2019  Discharge disposition: Home with home health care   Recommendations for Outpatient Follow-Up:   Outpatient follow-up with PCP in 1 week Outpatient follow-up with neurologist in 1 month  Discharge Diagnosis:   Principal Problem:   Left pontine stroke ([place]) Active Problems:   Dizziness   Unsteady gait when walking   History of CVA (cerebrovascular accident)   Type 2 diabetes mellitus with stage 3 chronic kidney disease ([place])   CAD (coronary artery disease)   HTN (hypertension)   H/O right nephrectomy   Malnutrition of moderate degree    Discharge Condition: Stable.  Diet recommendation: Low-salt diet, diabetic diet  Code status: Full code.    Hospital Course:   Steven Ingram is an 69 y.o. male with medical history significant forDM 2, GERD, BPH, HTN, CAD status post stent, history of CVA with residual confusion, history right nephrectomy and CKD stage 3a who presented to the emergency room with  lightheadedness, dizziness and unsteady gait while walking.  MRI revealed small foci of acute ischemia in the left paramedian pons and right frontoparietal junction.  She was evaluated by the neurologist who recommended dual antiplatelet therapy with aspirin and Plavix for 3 weeks followed by Plavix monotherapy.  Patient had a transient episode of SVT with heart rate in the 170s but he was asymptomatic from this.  2D echo showed EF estimated at 55 to 48%, grade 1 diastolic dysfunction.  He was evaluated by PT and OT recommended home physical therapy and Occupational Therapy.  Patient is back to baseline and he is deemed medically stable for discharge to home.  Discharge plan was discussed with the patient's wife.      Discharge Exam:   Vitals:   07/20/19 0944 07/20/19 1242  BP: (!) 159/89 (!)  144/90  Pulse: 90 94  Resp:    Temp:    SpO2:  100%   Vitals:   07/20/19 0517 07/20/19 0920 07/20/19 0944 07/20/19 1242  BP: (!) 165/79 (!) 188/96 (!) 159/89 (!) 144/90  Pulse: 61 82 90 94  Resp: 18 19    Temp: 98.3 F (36.8 C) 98.6 F (37 C)    TempSrc: Oral Oral    SpO2: 100% 100%  100%  Height:         GEN: NAD SKIN: No rash EYES: EOMI ENT: MMM CV: RRR PULM: CTA B ABD: soft, ND, NT, +BS CNS: AAO x 1 (person), non focal EXT: No edema or tenderness   The results of significant diagnostics from this hospitalization (including imaging, microbiology, ancillary and laboratory) are listed below for reference.     Procedures and Diagnostic Studies:   No results found.   Labs:   Basic Metabolic Panel: Recent Labs  Lab 07/18/19 2034 07/19/19 0559  NA 141  --   K 4.6  --   CL 105  --   CO2 25  --   GLUCOSE 212*  --   BUN 13  --   CREATININE 1.50* 1.32*  CALCIUM 9.7  --    GFR CrCl cannot be calculated (Unknown ideal weight.). Liver Function Tests: No results for input(s): AST, ALT, ALKPHOS, BILITOT, PROT, ALBUMIN in the last 168 hours. No results for input(s): LIPASE, AMYLASE in the last 168 hours. No results for input(s): AMMONIA in the last 168 hours. Coagulation profile No results for input(s): INR, PROTIME in  the last 168 hours.  CBC: Recent Labs  Lab 07/18/19 2034  WBC 3.3*  HGB 14.1  HCT 41.6  MCV 97.0  PLT 183   Cardiac Enzymes: No results for input(s): CKTOTAL, CKMB, CKMBINDEX, TROPONINI in the last 168 hours. BNP: Invalid input(s): POCBNP CBG: Recent Labs  Lab 07/19/19 1641 07/19/19 2120 07/20/19 0849 07/20/19 1153 07/20/19 1634  GLUCAP 120* 121* 138* 169* 158*   D-Dimer No results for input(s): DDIMER in the last 72 hours. Hgb A1c Recent Labs    07/19/19 0559  HGBA1C 5.1   Lipid Profile Recent Labs    07/20/19 0356  CHOL 170  HDL 60  LDLCALC 94  TRIG 81  CHOLHDL 2.8   Thyroid function studies No results for  input(s): TSH, T4TOTAL, T3FREE, THYROIDAB in the last 72 hours.  Invalid input(s): FREET3 Anemia work up No results for input(s): VITAMINB12, FOLATE, FERRITIN, TIBC, IRON, RETICCTPCT in the last 72 hours. Microbiology Recent Results (from the past 240 hour(s))  SARS CORONAVIRUS 2 (TAT 6-24 HRS) Nasopharyngeal Nasopharyngeal Swab     Status: None   Collection Time: 07/19/19  5:59 AM   Specimen: Nasopharyngeal Swab  Result Value Ref Range Status   SARS Coronavirus 2 NEGATIVE NEGATIVE Final    Comment: (NOTE) SARS-CoV-2 target nucleic acids are NOT DETECTED. The SARS-CoV-2 RNA is generally detectable in upper and lower respiratory specimens during the acute phase of infection. Negative results do not preclude SARS-CoV-2 infection, do not rule out co-infections with other pathogens, and should not be used as the sole basis for treatment or other patient management decisions. Negative results must be combined with clinical observations, patient history, and epidemiological information. The expected result is Negative. Fact Sheet for Patients: SugarRoll.be Fact Sheet for Healthcare Providers: https://www.woods-mathews.com/ This test is not yet approved or cleared by the [place] FDA and  has been authorized for detection and/or diagnosis of SARS-CoV-2 by FDA under an Emergency Use Authorization (EUA). This EUA will remain  in effect (meaning this test can be used) for the duration of the COVID-19 declaration under Section 56 4(b)(1) of the Act, 21 U.S.C. section 360bbb-3(b)(1), unless the authorization is terminated or revoked sooner. Performed at [facility], [place] [address] [place], [place] [address]      Discharge Instructions:   Discharge Instructions    Diet - low sodium heart healthy   Complete by: As directed    Diet Carb Modified   Complete by: As directed    Discharge instructions   Complete by: As directed    Stop  Aspirin on 08/09/2019   Increase activity slowly   Complete by: As directed      Allergies as of 07/20/2019   No Known Allergies     Medication List    STOP taking these medications   amLODipine 5 MG tablet Commonly known as: NORVASC   senna-docusate 8.6-50 MG tablet Commonly known as: Senokot-S     TAKE these medications   aspirin EC 81 MG tablet Take 1 tablet (81 mg total) by mouth daily for 20 days.   atorvastatin 40 MG tablet Commonly known as: LIPITOR Take 40 mg by mouth daily.   clopidogrel 75 MG tablet Commonly known as: PLAVIX Take 1 tablet (75 mg total) by mouth daily. Start taking on: July 21, 2019   diltiazem 60 MG tablet Commonly known as: CARDIZEM Take 1 tablet (60 mg total) by mouth 2 (two) times daily. What changed: when to take this   glipiZIDE  10 MG 24 hr tablet Commonly known as: GLUCOTROL XL Take 10 mg by mouth daily with breakfast. Notes to patient: Not given during this hospital stay   Invokana 100 MG Tabs tablet Generic drug: canagliflozin Take 100 mg by mouth daily. Notes to patient: Not given during this hospital stay   lisinopril 20 MG tablet Commonly known as: ZESTRIL Take 20 mg by mouth daily.   metFORMIN 500 MG tablet Commonly known as: GLUCOPHAGE Take 500 mg by mouth 2 (two) times daily with a meal. Notes to patient: Not given during this hospital stay   QUEtiapine 50 MG tablet Commonly known as: SEROQUEL Take 50 mg by mouth at bedtime. What changed: Another medication with the same name was changed. Make sure you understand how and when to take each.   QUEtiapine 25 MG tablet Commonly known as: SEROQUEL Take 1 tablet (25 mg total) by mouth daily. What changed: when to take this   senna 8.6 MG tablet Commonly known as: SENOKOT Take 1 tablet by mouth daily. Notes to patient: Not given during this hospital stay   tamsulosin 0.4 MG Caps capsule Commonly known as: FLOMAX Take 0.4 mg by mouth 2 (two) times a day.             Durable Medical Equipment  (From admission, onward)         Start     Ordered   07/20/19 1417  For home use only DME Walker rolling  Once    Question Answer Comment  Walker: With 5 Inch Wheels   Patient needs a walker to treat with the following condition Weakness      07/20/19 [employer]. Schedule an appointment as soon as possible for a visit in 1 week(s).   Why: left message for office to call and schedule an appointment Contact information: [place] [address] [phone]            Time coordinating discharge: 28 minutes  Signed:  Jennye Boroughs  [facility] 07/20/2019, 4:53 PM

## 2019-07-20 NOTE — TOC Transition Note (Signed)
Transition of Care [facility]) - CM/SW Discharge Note   Patient Details  Name: Steven Ingram MRN: [id number] Date of Birth: 16-Apr-1951  Transition of Care [facility]) CM/SW Contact:  Su Hilt, RN Phone Number: 07/20/2019, 3:58 PM   Clinical Narrative:    Patient to DC home with his wife, he has a RW in the room.  The patient and his wife refuse Home health services.   She stated that he is at his baseline now   Final next level of care: Home/Self Care Barriers to Discharge: Continued Medical Work up   Patient Goals and CMS Choice Patient states their goals for this hospitalization and ongoing recovery are:: go home      Discharge Placement                       Discharge Plan and Services   Discharge Planning Services: CM Consult            DME Arranged: Gilford Rile rolling DME Agency: [employer] Date DME Agency Contacted: 07/20/19 Time DME Agency Contacted: 66 Representative spoke with at DME Agency: [facility] HH Arranged: Patient Refused [place]          Social Determinants of Health ([place]) Interventions     Readmission Risk Interventions Readmission Risk Prevention Plan 10/04/2018  Post Dischage Appt Not Complete  Medication Screening Complete  Transportation Screening Complete

## 2019-07-20 NOTE — Evaluation (Addendum)
Clinical/Bedside Swallow Evaluation Patient Details  Name: Steven Ingram MRN: [id number] Date of Birth: June 21, 1950  Today's Date: 07/20/2019 Time: SLP Start Time (ACUTE ONLY): 0835 SLP Stop Time (ACUTE ONLY): 0935 SLP Time Calculation (min) (ACUTE ONLY): 60 min  Past Medical History:  Past Medical History:  Diagnosis Date  . Diabetes mellitus without complication ([place])   . High cholesterol   . Hypertension    Past Surgical History:  Past Surgical History:  Procedure Laterality Date  . TEE WITHOUT CARDIOVERSION N/A 10/07/2018   Procedure: TRANSESOPHAGEAL ECHOCARDIOGRAM (TEE);  Surgeon: Teodoro Spray, MD;  Location: [facility] ORS;  Service: Cardiovascular;  Laterality: N/A;   HPI:  Pt is a 69 y.o. male with a known history of hypertension, ETOH use in past, current tobacco use, GERD, hyperlipidemia, diabetes mellitus, history of CVA with residual confusion, history right nephrectomy and CKD 3. Pt seen by ST services in 2020 w/ previous MRI revealing left posterior external capsule and right posterior medial pons; moderate chronic microvascular ischemic changes and volume loss of the brain; small chronic infarcts in the bilateral thalami and the pons. Current MRI: Small foci of acute ischemia within the left paramedian pons and right frontoparietal junction w/ Advanced Atrophy of the brain.    Assessment / Plan / Recommendation Clinical Impression  Pt apepars to present w/ adequate oropharyngeal phase swallowing function w/ no overt, clinical s/s of aspiration noted except when drinking Multiple sips too quickly. When pt followed general aspiration precautions, no overt s/s of aspiration noted - pt is at reduced risk for aspiration when following precautions. Suspect pt's swallowing and min impulsiveness is impacted by his Baseline Cognitive decline. Pt needed positioning support d/t Cognitive decline and min confusion w/ following instructions -- pt has Baseline Cognitive decline post previous  CVAs, per MRI("Advanced Atrophy"). After given support and setup, pt consumed po trials of thin liquids, purees, and softened solids w/ no coughing/throat clearing except when pt took multiple, quick sips of liquids. When pt slowed down and took smaller, single sips, no further coughing or throat clearing noted; vocal quality clear and no decline in respiratory status noted. Oral phase was grossly [facility] for bolus management and oral clearing though he tended to take larger bites of food - encouraged pt to use smaller bites and alternate foods/liquids for best oral control and clearing. Pt required min extra time for full mastication d/t missing few lower dentition. Pt fed self w/ setup assistance given. OM exam grossly [facility] for individual movements/strength w/ no unilateral weakness or decreased ROM noted. Recommend a mech soft diet for ease of self-feeding w/ general aspiration precautions. Pills WHOLE in Puree for safer swallowing -- Teach for Discharge. Tray setup and positioning upright for meals. Assistance w/ ordering meals. No further skilled ST services indicated at this time as pt appears to be at his baseline w/ swallowing and Cognitive status. Pt has Baseline Cognitive decline per previous MD notes, MRI indicating "Advanced Atrophy". If pt/family feel there are any new changes/decline, recommend f/u w/ PCP for order for f/u ST services for Cognitive-linguistic evaluation. CM/NSG to reconsult if any decline in status while admitted.  SLP Visit Diagnosis: Dysphagia, oropharyngeal phase (R13.12)(mild - suspect d/t Cognitive decline)    Aspiration Risk  Mild aspiration risk;Risk for inadequate nutrition/hydration(reduced when following directions)    Diet Recommendation  Mech Soft diet (for ease of self-feeding of cut meats) w/ Thin liquids VIA CUP. General aspiration precautions; support at meals d/t Cognitive decline. GERD precautions.   Medication  Administration: Whole meds with puree(for safer  swallowing)    Other  Recommendations Recommended Consults: (dietician f/u as needed) Oral Care Recommendations: Oral care BID;Oral care before and after PO;Staff/trained caregiver to provide oral care Other Recommendations: (n/a)   Follow up Recommendations None      Frequency and Duration (n/a)  (n/a)       Prognosis Prognosis for Safe Diet Advancement: Fair(-Good) Barriers to Reach Goals: Cognitive deficits;Time post onset;Severity of deficits      Swallow Study   General Date of Onset: 07/18/19 HPI: Pt is a 69 y.o. male with a known history of hypertension, ETOH use in past, current tobacco use, hyperlipidemia, diabetes mellitus, history of CVA with residual confusion, history right nephrectomy and CKD 3. Pt seen by ST services in 2020 w/ previous MRI revealing left posterior external capsule and right posterior medial pons; moderate chronic microvascular ischemic changes and volume loss of the brain; small chronic infarcts in the bilateral thalami and the pons. Current MRI: Small foci of acute ischemia within the left paramedian pons and right frontoparietal junction w/ Advanced Atrophy of the brain.  Type of Study: Bedside Swallow Evaluation Previous Swallow Assessment: bse, mbss in 2020 Diet Prior to this Study: Regular;Thin liquids Temperature Spikes Noted: No(wbc 3.3) Respiratory Status: Room air History of Recent Intubation: No Behavior/Cognition: Pleasant mood;Alert;Cooperative;Confused;Distractible;Requires cueing(cognitive decline) Oral Cavity Assessment: Within Functional Limits Oral Care Completed by SLP: Yes Oral Cavity - Dentition: Dentures, top(bottom dentition - missing few molars) Vision: Functional for self-feeding Self-Feeding Abilities: Able to feed self;Needs assist;Needs set up(weak LUE ) Patient Positioning: Upright in bed(needed positioning) Baseline Vocal Quality: Normal Volitional Cough: Strong Volitional Swallow: Able to elicit     Oral/Motor/Sensory Function Overall Oral Motor/Sensory Function: Within functional limits   Ice Chips Ice chips: Within functional limits Presentation: Spoon(fed; 2 trials)   Thin Liquid Thin Liquid: Impaired Presentation: Cup;Self Fed(~8 ozs total) Oral Phase Impairments: (none) Oral Phase Functional Implications: (none) Pharyngeal  Phase Impairments: Cough - Immediate(w/ multiple, large swallows) Other Comments: when pt slowed down and took smaller, single sips, no further overt s/s of aspiration noted during intake of thin liquids via CUP.    Nectar Thick Nectar Thick Liquid: Not tested   Honey Thick Honey Thick Liquid: Not tested   Puree Puree: Within functional limits Presentation: Self Fed;Spoon(4 ozs)   Solid     Solid: Within functional limits Presentation: Spoon;Self Fed(6 trials) Other Comments: min extra time for full mastication d/t missing few bottom molars       Orinda Kenner, MS, CCC-SLP [facility]avilanes 07/20/2019,10:00 AM

## 2019-07-20 NOTE — Progress Notes (Signed)
*  PRELIMINARY RESULTS* Echocardiogram 2D Echocardiogram has been performed.  Steven Ingram 07/20/2019, 8:41 AM

## 2019-07-20 NOTE — Progress Notes (Signed)
OT Cancellation Note  Patient Details Name: Steven Ingram MRN: [id number] DOB: Dec 15, 1950   Cancelled Treatment:    Reason Eval/Treat Not Completed: Other (comment)   Family in room with patient.  Patient will be discharging this PM.  Family with no questions/concerns regarding therapy at this time. Thank you.  Oren Binet 07/20/2019, 3:58 PM

## 2019-07-20 NOTE — Progress Notes (Addendum)
Initial Nutrition Assessment  DOCUMENTATION CODES:   Non-severe (moderate) malnutrition in context of chronic illness  INTERVENTION:   Recommend Ensure Enlive po TID, each supplement provides 350 kcal and 20 grams of protein  Magic cup TID with meals, each supplement provides 290 kcal and 9 grams of protein  Recommend MVI daily   NUTRITION DIAGNOSIS:   Moderate Malnutrition related to chronic illness(recurrent CVAs) as evidenced by moderate fat depletion, moderate muscle to severe muscle depletions.  GOAL:   Patient will meet greater than or equal to 90% of their needs  MONITOR:   PO intake, Supplement acceptance, Labs, Weight trends, Skin, I & O's  REASON FOR ASSESSMENT:   Malnutrition Screening Tool    ASSESSMENT:   69 y.o. male with medical history significant for DM 2, GERD, BPH, HTN, CAD status post stent, history of CVA with residual confusion, history right nephrectomy and CKD 3 who presents to the emergency room with a several hour onset, over 10 h of lightheadedness, dizziness and unsteady gait while walking.   Met with pt and pt's wife in room today. Pt is a poor historian so majority of history obtained from wife at bedside. Wife reports that pt with poor appetite and oral intake at baseline. Pt does drink some vanilla Ensure at home. Wife reports that patient has been losing weight. Per chart, pt has lost 16lbs(8%) over the past year; this is not significant. RD will add supplements and MVI to help pt meet his estimated needs. Pt ate 25% of his lunch today. Pt to possibly discharge today. No new weight in chart; pt's last documented weight was 87.2kg on 3/2. Pt sitting in chair at time of RD visit so unable to obtain new weight.   Medications reviewed and include: aspirin, plavix, lovenox, insulin, nicotine  Labs reviewed: creat 1.32(H) Wbc 3.3(L) cbgs- 138, 169 x 24 hrs AIC 5.1- 4/4  NUTRITION - FOCUSED PHYSICAL EXAM:    Most Recent Value  Orbital Region   Mild depletion  Upper Arm Region  Moderate depletion  Thoracic and Lumbar Region  Moderate depletion  Buccal Region  Moderate depletion  Temple Region  Severe depletion  Clavicle Bone Region  Moderate depletion  Clavicle and Acromion Bone Region  Moderate depletion  Scapular Bone Region  Moderate depletion  Dorsal Hand  Moderate depletion  Patellar Region  Severe depletion  Anterior Thigh Region  Severe depletion  Posterior Calf Region  Severe depletion  Edema (RD Assessment)  None  Hair  Reviewed  Eyes  Reviewed  Mouth  Reviewed  Skin  Reviewed  Nails  Reviewed     Diet Order:   Diet Order            DIET DYS 3 Room service appropriate? Yes with Assist; Fluid consistency: Thin  Diet effective now        Diet - low sodium heart healthy        Diet Carb Modified             EDUCATION NEEDS:   Education needs have been addressed  Skin:  Skin Assessment: Reviewed RN Assessment  Last BM:  4/5  Height:   Ht Readings from Last 1 Encounters:  07/18/19 5' 9"  (1.753 m)    Weight:   Wt Readings from Last 1 Encounters:  02/24/19 94.3 kg    Ideal Body Weight:  72.7 kg  BMI:  Body mass index is 30.72 kg/m.  Estimated Nutritional Needs:   Kcal:  2000-2300kcal/day  Protein:  100-115g/day  Fluid:  >1.9L/day  Koleen Distance MS, RD, LDN Please refer to [facility] for RD and/or RD on-call/weekend/after hours pager

## 2019-08-20 ENCOUNTER — Telehealth: Payer: Self-pay | Admitting: Primary Care

## 2019-08-20 NOTE — Telephone Encounter (Signed)
Called patient's daughter [name], to schedule the Palliative Consult, no answer and unable to leave message due to mailbox was full.  I then called patient's listed home number with no answer - left message with reason for call along with my name and contact number.

## 2019-08-21 ENCOUNTER — Telehealth: Payer: Self-pay | Admitting: Primary Care

## 2019-08-21 NOTE — Telephone Encounter (Signed)
Returned call to daughter Donella Stade, and after explaining Palliative services to her she stated that the only problem would be getting the patient to agree with this.  I have emailed daughter our [place] and she is going to speak with patient and wife about this and she will call me back to let me know what he has decided.

## 2019-08-31 ENCOUNTER — Telehealth: Payer: Self-pay | Admitting: Primary Care

## 2019-08-31 NOTE — Telephone Encounter (Signed)
Called daughter back to see if she spoke with patient about Palliative services and to see if he was in agreement with this, no answer - message left with my name and contact information.

## 2019-08-31 NOTE — Telephone Encounter (Signed)
Rec'd call back from patient's daughter, [name] and she said that she did speak with the patient about Palliative services.  Daughter requested the phone number for the referral MD, and I gave this information to her.  She was going to contact MD office and then call me back.

## 2019-09-04 ENCOUNTER — Telehealth: Payer: Self-pay | Admitting: Primary Care

## 2019-09-04 NOTE — Telephone Encounter (Signed)
Rec'd message that daughter called, returned call to daughter and she said that patient was in agreement with Palliative services.  Daughter declined to schedule the Palliative Consult at this time.  Daughter said she was going to MD office to get paperwork completed regarding HIPPA so that she could receive information regarding her father.  Daughter said she would call me back to schedule visit

## 2019-10-01 ENCOUNTER — Telehealth: Payer: Self-pay | Admitting: Primary Care

## 2019-10-01 NOTE — Telephone Encounter (Signed)
Called patient's daughter, Crystal to f/u with her to see if we could schedule the Palliative Consult, no answer.  Left message letting her know that we need to either schedule the appointment or cancel the referral and notify the MD office, since it has been over a month since we rec'd the referral.  Left my name and contact number.

## 2019-10-06 ENCOUNTER — Telehealth: Payer: Self-pay | Admitting: Primary Care

## 2019-10-06 NOTE — Telephone Encounter (Signed)
Called daughter back to see if she was ready to schedule the Palliative consult for patient, no answer.  Left message requesting a call back by 'tthe end of the week and if I didn't hear back from her that I would need to cancel this referral and contact the MD office to let them know that I have been unable to schedule a visit, since we had rec'd this referral over a month ago.

## 2020-02-20 ENCOUNTER — Telehealth: Payer: Self-pay

## 2020-02-20 NOTE — Telephone Encounter (Signed)
Attempted to contact patient to schedule lung screening scan but unable to reach him. Detailed message left for pt to return call back to schedule CT scan. Phone number to return call was provided in message.

## 2020-03-14 ENCOUNTER — Telehealth: Payer: Self-pay | Admitting: *Deleted

## 2020-03-14 NOTE — Telephone Encounter (Signed)
Attempted to contact and schedule lung screening scan. Message left for patient to call back to schedule.

## 2020-03-31 ENCOUNTER — Other Ambulatory Visit: Payer: Self-pay

## 2020-03-31 ENCOUNTER — Inpatient Hospital Stay
Admission: EM | Admit: 2020-03-31 | Discharge: 2020-04-06 | DRG: 071 | Disposition: A | Discharge status: Skilled Nursing Facility | Payer: Medicare PPO | Attending: Internal Medicine | Admitting: Internal Medicine

## 2020-03-31 ENCOUNTER — Emergency Department: Payer: Medicare PPO

## 2020-03-31 DIAGNOSIS — F17210 Nicotine dependence, cigarettes, uncomplicated: Secondary | ICD-10-CM | POA: Diagnosis present

## 2020-03-31 DIAGNOSIS — I5032 Chronic diastolic (congestive) heart failure: Secondary | ICD-10-CM

## 2020-03-31 DIAGNOSIS — E7800 Pure hypercholesterolemia, unspecified: Secondary | ICD-10-CM | POA: Diagnosis present

## 2020-03-31 DIAGNOSIS — E785 Hyperlipidemia, unspecified: Secondary | ICD-10-CM | POA: Diagnosis present

## 2020-03-31 DIAGNOSIS — N1832 Chronic kidney disease, stage 3b: Secondary | ICD-10-CM | POA: Diagnosis present

## 2020-03-31 DIAGNOSIS — I2510 Atherosclerotic heart disease of native coronary artery without angina pectoris: Secondary | ICD-10-CM | POA: Diagnosis present

## 2020-03-31 DIAGNOSIS — N179 Acute kidney failure, unspecified: Secondary | ICD-10-CM | POA: Diagnosis not present

## 2020-03-31 DIAGNOSIS — Z8673 Personal history of transient ischemic attack (TIA), and cerebral infarction without residual deficits: Secondary | ICD-10-CM

## 2020-03-31 DIAGNOSIS — Z7902 Long term (current) use of antithrombotics/antiplatelets: Secondary | ICD-10-CM

## 2020-03-31 DIAGNOSIS — N184 Chronic kidney disease, stage 4 (severe): Secondary | ICD-10-CM

## 2020-03-31 DIAGNOSIS — E1165 Type 2 diabetes mellitus with hyperglycemia: Secondary | ICD-10-CM | POA: Diagnosis present

## 2020-03-31 DIAGNOSIS — I130 Hypertensive heart and chronic kidney disease with heart failure and stage 1 through stage 4 chronic kidney disease, or unspecified chronic kidney disease: Secondary | ICD-10-CM | POA: Diagnosis present

## 2020-03-31 DIAGNOSIS — E1122 Type 2 diabetes mellitus with diabetic chronic kidney disease: Secondary | ICD-10-CM | POA: Diagnosis present

## 2020-03-31 DIAGNOSIS — I152 Hypertension secondary to endocrine disorders: Secondary | ICD-10-CM | POA: Diagnosis present

## 2020-03-31 DIAGNOSIS — G9341 Metabolic encephalopathy: Secondary | ICD-10-CM

## 2020-03-31 DIAGNOSIS — Z86011 Personal history of benign neoplasm of the brain: Secondary | ICD-10-CM

## 2020-03-31 DIAGNOSIS — Z79899 Other long term (current) drug therapy: Secondary | ICD-10-CM

## 2020-03-31 DIAGNOSIS — Z7984 Long term (current) use of oral hypoglycemic drugs: Secondary | ICD-10-CM

## 2020-03-31 DIAGNOSIS — N189 Chronic kidney disease, unspecified: Secondary | ICD-10-CM

## 2020-03-31 DIAGNOSIS — R6510 Systemic inflammatory response syndrome (SIRS) of non-infectious origin without acute organ dysfunction: Secondary | ICD-10-CM | POA: Diagnosis not present

## 2020-03-31 DIAGNOSIS — E860 Dehydration: Secondary | ICD-10-CM | POA: Diagnosis present

## 2020-03-31 DIAGNOSIS — R270 Ataxia, unspecified: Secondary | ICD-10-CM | POA: Diagnosis present

## 2020-03-31 DIAGNOSIS — E1159 Type 2 diabetes mellitus with other circulatory complications: Secondary | ICD-10-CM | POA: Diagnosis present

## 2020-03-31 DIAGNOSIS — G9340 Encephalopathy, unspecified: Secondary | ICD-10-CM | POA: Diagnosis present

## 2020-03-31 DIAGNOSIS — F0390 Unspecified dementia without behavioral disturbance: Secondary | ICD-10-CM

## 2020-03-31 DIAGNOSIS — N1831 Chronic kidney disease, stage 3a: Secondary | ICD-10-CM

## 2020-03-31 DIAGNOSIS — N183 Chronic kidney disease, stage 3 unspecified: Secondary | ICD-10-CM | POA: Diagnosis present

## 2020-03-31 DIAGNOSIS — Z905 Acquired absence of kidney: Secondary | ICD-10-CM

## 2020-03-31 DIAGNOSIS — Z20822 Contact with and (suspected) exposure to covid-19: Secondary | ICD-10-CM | POA: Diagnosis present

## 2020-03-31 DIAGNOSIS — I10 Essential (primary) hypertension: Secondary | ICD-10-CM

## 2020-03-31 DIAGNOSIS — E876 Hypokalemia: Secondary | ICD-10-CM

## 2020-03-31 DIAGNOSIS — Z833 Family history of diabetes mellitus: Secondary | ICD-10-CM

## 2020-03-31 DIAGNOSIS — Z8249 Family history of ischemic heart disease and other diseases of the circulatory system: Secondary | ICD-10-CM

## 2020-03-31 DIAGNOSIS — Z0184 Encounter for antibody response examination: Secondary | ICD-10-CM

## 2020-03-31 HISTORY — DX: Acute kidney failure, unspecified: N17.9

## 2020-03-31 HISTORY — DX: Metabolic encephalopathy: G93.41

## 2020-03-31 HISTORY — DX: Acute kidney failure, unspecified: N18.9

## 2020-03-31 HISTORY — DX: Chronic diastolic (congestive) heart failure: I50.32

## 2020-03-31 HISTORY — DX: Systemic inflammatory response syndrome (sirs) of non-infectious origin without acute organ dysfunction: R65.10

## 2020-03-31 HISTORY — DX: Hyperlipidemia, unspecified: E78.5

## 2020-03-31 LAB — CBC WITH DIFFERENTIAL/PLATELET
Abs Immature Granulocytes: 0.02 10*3/uL (ref 0.00–0.07)
Basophils Absolute: 0 10*3/uL (ref 0.0–0.1)
Basophils Relative: 1 %
Eosinophils Absolute: 0 10*3/uL (ref 0.0–0.5)
Eosinophils Relative: 1 %
HCT: 36.6 % — ABNORMAL LOW (ref 39.0–52.0)
Hemoglobin: 12.5 g/dL — ABNORMAL LOW (ref 13.0–17.0)
Immature Granulocytes: 1 %
Lymphocytes Relative: 12 %
Lymphs Abs: 0.5 10*3/uL — ABNORMAL LOW (ref 0.7–4.0)
MCH: 32.5 pg (ref 26.0–34.0)
MCHC: 34.2 g/dL (ref 30.0–36.0)
MCV: 95.1 fL (ref 80.0–100.0)
Monocytes Absolute: 0.4 10*3/uL (ref 0.1–1.0)
Monocytes Relative: 10 %
Neutro Abs: 3.2 10*3/uL (ref 1.7–7.7)
Neutrophils Relative %: 75 %
Platelets: 176 10*3/uL (ref 150–400)
RBC: 3.85 MIL/uL — ABNORMAL LOW (ref 4.22–5.81)
RDW: 11.8 % (ref 11.5–15.5)
WBC: 4.1 10*3/uL (ref 4.0–10.5)
nRBC: 0 % (ref 0.0–0.2)

## 2020-03-31 LAB — URINALYSIS, COMPLETE (UACMP) WITH MICROSCOPIC
Bacteria, UA: NONE SEEN
Bilirubin Urine: NEGATIVE
Glucose, UA: >=500 mg/dL — AB
Ketones, ur: NEGATIVE mg/dL
Leukocytes,Ua: NEGATIVE
Nitrite: NEGATIVE
Protein, ur: >=300 mg/dL — AB
Specific Gravity, Urine: 1.013 (ref 1.005–1.030)
Squamous Epithelial / HPF: NONE SEEN (ref 0–5)
pH: 5 (ref 5.0–8.0)

## 2020-03-31 LAB — CBG MONITORING, ED
Glucose-Capillary: 173 mg/dL — ABNORMAL HIGH (ref 70–99)
Glucose-Capillary: 169 mg/dL — ABNORMAL HIGH (ref 70–99)

## 2020-03-31 LAB — TSH: TSH: 5.512 u[IU]/mL — ABNORMAL HIGH (ref 0.350–4.500)

## 2020-03-31 LAB — URINE DRUG SCREEN, QUALITATIVE (ARMC ONLY)
Amphetamines, Ur Screen: NOT DETECTED
Barbiturates, Ur Screen: NOT DETECTED
Benzodiazepine, Ur Scrn: NOT DETECTED
Cannabinoid 50 Ng, Ur ~~LOC~~: NOT DETECTED
Cocaine Metabolite,Ur ~~LOC~~: NOT DETECTED
MDMA (Ecstasy)Ur Screen: NOT DETECTED
Methadone Scn, Ur: NOT DETECTED
Opiate, Ur Screen: NOT DETECTED
Phencyclidine (PCP) Ur S: NOT DETECTED
Tricyclic, Ur Screen: NOT DETECTED

## 2020-03-31 LAB — COMPREHENSIVE METABOLIC PANEL
ALT: 15 U/L (ref 0–44)
AST: 17 U/L (ref 15–41)
Albumin: 3.3 g/dL — ABNORMAL LOW (ref 3.5–5.0)
Alkaline Phosphatase: 50 U/L (ref 38–126)
Anion gap: 21 — ABNORMAL HIGH (ref 5–15)
BUN: 16 mg/dL (ref 8–23)
CO2: 24 mmol/L (ref 22–32)
Calcium: 8.9 mg/dL (ref 8.9–10.3)
Chloride: 98 mmol/L (ref 98–111)
Creatinine, Ser: 1.67 mg/dL — ABNORMAL HIGH (ref 0.61–1.24)
GFR, Estimated: 44 mL/min — ABNORMAL LOW (ref >60)
Glucose, Bld: 210 mg/dL — ABNORMAL HIGH (ref 70–99)
Potassium: 3.5 mmol/L (ref 3.5–5.1)
Sodium: 143 mmol/L (ref 135–145)
Total Bilirubin: 1.3 mg/dL — ABNORMAL HIGH (ref 0.3–1.2)
Total Protein: 6.4 g/dL — ABNORMAL LOW (ref 6.5–8.1)

## 2020-03-31 LAB — RESP PANEL BY RT-PCR (FLU A&B, COVID) ARPGX2
Influenza A by PCR: NEGATIVE
Influenza B by PCR: NEGATIVE
SARS Coronavirus 2 by RT PCR: NEGATIVE

## 2020-03-31 LAB — AMMONIA: Ammonia: <9 umol/L — ABNORMAL LOW (ref 9–35)

## 2020-03-31 LAB — HIV ANTIBODY (ROUTINE TESTING W REFLEX): HIV Screen 4th Generation wRfx: NONREACTIVE

## 2020-03-31 LAB — LACTIC ACID, PLASMA: Lactic Acid, Venous: 1.6 mmol/L (ref 0.5–1.9)

## 2020-03-31 LAB — PROCALCITONIN: Procalcitonin: <0.1 ng/mL

## 2020-03-31 LAB — PROTIME-INR
INR: 1 (ref 0.8–1.2)
Prothrombin Time: 12.7 seconds (ref 11.4–15.2)

## 2020-03-31 LAB — BRAIN NATRIURETIC PEPTIDE: B Natriuretic Peptide: 65.9 pg/mL (ref 0.0–100.0)

## 2020-03-31 LAB — ETHANOL: Alcohol, Ethyl (B): <10 mg/dL (ref <10)

## 2020-03-31 LAB — GLUCOSE, CAPILLARY: Glucose-Capillary: 165 mg/dL — ABNORMAL HIGH (ref 70–99)

## 2020-03-31 LAB — MAGNESIUM: Magnesium: 1.7 mg/dL (ref 1.7–2.4)

## 2020-03-31 MED ORDER — HYDRALAZINE HCL 20 MG/ML IJ SOLN
5.0000 mg | INTRAMUSCULAR | Status: DC | PRN
  Administered 2020-03-31 – 2020-04-03 (×4): 5 mg via INTRAVENOUS
  Filled 2020-03-31 (×4): qty 1

## 2020-03-31 MED ORDER — INSULIN ASPART 100 UNIT/ML ~~LOC~~ SOLN
0.0000 [IU] | Freq: Three times a day (TID) | SUBCUTANEOUS | Status: DC
  Administered 2020-03-31 (×2): 2 [IU] via SUBCUTANEOUS
  Administered 2020-04-01: 18:00:00 3 [IU] via SUBCUTANEOUS
  Administered 2020-04-01: 09:00:00 2 [IU] via SUBCUTANEOUS
  Administered 2020-04-01: 13:00:00 3 [IU] via SUBCUTANEOUS
  Administered 2020-04-02: 16:00:00 2 [IU] via SUBCUTANEOUS
  Administered 2020-04-02: 13:00:00 5 [IU] via SUBCUTANEOUS
  Administered 2020-04-02 – 2020-04-03 (×3): 3 [IU] via SUBCUTANEOUS
  Administered 2020-04-03: 08:00:00 2 [IU] via SUBCUTANEOUS
  Administered 2020-04-04 (×2): 7 [IU] via SUBCUTANEOUS
  Administered 2020-04-04 – 2020-04-05 (×2): 1 [IU] via SUBCUTANEOUS
  Administered 2020-04-05 (×2): 3 [IU] via SUBCUTANEOUS
  Filled 2020-03-31 (×17): qty 1

## 2020-03-31 MED ORDER — INSULIN ASPART 100 UNIT/ML ~~LOC~~ SOLN
0.0000 [IU] | Freq: Every day | SUBCUTANEOUS | Status: DC
  Administered 2020-04-01: 21:00:00 2 [IU] via SUBCUTANEOUS
  Filled 2020-03-31: qty 1

## 2020-03-31 MED ORDER — CLOPIDOGREL BISULFATE 75 MG PO TABS
75.0000 mg | ORAL_TABLET | Freq: Every day | ORAL | Status: DC
  Administered 2020-04-01 – 2020-04-06 (×6): 75 mg via ORAL
  Filled 2020-03-31 (×6): qty 1

## 2020-03-31 MED ORDER — SERTRALINE HCL 50 MG PO TABS
25.0000 mg | ORAL_TABLET | Freq: Every day | ORAL | Status: DC
  Administered 2020-04-01 – 2020-04-06 (×6): 25 mg via ORAL
  Filled 2020-03-31 (×6): qty 1

## 2020-03-31 MED ORDER — SODIUM CHLORIDE 0.9 % IV SOLN
1.0000 g | Freq: Once | INTRAVENOUS | Status: AC
  Administered 2020-03-31: 14:00:00 1 g via INTRAVENOUS
  Filled 2020-03-31: qty 10

## 2020-03-31 MED ORDER — RISPERIDONE 1 MG PO TABS
1.0000 mg | ORAL_TABLET | Freq: Every day | ORAL | Status: DC
  Administered 2020-03-31 – 2020-04-05 (×6): 1 mg via ORAL
  Filled 2020-03-31 (×7): qty 1

## 2020-03-31 MED ORDER — ACETAMINOPHEN 325 MG PO TABS: 650.0000 mg | ORAL_TABLET | Freq: Once | ORAL | Status: DC

## 2020-03-31 MED ORDER — TAMSULOSIN HCL 0.4 MG PO CAPS
0.4000 mg | ORAL_CAPSULE | Freq: Every day | ORAL | Status: DC
  Administered 2020-04-01 – 2020-04-06 (×6): 0.4 mg via ORAL
  Filled 2020-03-31 (×6): qty 1

## 2020-03-31 MED ORDER — ENOXAPARIN SODIUM 60 MG/0.6ML ~~LOC~~ SOLN
47.5000 mg | SUBCUTANEOUS | Status: DC
  Administered 2020-03-31 – 2020-04-05 (×6): 47.5 mg via SUBCUTANEOUS
  Filled 2020-03-31 (×6): qty 0.6

## 2020-03-31 MED ORDER — SODIUM CHLORIDE 0.9 % IV SOLN
2.0000 g | Freq: Two times a day (BID) | INTRAVENOUS | Status: DC
  Administered 2020-04-01 – 2020-04-02 (×4): 2 g via INTRAVENOUS
  Filled 2020-03-31 (×5): qty 2
  Filled 2020-03-31: qty 0.4

## 2020-03-31 MED ORDER — ATORVASTATIN CALCIUM 20 MG PO TABS
80.0000 mg | ORAL_TABLET | Freq: Every day | ORAL | Status: DC
  Administered 2020-03-31 – 2020-04-05 (×6): 80 mg via ORAL
  Filled 2020-03-31 (×6): qty 4

## 2020-03-31 MED ORDER — SODIUM CHLORIDE 0.9 % IV BOLUS
500.0000 mL | Freq: Once | INTRAVENOUS | Status: AC
  Administered 2020-03-31: 10:00:00 500 mL via INTRAVENOUS

## 2020-03-31 MED ORDER — QUETIAPINE FUMARATE 25 MG PO TABS
50.0000 mg | ORAL_TABLET | Freq: Every day | ORAL | Status: DC
  Administered 2020-03-31 – 2020-04-05 (×6): 50 mg via ORAL
  Filled 2020-03-31 (×6): qty 2

## 2020-03-31 MED ORDER — DILTIAZEM HCL 60 MG PO TABS
60.0000 mg | ORAL_TABLET | Freq: Three times a day (TID) | ORAL | Status: DC
  Administered 2020-03-31 – 2020-04-03 (×8): 60 mg via ORAL
  Filled 2020-03-31: qty 1
  Filled 2020-03-31 (×2): qty 2
  Filled 2020-03-31: qty 1
  Filled 2020-03-31 (×2): qty 2
  Filled 2020-03-31 (×2): qty 1
  Filled 2020-03-31 (×2): qty 2
  Filled 2020-03-31 (×4): qty 1
  Filled 2020-03-31: qty 2
  Filled 2020-03-31 (×2): qty 1

## 2020-03-31 MED ORDER — NICOTINE 21 MG/24HR TD PT24
21.0000 mg | MEDICATED_PATCH | Freq: Every day | TRANSDERMAL | Status: DC
  Administered 2020-03-31 – 2020-04-06 (×7): 21 mg via TRANSDERMAL
  Filled 2020-03-31 (×7): qty 1

## 2020-03-31 MED ORDER — AMLODIPINE BESYLATE 5 MG PO TABS
10.0000 mg | ORAL_TABLET | Freq: Every day | ORAL | Status: DC
  Administered 2020-04-01 – 2020-04-06 (×6): 10 mg via ORAL
  Filled 2020-03-31 (×6): qty 2

## 2020-03-31 MED ORDER — ACETAMINOPHEN 325 MG PO TABS
650.0000 mg | ORAL_TABLET | Freq: Four times a day (QID) | ORAL | Status: DC | PRN
  Administered 2020-03-31 – 2020-04-01 (×2): 650 mg via ORAL
  Filled 2020-03-31 (×2): qty 2

## 2020-03-31 MED ORDER — VANCOMYCIN HCL 2000 MG/400ML IV SOLN
2000.0000 mg | Freq: Once | INTRAVENOUS | Status: AC
  Administered 2020-03-31: 23:00:00 2000 mg via INTRAVENOUS
  Filled 2020-03-31 (×2): qty 400

## 2020-03-31 MED ORDER — ONDANSETRON HCL 4 MG/2ML IJ SOLN: 4.0000 mg | Freq: Three times a day (TID) | INTRAMUSCULAR | Status: DC | PRN

## 2020-03-31 MED ORDER — SODIUM CHLORIDE 0.9 % IV SOLN: INTRAVENOUS | Status: DC

## 2020-03-31 MED ORDER — ACETAMINOPHEN 650 MG RE SUPP: 650.0000 mg | Freq: Four times a day (QID) | RECTAL | Status: DC | PRN

## 2020-03-31 MED ORDER — VANCOMYCIN HCL 750 MG/150ML IV SOLN
750.0000 mg | Freq: Two times a day (BID) | INTRAVENOUS | Status: DC
  Administered 2020-04-01 – 2020-04-02 (×3): 750 mg via INTRAVENOUS
  Filled 2020-03-31 (×4): qty 150

## 2020-03-31 MED ORDER — ACETAMINOPHEN 650 MG RE SUPP
650.0000 mg | Freq: Once | RECTAL | Status: AC
  Administered 2020-03-31: 11:00:00 650 mg via RECTAL
  Filled 2020-03-31: qty 1

## 2020-03-31 NOTE — ED Triage Notes (Signed)
pt arrives via ems from home. wife reported to ems that she found pt around 0800 on hands and knees in floor. ems reports random intermit weakness and ephasia. pt denies weakness at this time upon arrival. when speaking to MD during assessment speech slow like he is trying to find the word.   BP 220/110 CBG 220 HR 105  99%  T 98.6

## 2020-03-31 NOTE — ED Provider Notes (Signed)
[facility] Emergency Department Provider Note   ____________________________________________   Event Date/Time   First MD Initiated Contact with Patient 03/31/20 [phone]     (approximate)  I have reviewed the triage vital signs and the nursing notes.   HISTORY  Chief Complaint confusion  EM caveat: Confusion, poor historian  HPI Steven Ingram is a 69 y.o. male presents to the ER for evaluation for confusion  EMS reports that patient was found to be confused today.  He was last known to be normal yesterday in the evening when he went to bed.  EMS reports it seems like he is having intermittent confusion with them had a negative stroke screen  He evidently did receive his COVID-19 booster yesterday  Patient himself denies being in pain or discomfort.  Denies headache no chest pain no trouble breathing.  He does seem slightly confused however, and does not track conversation readily.   Past Medical History:  Diagnosis Date   Diabetes mellitus without complication ([place])    High cholesterol    Hypertension     Patient Active Problem List   Diagnosis Date Noted   Acute metabolic encephalopathy 65/99/3570   SIRS (systemic inflammatory response syndrome) ([place]) 03/31/2020   Acute renal failure superimposed on stage 3a chronic kidney disease ([place]) 03/31/2020   HLD (hyperlipidemia) 03/31/2020   Malnutrition of moderate degree 07/20/2019   Dizziness 07/19/2019   Unsteady gait when walking 07/19/2019   History of CVA (cerebrovascular accident) 07/19/2019   Type 2 diabetes mellitus with stage 3 chronic kidney disease ([place]) 07/19/2019   CAD (coronary artery disease) 07/19/2019   HTN (hypertension) 07/19/2019   H/O right nephrectomy 07/19/2019   Left pontine stroke ([place]) 07/19/2019   CVA (cerebral vascular accident) ([place]) 10/03/2018    Past Surgical History:  Procedure Laterality Date   TEE WITHOUT CARDIOVERSION N/A 10/07/2018    Procedure: TRANSESOPHAGEAL ECHOCARDIOGRAM (TEE);  Surgeon: Teodoro Spray, MD;  Location: [facility] ORS;  Service: Cardiovascular;  Laterality: N/A;    Prior to Admission medications   Medication Sig Start Date End Date Taking? Authorizing Provider  atorvastatin (LIPITOR) 40 MG tablet Take 40 mg by mouth daily.    [provider]  clopidogrel (PLAVIX) 75 MG tablet Take 1 tablet (75 mg total) by mouth daily. 07/21/19   Jennye Boroughs, MD  diltiazem (CARDIZEM) 60 MG tablet Take 1 tablet (60 mg total) by mouth 2 (two) times daily. 07/20/19   Jennye Boroughs, MD  glipiZIDE (GLUCOTROL XL) 10 MG 24 hr tablet Take 10 mg by mouth daily with breakfast.    [provider]  INVOKANA 100 MG TABS tablet Take 100 mg by mouth daily. 06/17/19   [provider]  lisinopril (ZESTRIL) 20 MG tablet Take 20 mg by mouth daily. 04/08/19   [provider]  metFORMIN (GLUCOPHAGE) 500 MG tablet Take 500 mg by mouth 2 (two) times daily with a meal.     [provider]  QUEtiapine (SEROQUEL) 25 MG tablet Take 1 tablet (25 mg total) by mouth daily. 07/20/19   Jennye Boroughs, MD  QUEtiapine (SEROQUEL) 50 MG tablet Take 50 mg by mouth at bedtime. 05/13/19   [provider]  senna (SENOKOT) 8.6 MG tablet Take 1 tablet by mouth daily.    [provider]  tamsulosin (FLOMAX) 0.4 MG CAPS capsule Take 0.4 mg by mouth 2 (two) times a day. 08/05/18   [provider]    Allergies Patient has no known allergies.  History  reviewed. No pertinent family history.  Social History Social History   Tobacco Use   Smoking status: Current Every Day Smoker    Packs/day: 1.00    Years: 53.00    Pack years: 53.00    Types: Cigarettes   Smokeless tobacco: Never Used  Scientific laboratory technician Use: Never used  Substance Use Topics   Alcohol use: Yes    Comment: last drink yesterday   Drug use: Never    Review of Systems  EM caveat felt unreliable historian and  confused  Patient does however report and answers no to having any headache neck pain chest pain trouble breathing abdominal pain or other concerns.  He reports he feels fine but tired   ____________________________________________   PHYSICAL EXAM:  VITAL SIGNS: ED Triage Vitals  Enc Vitals Group     BP      Pulse      Resp      Temp      Temp src      SpO2      Weight      Height      Head Circumference      Peak Flow      Pain Score      Pain Loc      Pain Edu?      Excl. in [place]?     Constitutional: Alert and oriented to self but not to date or year not oriented well to situation or events of today.  Mildly ill but in no distress.  Appears somewhat fatigued Eyes: Conjunctivae are normal. Head: Atraumatic. Nose: No congestion/rhinnorhea. Mouth/Throat: Mucous membranes are slightly dry. Neck: No stridor.  Cardiovascular: Minimally tachycardic rate, regular rhythm. Grossly normal heart sounds.  Good peripheral circulation. Respiratory: Normal respiratory effort.  No retractions. Lungs CTAB. Gastrointestinal: Soft and nontender. No distention. Musculoskeletal: No lower extremity tenderness nor edema. Neurologic:  Normal speech and language though at times he seems to have difficulty following complex commands and often seems to have slight difficulty recalling events especially of today. No gross focal neurologic deficits are appreciated.  He has no pronator drift.  He has equal facial smile.  Extraocular movements are normal.  He moves all extremities with normal strength.  Reports normal sensation in all extremities.  No ataxia in the upper lower extremities bilateral Skin:  Skin is warm, dry and intact. No rash noted. Psychiatric: Mood and affect are normal. Speech and behavior are normal.  ____________________________________________   LABS (all labs ordered are listed, but only abnormal results are displayed)  Labs Reviewed  COMPREHENSIVE METABOLIC PANEL - Abnormal;  Notable for the following components:      Result Value   Glucose, Bld 210 (*)    Creatinine, Ser 1.67 (*)    Total Protein 6.4 (*)    Albumin 3.3 (*)    Total Bilirubin 1.3 (*)    GFR, Estimated 44 (*)    Anion gap 21 (*)    All other components within normal limits  CBC WITH DIFFERENTIAL/PLATELET - Abnormal; Notable for the following components:   RBC 3.85 (*)    Hemoglobin 12.5 (*)    HCT 36.6 (*)    Lymphs Abs 0.5 (*)    All other components within normal limits  URINALYSIS, COMPLETE (UACMP) WITH MICROSCOPIC - Abnormal; Notable for the following components:   Color, Urine YELLOW (*)    APPearance CLEAR (*)    Glucose, UA >=500 (*)    Hgb urine dipstick SMALL (*)  Protein, ur >=300 (*)    All other components within normal limits  AMMONIA - Abnormal; Notable for the following components:   Ammonia <9 (*)    All other components within normal limits  TSH - Abnormal; Notable for the following components:   TSH 5.512 (*)    All other components within normal limits  RESP PANEL BY RT-PCR (FLU A&B, COVID) ARPGX2  CULTURE, BLOOD (ROUTINE X 2)  CULTURE, BLOOD (ROUTINE X 2)  URINE CULTURE  URINE DRUG SCREEN, QUALITATIVE ([facility] ONLY)  PROTIME-INR  LACTIC ACID, PLASMA  ETHANOL  BRAIN NATRIURETIC PEPTIDE  PROCALCITONIN  CBG MONITORING, ED   ____________________________________________  EKG  Reviewed interpreted 920 Heart rate 105 QRs 140 QTc 600 Prolonged QT, mild nonspecific abnormality.  No evidence of acute ischemia denoted ____________________________________________  RADIOLOGY  CT HEAD WO CONTRAST  Result Date: 03/31/2020 CLINICAL DATA:  [place] EXAM: CT HEAD WITHOUT CONTRAST TECHNIQUE: Contiguous axial images were obtained from the base of the skull through the vertex without intravenous contrast. COMPARISON:  07/19/2019 and prior. FINDINGS: Brain: No acute infarct or intracranial hemorrhage. No mass lesion. No midline shift, ventriculomegaly or extra-axial  fluid collection. Mild cerebral atrophy with ex vacuo dilatation. Chronic microvascular ischemic changes. Chronic bilateral thalamic, left corona radiata and left basal ganglia lacunar insults. Vascular: No hyperdense vessel or unexpected calcification. Bilateral skull base atherosclerotic calcifications. Skull: Negative for fracture or focal lesion. Sinuses/Orbits: Normal orbits. Clear paranasal sinuses. No mastoid effusion. Other: Chronic left posterior scalp soft tissue defect. IMPRESSION: No acute intracranial process.  Chronic multifocal lacunar insults. Mild cerebral atrophy and chronic microvascular ischemic changes. Electronically Signed   By: Primitivo Gauze M.D.   On: 03/31/2020 10:29   DG Chest Portable 1 View  Result Date: 03/31/2020 CLINICAL DATA:  Fever EXAM: PORTABLE CHEST 1 VIEW COMPARISON:  10/03/2018 FINDINGS: Cardiac shadow is stable. Lungs are well aerated bilaterally. No focal infiltrate or sizable effusion is seen. No bony abnormality is noted. IMPRESSION: No acute abnormality seen. Electronically Signed   By: [name] M.D.   On: 03/31/2020 10:39     Chest x-ray reviewed negative for acute.  CT head negative for acute, full finding as above ____________________________________________   PROCEDURES  Procedure(s) performed: None  Procedures  Critical Care performed: No  ____________________________________________   INITIAL IMPRESSION / ASSESSMENT AND PLAN / ED COURSE  Pertinent labs & imaging results that were available during my care of the patient were reviewed by me and considered in my medical decision making (see chart for details).   Patient noted to be febrile after temperature collected.  He did recently receive Covid immunization but he also has associated confusion along with fever.  Will work-up for possible infectious etiologies including both bacterial viral, no obvious risk factor for fungal.  I will also evaluate broadly for etiology for  confusion but based on his fever I suspect infectious source.  CT head pending.  No obvious focal neuro deficits.  He has no complaints of cardiac or vascular symptoms.  He does however have mild ongoing confusion.    Clinical Course as of 03/31/20 1232  Thu Mar 31, 2020  1049 Patient's wife now at bedside, updated on plan of care.  She does need to leave soon for her own dialysis appointment but reports she has noticed that his urine has been very foul in odor for the last day and concern for UTI [MQ]    Clinical Course User Index [MQ] Delman Kitten, MD   -----------------------------------------  11:11 AM on 03/31/2020 -----------------------------------------  Patient's urinalysis rather unrevealing with regard to source.  Chest x-ray clear.  No rashes no headaches, no obvious bacterial infection to noted at this time.  However based on the patient's fever confusion and report of significant dysuria or abnormal urinary odor per the wife who is now at her own dialysis treatment session, I will start patient on Rocephin as we await culture and urine culture.  Patient will be admitted to the hospitalist for further care and management.  Also in consideration of his receiving Covid vaccine yesterday, postvaccine fever is a consideration but I do not think should necessarily lead to this level of confusion either.  He may need further work-up and evaluation under the hospitalist service.  We will admit for further care management including rule out infectious etiology/sepsis and further work-up for encephalopathy to hospitalist service.  Admission discussed with Dr. Reola Calkins ____________________________________________   FINAL CLINICAL IMPRESSION(S) / ED DIAGNOSES  Final diagnoses:  Acute metabolic encephalopathy  SIRS (systemic inflammatory response syndrome) ([place])        Note:  This document was prepared using Dragon voice recognition software and may include unintentional dictation  errors       Delman Kitten, MD 03/31/20 1232

## 2020-03-31 NOTE — H&P (Addendum)
History and Physical    Steven Ingram TKZ:[id number] DOB: October 12, 1950 DOA: 03/31/2020  Referring MD/NP/PA:   PCP: [employer]   Patient coming from:  The patient is coming from home.  At baseline, pt is independent for most of ADL.        Chief Complaint: AMS  HPI: Steven Ingram is a 69 y.o. male with medical history significant of hypertension, hyperlipidemia, dCHF, diabetes mellitus, stroke, depression, CAD, s/p for right nephrectomy, CKD-3A, tobacco abuse, who presents with altered mental status.  Per his wife (I called his wife by phone), pt become confused this AM. He has has generalized weakness, could not get up normally. Wife states that pt wears denture which makes him difficult speaking normally.  Patient does not seem to have chest pain, cough, shortness of breath.  Patient had a diarrhea recently which has resolved per his wife, currently patient does not have nausea vomiting, diarrhea or abdominal pain.  Patient has bilateral lower leg edema. He received his 3rd dose of COVID-19 booster yesterday. He developed fever. His body temperature is 101.2 in ED.  ED Course: pt was found to have WBC 4.1, negative UDS, lactic acid 1.6, INR 1.0, BNP 65.9, negative urinalysis, negative Covid PCR, alcohol level less than 10, ammonia level less than 9, worsening renal function, temperature one 1.2, blood pressure 178/95, heart rate 106, RR 20, oxygen saturation 95% on room air.  Chest x-ray negative.  CT of head is negative for acute intracranial abnormalities, but showed chronic lacunar infarct.  Patient is placed on MedSurg bed for observation.   Review of Systems: Could not be reviewed accurately due to altered mental status.  Allergy: No Known Allergies  Past Medical History:  Diagnosis Date  . Diabetes mellitus without complication ([place])   . High cholesterol   . Hypertension     Past Surgical History:  Procedure Laterality Date  . TEE WITHOUT CARDIOVERSION N/A  10/07/2018   Procedure: TRANSESOPHAGEAL ECHOCARDIOGRAM (TEE);  Surgeon: Teodoro Spray, MD;  Location: [facility] ORS;  Service: Cardiovascular;  Laterality: N/A;    Social History:  reports that he has been smoking cigarettes. He has a 53.00 pack-year smoking history. He has never used smokeless tobacco. He reports current alcohol use. He reports that he does not use drugs.  Family History:  Family History  Problem Relation Age of Onset  . Diabetes Mellitus II Mother   . Hypertension Mother   . Diabetes Mellitus II Father   . Hypertension Father      Prior to Admission medications   Medication Sig Start Date End Date Taking? Authorizing Provider  atorvastatin (LIPITOR) 40 MG tablet Take 40 mg by mouth daily.    [provider]  clopidogrel (PLAVIX) 75 MG tablet Take 1 tablet (75 mg total) by mouth daily. 07/21/19   Jennye Boroughs, MD  diltiazem (CARDIZEM) 60 MG tablet Take 1 tablet (60 mg total) by mouth 2 (two) times daily. 07/20/19   Jennye Boroughs, MD  glipiZIDE (GLUCOTROL XL) 10 MG 24 hr tablet Take 10 mg by mouth daily with breakfast.    [provider]  INVOKANA 100 MG TABS tablet Take 100 mg by mouth daily. 06/17/19   [provider]  lisinopril (ZESTRIL) 20 MG tablet Take 20 mg by mouth daily. 04/08/19   [provider]  metFORMIN (GLUCOPHAGE) 500 MG tablet Take 500 mg by mouth 2 (two) times daily with a meal.     [provider]  QUEtiapine (SEROQUEL) 25  MG tablet Take 1 tablet (25 mg total) by mouth daily. 07/20/19   Jennye Boroughs, MD  QUEtiapine (SEROQUEL) 50 MG tablet Take 50 mg by mouth at bedtime. 05/13/19   [provider]  senna (SENOKOT) 8.6 MG tablet Take 1 tablet by mouth daily.    [provider]  tamsulosin (FLOMAX) 0.4 MG CAPS capsule Take 0.4 mg by mouth 2 (two) times a day. 08/05/18   [provider]    Physical Exam: Vitals:   03/31/20 1030 03/31/20 1300 03/31/20 1330 03/31/20 1500  BP: (!) 178/95 (!)  148/100 (!) 141/90 (!) 168/110  Pulse:  (!) 102 (!) 103 (!) 114  Resp: 13 (!) 21 17 (!) 23  Temp:      TempSrc:      SpO2:  99% 99% 98%  Weight:      Height:       General: Not in acute distress HEENT:       Eyes: PERRL, EOMI, no scleral icterus.       ENT: No discharge from the ears and nose, no pharynx injection, no tonsillar enlargement.        Neck: No JVD, no bruit, no mass felt. Heme: No neck lymph node enlargement. Cardiac: S1/S2, RRR, No murmurs, No gallops or rubs. Respiratory: No rales, wheezing, rhonchi or rubs. GI: Soft, nondistended, nontender, no organomegaly, BS present. GU: No hematuria Ext: 1+ pitting leg edema bilaterally. 1+DP/PT pulse bilaterally. Musculoskeletal: No joint deformities, No joint redness or warmth, no limitation of ROM in spin. Skin: No rashes.  Neuro: Confused, knows his own name, is not orientated to time and place, cranial nerves II-XII grossly intact, moves all extremities normally. Neck is supple.  Psych: Patient is not psychotic, no suicidal or hemocidal ideation.  Labs on Admission: I have personally reviewed following labs and imaging studies  CBC: Recent Labs  Lab 03/31/20 1007  WBC 4.1  NEUTROABS 3.2  HGB 12.5*  HCT 36.6*  MCV 95.1  PLT 944   Basic Metabolic Panel: Recent Labs  Lab 03/31/20 1007  NA 143  K 3.5  CL 98  CO2 24  GLUCOSE 210*  BUN 16  CREATININE 1.67*  CALCIUM 8.9   GFR: Estimated Creatinine Clearance: 46.6 mL/min (A) (by C-G formula based on SCr of 1.67 mg/dL (H)). Liver Function Tests: Recent Labs  Lab 03/31/20 1007  AST 17  ALT 15  ALKPHOS 50  BILITOT 1.3*  PROT 6.4*  ALBUMIN 3.3*   No results for input(s): LIPASE, AMYLASE in the last 168 hours. Recent Labs  Lab 03/31/20 1007  AMMONIA <9*   Coagulation Profile: Recent Labs  Lab 03/31/20 1007  INR 1.0   Cardiac Enzymes: No results for input(s): CKTOTAL, CKMB, CKMBINDEX, TROPONINI in the last 168 hours. BNP (last 3 results) No  results for input(s): PROBNP in the last 8760 hours. HbA1C: No results for input(s): HGBA1C in the last 72 hours. CBG: Recent Labs  Lab 03/31/20 1349  GLUCAP 173*   Lipid Profile: No results for input(s): CHOL, HDL, LDLCALC, TRIG, CHOLHDL, LDLDIRECT in the last 72 hours. Thyroid Function Tests: Recent Labs    03/31/20 1111  TSH 5.512*   Anemia Panel: No results for input(s): VITAMINB12, FOLATE, FERRITIN, TIBC, IRON, RETICCTPCT in the last 72 hours. Urine analysis:    Component Value Date/Time   COLORURINE YELLOW (A) 03/31/2020 1007   APPEARANCEUR CLEAR (A) 03/31/2020 1007   LABSPEC 1.013 03/31/2020 1007   PHURINE 5.0 03/31/2020 1007   GLUCOSEU >=500 (  A) 03/31/2020 1007   HGBUR SMALL (A) 03/31/2020 1007   BILIRUBINUR NEGATIVE 03/31/2020 1007   KETONESUR NEGATIVE 03/31/2020 1007   PROTEINUR >=300 (A) 03/31/2020 1007   NITRITE NEGATIVE 03/31/2020 1007   LEUKOCYTESUR NEGATIVE 03/31/2020 1007   Sepsis Labs: @LABRCNTIP (procalcitonin:4,lacticidven:4) ) Recent Results (from the past 240 hour(s))  Resp Panel by RT-PCR (Flu A&B, Covid) Nasopharyngeal Swab     Status: None   Collection Time: 03/31/20 10:07 AM   Specimen: Nasopharyngeal Swab; Nasopharyngeal(NP) swabs in vial transport medium  Result Value Ref Range Status   SARS Coronavirus 2 by RT PCR NEGATIVE NEGATIVE Final    Comment: (NOTE) SARS-CoV-2 target nucleic acids are NOT DETECTED.  The SARS-CoV-2 RNA is generally detectable in upper respiratory specimens during the acute phase of infection. The lowest concentration of SARS-CoV-2 viral copies this assay can detect is 138 copies/mL. A negative result does not preclude SARS-Cov-2 infection and should not be used as the sole basis for treatment or other patient management decisions. A negative result may occur with  improper specimen collection/handling, submission of specimen other than nasopharyngeal swab, presence of viral mutation(s) within the areas targeted by  this assay, and inadequate number of viral copies(<138 copies/mL). A negative result must be combined with clinical observations, patient history, and epidemiological information. The expected result is Negative.  Fact Sheet for Patients:  EntrepreneurPulse.com.au  Fact Sheet for Healthcare Providers:  IncredibleEmployment.be  This test is no t yet approved or cleared by the [place] FDA and  has been authorized for detection and/or diagnosis of SARS-CoV-2 by FDA under an Emergency Use Authorization (EUA). This EUA will remain  in effect (meaning this test can be used) for the duration of the COVID-19 declaration under Section 564(b)(1) of the Act, 21 U.S.C.section 360bbb-3(b)(1), unless the authorization is terminated  or revoked sooner.       Influenza A by PCR NEGATIVE NEGATIVE Final   Influenza B by PCR NEGATIVE NEGATIVE Final    Comment: (NOTE) The Xpert Xpress SARS-CoV-2/FLU/RSV plus assay is intended as an aid in the diagnosis of influenza from Nasopharyngeal swab specimens and should not be used as a sole basis for treatment. Nasal washings and aspirates are unacceptable for Xpert Xpress SARS-CoV-2/FLU/RSV testing.  Fact Sheet for Patients: EntrepreneurPulse.com.au  Fact Sheet for Healthcare Providers: IncredibleEmployment.be  This test is not yet approved or cleared by the [place] FDA and has been authorized for detection and/or diagnosis of SARS-CoV-2 by FDA under an Emergency Use Authorization (EUA). This EUA will remain in effect (meaning this test can be used) for the duration of the COVID-19 declaration under Section 564(b)(1) of the Act, 21 U.S.C. section 360bbb-3(b)(1), unless the authorization is terminated or revoked.  Performed at [facility], [address] [place], [place] [address]      Radiological Exams on Admission: CT HEAD WO CONTRAST  Result Date:  03/31/2020 CLINICAL DATA:  [place] EXAM: CT HEAD WITHOUT CONTRAST TECHNIQUE: Contiguous axial images were obtained from the base of the skull through the vertex without intravenous contrast. COMPARISON:  07/19/2019 and prior. FINDINGS: Brain: No acute infarct or intracranial hemorrhage. No mass lesion. No midline shift, ventriculomegaly or extra-axial fluid collection. Mild cerebral atrophy with ex vacuo dilatation. Chronic microvascular ischemic changes. Chronic bilateral thalamic, left corona radiata and left basal ganglia lacunar insults. Vascular: No hyperdense vessel or unexpected calcification. Bilateral skull base atherosclerotic calcifications. Skull: Negative for fracture or focal lesion. Sinuses/Orbits: Normal orbits. Clear paranasal sinuses. No mastoid effusion. Other: Chronic left  posterior scalp soft tissue defect. IMPRESSION: No acute intracranial process.  Chronic multifocal lacunar insults. Mild cerebral atrophy and chronic microvascular ischemic changes. Electronically Signed   By: Primitivo Gauze M.D.   On: 03/31/2020 10:29   DG Chest Portable 1 View  Result Date: 03/31/2020 CLINICAL DATA:  Fever EXAM: PORTABLE CHEST 1 VIEW COMPARISON:  10/03/2018 FINDINGS: Cardiac shadow is stable. Lungs are well aerated bilaterally. No focal infiltrate or sizable effusion is seen. No bony abnormality is noted. IMPRESSION: No acute abnormality seen. Electronically Signed   By: [name] M.D.   On: 03/31/2020 10:39     EKG: I have personally reviewed.  Sinus rhythm, occasional PVC, QTC 431, nonspecific T wave change  Assessment/Plan Principal Problem:   Acute metabolic encephalopathy Active Problems:   History of CVA (cerebrovascular accident)   Type 2 diabetes mellitus with stage 3 chronic kidney disease (HCC)   CAD (coronary artery disease)   HTN (hypertension)   SIRS (systemic inflammatory response syndrome) (HCC)   Acute renal failure superimposed on stage 3a chronic kidney disease  (HCC)   HLD (hyperlipidemia)   Chronic diastolic CHF (congestive heart failure) (HCC)   Acute metabolic encephalopathy: Etiology is not clear.  CT head is negative for acute intracranial abnormalities.  No focal neuro deficit on physical examination.  Neck supple.  Low suspicions for meningitis.  Likely multifactorial etiology, including fever, SIRS, worsening renal function.  No source of infection identified.  Patient has fever, which is likely due to COVID-19 vaccine booster shot.  -Placed on MedSurg bed for observation -Frequent neuro check  SIRS (systemic inflammatory response syndrome) [facility] Patient meets criteria for SIRS with tachycardia with heart rate of 106 and fever with body temperature 101.2.  Lactic acid is normal.  No source of infection identified.  Urinalysis negative with chest x-ray negative, therefore will treat patient as SIRS, not sepsis.  Lactic acid normal 1.6. -Patient received 1 dose of Rocephin in ED, will not continue since no source of infection identified -Follow-up of blood culture and urine culture -IV fluid: 75 cc/h  Addendum: pt continues to spike fever 101.3 -will start Vanco and cefepime   History of CVA (cerebrovascular accident) -Lipitor and plavix  Type 2 diabetes mellitus with stage 3 chronic kidney disease ([place]): Recent A1c 5.1, well controlled.  Patient is not taking medications currently.  Blood sugar 210 -SSI  CAD (coronary artery disease) -Continue Lipitor  HTN (hypertension): Blood pressure 178/95 -IV hydralazine as needed -Continue home amlodipine, Cardizem -Hold lisinopril due to worsening renal function  Acute renal failure superimposed on stage 3a chronic kidney disease ([place]): Baseline Cre is 1.32 on 07/19/19, pt's Cre is 1.67 and BUN 16 on admission. Likely due to dehydration and continuation of ACEI - IVF as above - Follow up renal function by BMP - Avoid using renal toxic medications, hypotension and contrast dye (or carefully  use) - Hold lisinopril  HLD (hyperlipidemia):  -Lipitor  Chronic diastolic CHF (congestive heart failure) ([place]): 2D echo on 07/20/2019 showed EF of 55 to 60% with grade 1 diastolic dysfunction.  Patient has had 1+ bilateral leg edema, but no respiratory distress. Chest x-ray did not show pulmonary edema.  BNP 65.9.  Does not seem to have CHF exacerbation. -Will not start diuretics due to SIRS -Watch volume status closely     DVT ppx: SQ Lovenox Code Status: Full code per her daughter Family Communication: Yes, patient's  Daughter and wife by phone Disposition Plan:  Anticipate discharge back to previous  environment Consults called:  none Admission status: Med-surg bed for obs    Status is: Observation  The patient remains OBS appropriate and will d/c before 2 midnights.  Dispo: The patient is from: Home              Anticipated d/c is to: Home                Anticipated d/c date is: 1 day              Patient currently is not medically stable to d/c.         Date of Service 03/31/2020    [facility]   If 7PM-7AM, please contact night-coverage [email or web address] 03/31/2020, 3:42 PM

## 2020-03-31 NOTE — Consult Note (Signed)
Pharmacy Antibiotic Note  Ellery Meroney is a 69 y.o. male admitted on 03/31/2020 with sepsis.  Pharmacy has been consulted for cefepime/vancomycin dosing. Unknown source. Tmax 101.3. Tachy HR 110-120. Received ceftriaxone x 1 in the ED. WBC 4.1   Plan: Cefepime 2 g q12H   Vancomycin 2000 mg x 1 followed by vancomycin 750 mg q12H. Goal trough 15-20. Plan to order vancomycin levels in 2-3 days.   Height: 5\' 9"  (175.3 cm) Weight: 94 kg (207 lb 3.7 oz) IBW/kg (Calculated) : 70.7  Temp (24hrs), Avg:101.3 F (38.5 C), Min:101.2 F (38.4 C), Max:101.3 F (38.5 C)  Recent Labs  Lab 03/31/20 1007  WBC 4.1  CREATININE 1.67*  LATICACIDVEN 1.6    Estimated Creatinine Clearance: 46.6 mL/min (A) (by C-G formula based on SCr of 1.67 mg/dL (H)).    No Known Allergies  Antimicrobials this admission: Ceftriaxone x 1   12/16 vancomycin  >>  12/16 cefepime >>   Dose adjustments this admission: None   Microbiology results: 12/16 BCx: pending 12/16 UCx: pending   Thank you for allowing pharmacy to be a part of this patient's care.  Oswald Hillock 03/31/2020 7:19 PM

## 2020-03-31 NOTE — ED Notes (Signed)
bg 173

## 2020-03-31 NOTE — Progress Notes (Signed)
PHARMACIST - PHYSICIAN COMMUNICATION  CONCERNING:  Enoxaparin (Lovenox) for DVT Prophylaxis    RECOMMENDATION: Patient was prescribed enoxaprin 40mg  q24 hours for VTE prophylaxis.   Filed Weights   03/31/20 0924  Weight: 94 kg (207 lb 3.7 oz)    Body mass index is 30.6 kg/m.  Estimated Creatinine Clearance: 46.6 mL/min (A) (by C-G formula based on SCr of 1.67 mg/dL (H)).   Based on [place] patient is candidate for enoxaparin 0.5mg /kg TBW SQ every 24 hours based on BMI being >30.   DESCRIPTION: Pharmacy has adjusted enoxaparin dose per [facility] policy.  Patient is now receiving enoxaparin 47.5 mg every 24 hours    Damein Gaunce, PharmD Clinical Pharmacist  03/31/2020 1:46 PM

## 2020-04-01 DIAGNOSIS — E785 Hyperlipidemia, unspecified: Secondary | ICD-10-CM | POA: Diagnosis present

## 2020-04-01 DIAGNOSIS — N179 Acute kidney failure, unspecified: Secondary | ICD-10-CM | POA: Diagnosis present

## 2020-04-01 DIAGNOSIS — E7800 Pure hypercholesterolemia, unspecified: Secondary | ICD-10-CM | POA: Diagnosis present

## 2020-04-01 DIAGNOSIS — Z905 Acquired absence of kidney: Secondary | ICD-10-CM | POA: Diagnosis not present

## 2020-04-01 DIAGNOSIS — Z79899 Other long term (current) drug therapy: Secondary | ICD-10-CM | POA: Diagnosis not present

## 2020-04-01 DIAGNOSIS — G9341 Metabolic encephalopathy: Secondary | ICD-10-CM | POA: Diagnosis present

## 2020-04-01 DIAGNOSIS — Z7984 Long term (current) use of oral hypoglycemic drugs: Secondary | ICD-10-CM | POA: Diagnosis not present

## 2020-04-01 DIAGNOSIS — R270 Ataxia, unspecified: Secondary | ICD-10-CM | POA: Diagnosis present

## 2020-04-01 DIAGNOSIS — Z86011 Personal history of benign neoplasm of the brain: Secondary | ICD-10-CM | POA: Diagnosis not present

## 2020-04-01 DIAGNOSIS — E1165 Type 2 diabetes mellitus with hyperglycemia: Secondary | ICD-10-CM | POA: Diagnosis present

## 2020-04-01 DIAGNOSIS — N1832 Chronic kidney disease, stage 3b: Secondary | ICD-10-CM | POA: Diagnosis present

## 2020-04-01 DIAGNOSIS — F0390 Unspecified dementia without behavioral disturbance: Secondary | ICD-10-CM | POA: Diagnosis not present

## 2020-04-01 DIAGNOSIS — E876 Hypokalemia: Secondary | ICD-10-CM | POA: Diagnosis not present

## 2020-04-01 DIAGNOSIS — I2510 Atherosclerotic heart disease of native coronary artery without angina pectoris: Secondary | ICD-10-CM | POA: Diagnosis present

## 2020-04-01 DIAGNOSIS — I130 Hypertensive heart and chronic kidney disease with heart failure and stage 1 through stage 4 chronic kidney disease, or unspecified chronic kidney disease: Secondary | ICD-10-CM | POA: Diagnosis present

## 2020-04-01 DIAGNOSIS — G9340 Encephalopathy, unspecified: Secondary | ICD-10-CM

## 2020-04-01 DIAGNOSIS — Z20822 Contact with and (suspected) exposure to covid-19: Secondary | ICD-10-CM | POA: Diagnosis present

## 2020-04-01 DIAGNOSIS — E860 Dehydration: Secondary | ICD-10-CM | POA: Diagnosis present

## 2020-04-01 DIAGNOSIS — Z7902 Long term (current) use of antithrombotics/antiplatelets: Secondary | ICD-10-CM | POA: Diagnosis not present

## 2020-04-01 DIAGNOSIS — Z8673 Personal history of transient ischemic attack (TIA), and cerebral infarction without residual deficits: Secondary | ICD-10-CM | POA: Diagnosis not present

## 2020-04-01 DIAGNOSIS — Z833 Family history of diabetes mellitus: Secondary | ICD-10-CM | POA: Diagnosis not present

## 2020-04-01 DIAGNOSIS — R6510 Systemic inflammatory response syndrome (SIRS) of non-infectious origin without acute organ dysfunction: Secondary | ICD-10-CM | POA: Diagnosis present

## 2020-04-01 DIAGNOSIS — F17210 Nicotine dependence, cigarettes, uncomplicated: Secondary | ICD-10-CM | POA: Diagnosis present

## 2020-04-01 DIAGNOSIS — Z8249 Family history of ischemic heart disease and other diseases of the circulatory system: Secondary | ICD-10-CM | POA: Diagnosis not present

## 2020-04-01 DIAGNOSIS — E1122 Type 2 diabetes mellitus with diabetic chronic kidney disease: Secondary | ICD-10-CM | POA: Diagnosis present

## 2020-04-01 DIAGNOSIS — I5032 Chronic diastolic (congestive) heart failure: Secondary | ICD-10-CM | POA: Diagnosis present

## 2020-04-01 HISTORY — DX: Encephalopathy, unspecified: G93.40

## 2020-04-01 LAB — CBC
HCT: 34.5 % — ABNORMAL LOW (ref 39.0–52.0)
Hemoglobin: 12.2 g/dL — ABNORMAL LOW (ref 13.0–17.0)
MCH: 32.9 pg (ref 26.0–34.0)
MCHC: 35.4 g/dL (ref 30.0–36.0)
MCV: 93 fL (ref 80.0–100.0)
Platelets: 159 10*3/uL (ref 150–400)
RBC: 3.71 MIL/uL — ABNORMAL LOW (ref 4.22–5.81)
RDW: 11.8 % (ref 11.5–15.5)
WBC: 3.3 10*3/uL — ABNORMAL LOW (ref 4.0–10.5)
nRBC: 0 % (ref 0.0–0.2)

## 2020-04-01 LAB — BASIC METABOLIC PANEL
Anion gap: 10 (ref 5–15)
BUN: 15 mg/dL (ref 8–23)
CO2: 21 mmol/L — ABNORMAL LOW (ref 22–32)
Calcium: 8.2 mg/dL — ABNORMAL LOW (ref 8.9–10.3)
Chloride: 106 mmol/L (ref 98–111)
Creatinine, Ser: 1.61 mg/dL — ABNORMAL HIGH (ref 0.61–1.24)
GFR, Estimated: 46 mL/min — ABNORMAL LOW (ref >60)
Glucose, Bld: 185 mg/dL — ABNORMAL HIGH (ref 70–99)
Potassium: 3.6 mmol/L (ref 3.5–5.1)
Sodium: 137 mmol/L (ref 135–145)

## 2020-04-01 LAB — GLUCOSE, CAPILLARY
Glucose-Capillary: 175 mg/dL — ABNORMAL HIGH (ref 70–99)
Glucose-Capillary: 209 mg/dL — ABNORMAL HIGH (ref 70–99)
Glucose-Capillary: 246 mg/dL — ABNORMAL HIGH (ref 70–99)
Glucose-Capillary: 243 mg/dL — ABNORMAL HIGH (ref 70–99)

## 2020-04-01 LAB — URINE CULTURE: Culture: NO GROWTH

## 2020-04-01 LAB — T4, FREE: Free T4: 0.78 ng/dL (ref 0.61–1.12)

## 2020-04-01 MED ORDER — MAGNESIUM SULFATE 2 GM/50ML IV SOLN
2.0000 g | Freq: Once | INTRAVENOUS | Status: AC
  Administered 2020-04-01: 09:00:00 2 g via INTRAVENOUS
  Filled 2020-04-01: qty 50

## 2020-04-01 MED ORDER — POTASSIUM CHLORIDE CRYS ER 20 MEQ PO TBCR
40.0000 meq | EXTENDED_RELEASE_TABLET | Freq: Once | ORAL | Status: AC
  Administered 2020-04-01: 09:00:00 40 meq via ORAL
  Filled 2020-04-01: qty 2

## 2020-04-01 NOTE — Evaluation (Signed)
Clinical/Bedside Swallow Evaluation Patient Details  Name: Steven Ingram MRN: [id number] Date of Birth: [birth year]  Today's Date: 04/01/2020 Time: SLP Start Time (ACUTE ONLY): 0915 SLP Stop Time (ACUTE ONLY): 0933 SLP Time Calculation (min) (ACUTE ONLY): 18 min  Past Medical History:  Past Medical History:  Diagnosis Date  . Diabetes mellitus without complication ([place])   . High cholesterol   . Hypertension    Past Surgical History:  Past Surgical History:  Procedure Laterality Date  . TEE WITHOUT CARDIOVERSION N/A 10/07/2018   Procedure: TRANSESOPHAGEAL ECHOCARDIOGRAM (TEE);  Surgeon: Teodoro Spray, MD;  Location: [facility] ORS;  Service: Cardiovascular;  Laterality: N/A;   HPI:  Steven Ingram is a 69 y.o. male with medical history significant of hypertension, hyperlipidemia, dCHF, diabetes mellitus, stroke, depression, CAD, s/p for right nephrectomy, CKD-3A, tobacco abuse, who presents with altered mental status.  Patient has bilateral lower leg edema. He received his 3rd dose of COVID-19 booster yesterday. He developed fever. His body temperature is 101.2 in ED. pt was found to have WBC 4.1, negative UDS, lactic acid 1.6, INR 1.0, BNP 65.9, negative urinalysis, negative Covid PCR, alcohol level less than 10, ammonia level less than 9, worsening renal function, temperature one 1.2, blood pressure 178/95, heart rate 106, RR 20, oxygen saturation 95% on room air.  Chest x-ray negative.  CT of head is negative for acute intracranial abnormalities, but showed chronic lacunar infarct.   Assessment / Plan / Recommendation Clinical Impression  Pt demonstrates grossly adequate oropharyngeal abilities when consuming current diet of dysphagia 3 with thin liquids via straw and cup. Pt's oral phase was a little longer than anticipated but completely function and adequate for pt. Pt's pharyngeal phase appeared swift with no overt s/s of aspiration. Pt had the most difficulty using his left hand to load  his fork and spoon. For that reason, recommend help with self-feeding and continue dysphagia 3 d/t inability to cut meat. ST intervention is not indicated at this time. SLP Visit Diagnosis: Dysphagia, unspecified (R13.10)    Aspiration Risk  Mild aspiration risk    Diet Recommendation Dysphagia 3 (Mech soft);Thin liquid   Liquid Administration via: Straw;Cup Medication Administration: Whole meds with liquid Supervision: Staff to assist with self feeding Compensations: Minimize environmental distractions;Slow rate;Small sips/bites Postural Changes: Seated upright at 90 degrees    Other  Recommendations Oral Care Recommendations: Oral care BID   Follow up Recommendations None      Frequency and Duration            Prognosis        Swallow Study   General Date of Onset: 03/31/20 HPI: Steven Ingram is a 69 y.o. male with medical history significant of hypertension, hyperlipidemia, dCHF, diabetes mellitus, stroke, depression, CAD, s/p for right nephrectomy, CKD-3A, tobacco abuse, who presents with altered mental status.  Patient has bilateral lower leg edema. He received his 3rd dose of COVID-19 booster yesterday. He developed fever. His body temperature is 101.2 in ED. pt was found to have WBC 4.1, negative UDS, lactic acid 1.6, INR 1.0, BNP 65.9, negative urinalysis, negative Covid PCR, alcohol level less than 10, ammonia level less than 9, worsening renal function, temperature one 1.2, blood pressure 178/95, heart rate 106, RR 20, oxygen saturation 95% on room air.  Chest x-ray negative.  CT of head is negative for acute intracranial abnormalities, but showed chronic lacunar infarct. Type of Study: Bedside Swallow Evaluation Previous Swallow Assessment: none in chart Diet Prior to this Study: Dysphagia 3 (  soft);Thin liquids Temperature Spikes Noted: No Respiratory Status: Room air History of Recent Intubation: No Behavior/Cognition: Alert;Cooperative;Pleasant mood Oral Cavity  Assessment: Within Functional Limits Oral Care Completed by SLP: No Oral Cavity - Dentition: Adequate natural dentition Vision: Functional for self-feeding Self-Feeding Abilities: Needs set up;Needs assist Patient Positioning: Upright in bed Baseline Vocal Quality: Normal Volitional Cough: Strong Volitional Swallow: Able to elicit    Oral/Motor/Sensory Function Overall Oral Motor/Sensory Function: Within functional limits   Ice Chips Ice chips: Not tested   Thin Liquid Thin Liquid: Within functional limits Presentation: Straw;Cup;Self Fed    Nectar Thick Nectar Thick Liquid: Not tested   Honey Thick Honey Thick Liquid: Not tested   Puree Puree: Within functional limits Presentation: Self Fed;Spoon   Solid    [place] Mefferd B. Rutherford Nail M.S., CCC-SLP, CBIS Speech-Language Pathologist Rehabilitation Services Office [phone]  Solid: Within functional limits Presentation: Self Fed;Spoon Other Comments: dysphagia 3 solids      Nakari Bracknell 04/01/2020,2:02 PM

## 2020-04-01 NOTE — Progress Notes (Signed)
PROGRESS NOTE    Steven Ingram  KXF:[id number] DOB: 02/13/1951 DOA: 03/31/2020 PCP: [facility] [employer]    Brief Narrative:  69 y.o. male with medical history significant of hypertension, hyperlipidemia, dCHF, diabetes mellitus, stroke, depression, CAD, s/p for right nephrectomy, CKD-3A, tobacco abuse, who presents with altered mental status.  Per his wife (I called his wife by phone), pt become confused this AM. He has has generalized weakness, could not get up normally. Wife states that pt wears denture which makes him difficult speaking normally.  Patient does not seem to have chest pain, cough, shortness of breath.  Patient had a diarrhea recently which has resolved per his wife, currently patient does not have nausea vomiting, diarrhea or abdominal pain.  Patient has bilateral lower leg edema. He received his 3rd dose of COVID-19 booster yesterday. He developed fever. His body temperature is 101.2 in ED  12/17: T-max over interval one1.3.  Started on vancomycin and cefepime.  Stable this morning.  Alert, oriented x2.   Assessment & Plan:   Principal Problem:   Acute metabolic encephalopathy Active Problems:   History of CVA (cerebrovascular accident)   Type 2 diabetes mellitus with stage 3 chronic kidney disease (HCC)   CAD (coronary artery disease)   HTN (hypertension)   SIRS (systemic inflammatory response syndrome) (HCC)   Acute renal failure superimposed on stage 3a chronic kidney disease (HCC)   HLD (hyperlipidemia)   Chronic diastolic CHF (congestive heart failure) ([place])  Acute metabolic encephalopathy:  Etiology is not clear.   CT head is negative for acute intracranial abnormalities.   No focal neuro deficit on physical examination.   Low suspicions for meningitis.   Likely multifactorial etiology, including fever, SIRS, worsening renal function.   No source of infection identified.   Patient has fever, which is likely due to COVID-19 vaccine booster  shot. Plan: Frequent neuro checks Delirium precautions Frequent reorientation PT and OT evaluations Continue antibiotics as below  SIRS (systemic inflammatory response syndrome)  Patient meets criteria for SIRS with tachycardia with heart rate of 106 and fever with body temperature 101.2.  Lactic acid is normal.   No source of infection identified.   Urinalysis negative chest x-ray negative Lactic acid normal 1.6. Patient received 1 dose of Rocephin in ED Antibiotic coverage expanded to vancomycin and cefepime after fever 101.3 Suspect fever and SIRS due to vaccine Plan: Continue IV fluids for now Continue empiric broad-spectrum antibiotic coverage for now.  Can likely de-escalate/discontinue within 24 hours Follow blood and urine cultures Follow-up of blood culture and urine culture  History of CVA (cerebrovascular accident) -Lipitor and plavix  Type 2 diabetes mellitus with stage 3 chronic kidney disease (HCC)  Recent A1c 5.1, well controlled.   Patient is not taking medications currently.   -SSI  CAD (coronary artery disease) -Continue Lipitor  HTN (hypertension):  Blood pressure 178/95 on admission -IV hydralazine as needed -Continue home amlodipine, Cardizem -Hold lisinopril due to worsening renal function  Acute renal failure superimposed on stage 3a chronic kidney disease ([place]):  Baseline Cre is 1.32 on 07/19/19, pt's Cre is 1.67 and BUN 16 on admission.  Likely due to dehydration and continuation of ACEI Improving over interval Plan: - IVF as above -Daily BMP -Avoid nephrotoxins  HLD (hyperlipidemia):  -Lipitor  Chronic diastolic CHF (congestive heart failure) ([place])  2D echo on 07/20/2019 showed EF of 55 to 60% with grade 1 diastolic dysfunction.  Patient has had 1+ bilateral leg edema, but no respiratory distress.  Chest x-ray did not show pulmonary edema.  BNP 65.9.   Does not seem to have CHF exacerbation. -Will not start diuretics due to  SIRS -Watch volume status closely   DVT prophylaxis: Lovenox Code Status: Full Family Communication: Daughter [name] [phone] on 04/01/2020 Disposition Plan:Status is: Observation  The patient will require care spanning > 2 midnights and should be moved to inpatient because: Inpatient level of care appropriate due to severity of illness  Dispo: The patient is from: Home              Anticipated d/c is to: Home              Anticipated d/c date is: 1 day              Patient currently is not medically stable to d/c.   Remains on broad-spectrum antibiotics for SIRS.  No clear source of infection identified.  Patient remains fever free for the next 24 hours can discontinue antibiotics and plan on discharge home 04/02/2020.  Therapy evaluations requested and pending.    Consultants:   None  Procedures:   None  Antimicrobials:   Vancomycin  Cefepime   Subjective: Patient seen and examined.  No pain complaints.  Sitting up in bed eating breakfast.  Alert, oriented times person place.  Objective: Vitals:   03/31/20 2040 04/01/20 0008 04/01/20 0332 04/01/20 0802  BP: 137/84 (!) 148/91 (!) 153/92 (!) 165/91  Pulse: (!) 110 (!) 109 (!) 108 99  Resp: 20 20 20 20   Temp: 99.5 F (37.5 C) 98.7 F (37.1 C) 99.6 F (37.6 C) (!) 100.5 F (38.1 C)  TempSrc: Oral Oral Oral Oral  SpO2: 100% 100% 100% 100%  Weight:      Height:        Intake/Output Summary (Last 24 hours) at 04/01/2020 1009 Last data filed at 04/01/2020 1007 Gross per 24 hour  Intake 840 ml  Output 300 ml  Net 540 ml   Filed Weights   03/31/20 0924  Weight: 94 kg    Examination:  General exam: No acute distress Respiratory system: Mild bilateral scattered crackles.  Normal work of breathing.  Room air Cardiovascular system: S1 & S2 heard, RRR. No JVD, murmurs, rubs, gallops or clicks. No pedal edema. Gastrointestinal system: Abdomen is nondistended, soft and nontender. No organomegaly or  masses felt. Normal bowel sounds heard. Central nervous system: Alert, oriented x2, no focal deficits Extremities: Symmetric 5 x 5 power. Skin: No rashes, lesions or ulcers Psychiatry: Judgement and insight appear impaired. Mood & affect appropriate.     Data Reviewed: I have personally reviewed following labs and imaging studies  CBC: Recent Labs  Lab 03/31/20 1007 04/01/20 0520  WBC 4.1 3.3*  NEUTROABS 3.2  --   HGB 12.5* 12.2*  HCT 36.6* 34.5*  MCV 95.1 93.0  PLT 176 329   Basic Metabolic Panel: Recent Labs  Lab 03/31/20 1007 03/31/20 2039 04/01/20 0520  NA 143  --  137  K 3.5  --  3.6  CL 98  --  106  CO2 24  --  21*  GLUCOSE 210*  --  185*  BUN 16  --  15  CREATININE 1.67*  --  1.61*  CALCIUM 8.9  --  8.2*  MG  --  1.7  --    GFR: Estimated Creatinine Clearance: 48.3 mL/min (A) (by C-G formula based on SCr of 1.61 mg/dL (H)). Liver Function Tests: Recent Labs  Lab 03/31/20 1007  AST 17  ALT 15  ALKPHOS 50  BILITOT 1.3*  PROT 6.4*  ALBUMIN 3.3*   No results for input(s): LIPASE, AMYLASE in the last 168 hours. Recent Labs  Lab 03/31/20 1007  AMMONIA <9*   Coagulation Profile: Recent Labs  Lab 03/31/20 1007  INR 1.0   Cardiac Enzymes: No results for input(s): CKTOTAL, CKMB, CKMBINDEX, TROPONINI in the last 168 hours. BNP (last 3 results) No results for input(s): PROBNP in the last 8760 hours. HbA1C: No results for input(s): HGBA1C in the last 72 hours. CBG: Recent Labs  Lab 03/31/20 1349 03/31/20 1756 03/31/20 2232 04/01/20 0801  GLUCAP 173* 169* 165* 175*   Lipid Profile: No results for input(s): CHOL, HDL, LDLCALC, TRIG, CHOLHDL, LDLDIRECT in the last 72 hours. Thyroid Function Tests: Recent Labs    03/31/20 1111 04/01/20 0520  TSH 5.512*  --   FREET4  --  0.78   Anemia Panel: No results for input(s): VITAMINB12, FOLATE, FERRITIN, TIBC, IRON, RETICCTPCT in the last 72 hours. Sepsis Labs: Recent Labs  Lab 03/31/20 1007  03/31/20 1340  PROCALCITON  --  <0.10  LATICACIDVEN 1.6  --     Recent Results (from the past 240 hour(s))  Resp Panel by RT-PCR (Flu A&B, Covid) Nasopharyngeal Swab     Status: None   Collection Time: 03/31/20 10:07 AM   Specimen: Nasopharyngeal Swab; Nasopharyngeal(NP) swabs in vial transport medium  Result Value Ref Range Status   SARS Coronavirus 2 by RT PCR NEGATIVE NEGATIVE Final    Comment: (NOTE) SARS-CoV-2 target nucleic acids are NOT DETECTED.  The SARS-CoV-2 RNA is generally detectable in upper respiratory specimens during the acute phase of infection. The lowest concentration of SARS-CoV-2 viral copies this assay can detect is 138 copies/mL. A negative result does not preclude SARS-Cov-2 infection and should not be used as the sole basis for treatment or other patient management decisions. A negative result may occur with  improper specimen collection/handling, submission of specimen other than nasopharyngeal swab, presence of viral mutation(s) within the areas targeted by this assay, and inadequate number of viral copies(<138 copies/mL). A negative result must be combined with clinical observations, patient history, and epidemiological information. The expected result is Negative.  Fact Sheet for Patients:  EntrepreneurPulse.com.au  Fact Sheet for Healthcare Providers:  IncredibleEmployment.be  This test is no t yet approved or cleared by the [place] FDA and  has been authorized for detection and/or diagnosis of SARS-CoV-2 by FDA under an Emergency Use Authorization (EUA). This EUA will remain  in effect (meaning this test can be used) for the duration of the COVID-19 declaration under Section 564(b)(1) of the Act, 21 U.S.C.section 360bbb-3(b)(1), unless the authorization is terminated  or revoked sooner.       Influenza A by PCR NEGATIVE NEGATIVE Final   Influenza B by PCR NEGATIVE NEGATIVE Final    Comment:  (NOTE) The Xpert Xpress SARS-CoV-2/FLU/RSV plus assay is intended as an aid in the diagnosis of influenza from Nasopharyngeal swab specimens and should not be used as a sole basis for treatment. Nasal washings and aspirates are unacceptable for Xpert Xpress SARS-CoV-2/FLU/RSV testing.  Fact Sheet for Patients: EntrepreneurPulse.com.au  Fact Sheet for Healthcare Providers: IncredibleEmployment.be  This test is not yet approved or cleared by the [place] FDA and has been authorized for detection and/or diagnosis of SARS-CoV-2 by FDA under an Emergency Use Authorization (EUA). This EUA will remain in effect (meaning this test can be used) for the duration of the COVID-19  declaration under Section 564(b)(1) of the Act, 21 U.S.C. section 360bbb-3(b)(1), unless the authorization is terminated or revoked.  Performed at [facility], [address], [place], [place] [address]          Radiology Studies: CT HEAD WO CONTRAST  Result Date: 03/31/2020 CLINICAL DATA:  Delirium EXAM: CT HEAD WITHOUT CONTRAST TECHNIQUE: Contiguous axial images were obtained from the base of the skull through the vertex without intravenous contrast. COMPARISON:  07/19/2019 and prior. FINDINGS: Brain: No acute infarct or intracranial hemorrhage. No mass lesion. No midline shift, ventriculomegaly or extra-axial fluid collection. Mild cerebral atrophy with ex vacuo dilatation. Chronic microvascular ischemic changes. Chronic bilateral thalamic, left corona radiata and left basal ganglia lacunar insults. Vascular: No hyperdense vessel or unexpected calcification. Bilateral skull base atherosclerotic calcifications. Skull: Negative for fracture or focal lesion. Sinuses/Orbits: Normal orbits. Clear paranasal sinuses. No mastoid effusion. Other: Chronic left posterior scalp soft tissue defect. IMPRESSION: No acute intracranial process.  Chronic multifocal lacunar insults.  Mild cerebral atrophy and chronic microvascular ischemic changes. Electronically Signed   By: Primitivo Gauze M.D.   On: 03/31/2020 10:29   DG Chest Portable 1 View  Result Date: 03/31/2020 CLINICAL DATA:  Fever EXAM: PORTABLE CHEST 1 VIEW COMPARISON:  10/03/2018 FINDINGS: Cardiac shadow is stable. Lungs are well aerated bilaterally. No focal infiltrate or sizable effusion is seen. No bony abnormality is noted. IMPRESSION: No acute abnormality seen. Electronically Signed   By: [name] M.D.   On: 03/31/2020 10:39        Scheduled Meds: . amLODipine  10 mg Oral Daily  . atorvastatin  80 mg Oral Daily  . clopidogrel  75 mg Oral Daily  . diltiazem  60 mg Oral TID  . enoxaparin (LOVENOX) injection  47.5 mg Subcutaneous Q24H  . insulin aspart  0-5 Units Subcutaneous QHS  . insulin aspart  0-9 Units Subcutaneous TID WC  . nicotine  21 mg Transdermal Daily  . QUEtiapine  50 mg Oral QHS  . risperiDONE  1 mg Oral QHS  . sertraline  25 mg Oral Daily  . tamsulosin  0.4 mg Oral Daily   Continuous Infusions: . sodium chloride 75 mL/hr at 04/01/20 0546  . ceFEPime (MAXIPIME) IV 2 g (04/01/20 0920)  . magnesium sulfate bolus IVPB 2 g (04/01/20 0922)  . vancomycin       LOS: 0 days    Time spent: 25 minutes    Sidney Ace, MD [facility] Pager [phone]  If 7PM-7AM, please contact night-coverage 04/01/2020, 10:09 AM

## 2020-04-01 NOTE — Consult Note (Signed)
Pharmacy Antibiotic Note  Steven Ingram is a 69 y.o. male admitted on 03/31/2020 with sepsis.  Pharmacy has been consulted for cefepime/vancomycin dosing. Unknown source. Tmax 101.3. Tachy HR 110-120. Received ceftriaxone x 1 in the ED. WBC 4.1   Plan: Continue Cefepime 2 g q12H   Vancomycin 2000 mg x 1 in ED;  Will continue vancomycin 750 mg q12H.   Goal trough 15-20. Plan to order vancomycin levels in 1-2 days.   Height: 5\' 9"  (175.3 cm) Weight: 94 kg (207 lb 3.7 oz) IBW/kg (Calculated) : 70.7  Temp (24hrs), Avg:100.1 F (37.8 C), Min:98.7 F (37.1 C), Max:101.3 F (38.5 C)  Recent Labs  Lab 03/31/20 1007 04/01/20 0520  WBC 4.1 3.3*  CREATININE 1.67* 1.61*  LATICACIDVEN 1.6  --     Estimated Creatinine Clearance: 48.3 mL/min (A) (by C-G formula based on SCr of 1.61 mg/dL (H)).    No Known Allergies  Antimicrobials this admission: Ceftriaxone x 1   12/16 vancomycin  >>  12/16 cefepime >>   Dose adjustments this admission: None   Microbiology results: 12/16 BCx: pending 12/16 UCx: pending   COVID/FLU NEG  Thank you for allowing pharmacy to be a part of this patient's care.  Lu Duffel, PharmD, BCPS Clinical Pharmacist 04/01/2020 8:03 AM

## 2020-04-01 NOTE — Evaluation (Signed)
Occupational Therapy Evaluation Patient Details Name: Steven Ingram MRN: [id number] DOB: 24-May-1950 Today's Date: 04/01/2020    History of Present Illness 69 y.o. male with medical history significant of hypertension, hyperlipidemia, dCHF, diabetes mellitus, stroke, depression, CAD, s/p for right nephrectomy, CKD-3A, tobacco abuse, who presents with altered mental status.   Clinical Impression   Patient presenting with decreased I in self care, balance, functional mobility/transfers, endurance, safety awareness, and cognition.  Patient reports being independent without use of AD PTA. Pt's wife present in the room to confirm that pt does not use AD for mobility but does requiring supervision and increased time for tasks. When she has dialysis pt's brother comes over to stay with him for safety. She does endorse pt being confused at this time. Patient currently functioning with min - max A overall. Pt initially with posterior bias in sitting requiring mod A and progressing to min guard with min - mod tactile and verbal cuing. Pt standing with mod A and needing mod A for balance with posterior bias noted. Pt given RW and then able to side step and step to sink with min - mod A for balance. Pt continues to need max cuing for upright posture. OT discussed recommendation for short term rehab stay in order to gain strength and independence before returning home and caregiver verbalized agreement.  Patient will benefit from acute OT to increase overall independence in the areas of ADLs, functional mobility, and safety awareness in order to safely discharge to next venue of care.    Follow Up Recommendations  SNF;Supervision/Assistance - 24 hour    Equipment Recommendations  Other (comment) (defer to next venue of care)       Precautions / Restrictions Precautions Precautions: Fall      Mobility Bed Mobility Overal bed mobility: Needs Assistance Bed Mobility: Supine to Sit;Sit to Supine      Supine to sit: Mod assist Sit to supine: Max assist   General bed mobility comments: Pt needing assistance for B LEs supine <>sit and assist with trunk as well when returning to bed for safety    Transfers Overall transfer level: Needs assistance Equipment used: Rolling walker (2 wheeled) Transfers: Sit to/from [facility] Sit to Stand: Mod assist Stand pivot transfers: Mod assist       General transfer comment: cuing for hand placement and technique    Balance Overall balance assessment: Needs assistance Sitting-balance support: Feet supported Sitting balance-Leahy Scale: Fair     Standing balance support: During functional activity Standing balance-Leahy Scale: Poor                             ADL either performed or assessed with clinical judgement   ADL Overall ADL's : Needs assistance/impaired     Grooming: Wash/dry hands;Wash/dry face;Sitting;Set up;Supervision/safety               Lower Body Dressing: Sitting/lateral leans;Moderate assistance Lower Body Dressing Details (indicate cue type and reason): Pt needing min guard for balance when attempting to don socks but could not thread them onto feet without assistance. Toilet Transfer: Moderate assistance;RW;Ambulation Toilet Transfer Details (indicate cue type and reason): simulated           General ADL Comments: Pt with strong posterior bias with standing tasks without use of RW. It is impoved once AD provided but pt needing mod - max multimodal cuing for upright posture.     Vision Patient Visual Report:  No change from baseline              Pertinent Vitals/Pain Pain Assessment: No/denies pain     Hand Dominance Left   Extremity/Trunk Assessment Upper Extremity Assessment Upper Extremity Assessment: Generalized weakness;Overall [facility] for tasks assessed   Lower Extremity Assessment Lower Extremity Assessment: Generalized weakness;Overall [facility] for tasks assessed    Cervical / Trunk Assessment Cervical / Trunk Assessment: Kyphotic   Communication Communication Communication: No difficulties   Cognition Arousal/Alertness: Awake/alert Behavior During Therapy: Flat affect Overall Cognitive Status: Impaired/Different from baseline Area of Impairment: Orientation;Attention;Following commands;Safety/judgement;Awareness                 Orientation Level: Disoriented to;Time;Situation Current Attention Level: Sustained   Following Commands: Follows one step commands inconsistently;Follows one step commands with increased time Safety/Judgement: Decreased awareness of safety;Decreased awareness of deficits Awareness: Intellectual   General Comments: Pt verbalized year as "2000" and giving incorrect information about home set up that wife was able to correct. She verbalized this is "different from his normal". Pt needing min - mod cuing to attend to task, sequence, and initiate tasks.              Home Living Family/patient expects to be discharged to:: Private residence Living Arrangements: Spouse/significant other Available Help at Discharge: Family;Available 24 hours/day Type of Home: House Home Access: Stairs to enter [employer] of Steps: 3-4 Entrance Stairs-Rails: Right;Left;Can reach both Home Layout: Laundry or work area in basement;One level     Bathroom Shower/Tub: Tub/shower unit;Door   [employer] Toilet: Standard     Home Equipment: Environmental consultant - 2 wheels          Prior Functioning/Environment Level of Independence: Independent with assistive device(s)        Comments: Pt's wife present and reports pt is I in self care tasks at home without use of AD but does require increased time to complete tasks. Pt has RW but does not use it. She is with him 24/7 except when she goes to dialysis and at that time another family member comes over to stay with pt.        OT Problem List: Decreased strength;Decreased  coordination;Decreased activity tolerance;Decreased safety awareness;Decreased cognition;Impaired balance (sitting and/or standing);Decreased knowledge of use of DME or AE;Decreased knowledge of precautions      OT Treatment/Interventions: Self-care/ADL training;Therapeutic exercise;Therapeutic activities;Energy conservation;Neuromuscular education;Cognitive remediation/compensation;DME and/or AE instruction;Patient/family education;Balance training;Manual therapy    OT Goals(Current goals can be found in the care plan section) Acute Rehab OT Goals Patient Stated Goal: to get stronger OT Goal Formulation: With patient/family Time For Goal Achievement: 04/15/20 Potential to Achieve Goals: Good ADL Goals Pt Will Perform Grooming: with supervision;standing Pt Will Perform Lower Body Dressing: with supervision;sit to/from stand Pt Will Transfer to Toilet: with supervision;ambulating Pt Will Perform Toileting - Clothing Manipulation and hygiene: with supervision;sit to/from stand  OT Frequency: Min 1X/week    AM-PAC OT "6 Clicks" Daily Activity     Outcome Measure Help from another person eating meals?: A Little Help from another person taking care of personal grooming?: A Little Help from another person toileting, which includes using toliet, bedpan, or urinal?: A Lot Help from another person bathing (including washing, rinsing, drying)?: A Lot Help from another person to put on and taking off regular upper body clothing?: A Little Help from another person to put on and taking off regular lower body clothing?: A Lot 6 Click Score: 15   End of Session Equipment Utilized  During Treatment: Rolling walker Nurse Communication: Mobility status;Precautions  Activity Tolerance: Patient tolerated treatment well Patient left: in bed;with call bell/phone within reach;with bed alarm set;with family/visitor present  OT Visit Diagnosis: Unsteadiness on feet (R26.81);Muscle weakness (generalized)  (M62.81);History of falling (Z91.81)                Time: 9198-0221 OT Time Calculation (min): 31 min Charges:  OT General Charges $OT Visit: 1 Visit OT Evaluation $OT Eval Moderate Complexity: 1 Mod OT Treatments $Self Care/Home Management : 8-22 mins $Therapeutic Activity: 8-22 mins  Darleen Crocker, MS, OTR/L , CBIS ascom [phone]  04/01/20, 3:56 PM

## 2020-04-02 ENCOUNTER — Inpatient Hospital Stay: Payer: Medicare PPO

## 2020-04-02 LAB — BASIC METABOLIC PANEL
Anion gap: 10 (ref 5–15)
BUN: 17 mg/dL (ref 8–23)
CO2: 22 mmol/L (ref 22–32)
Calcium: 8.2 mg/dL — ABNORMAL LOW (ref 8.9–10.3)
Chloride: 107 mmol/L (ref 98–111)
Creatinine, Ser: 1.53 mg/dL — ABNORMAL HIGH (ref 0.61–1.24)
GFR, Estimated: 49 mL/min — ABNORMAL LOW (ref >60)
Glucose, Bld: 176 mg/dL — ABNORMAL HIGH (ref 70–99)
Potassium: 3.3 mmol/L — ABNORMAL LOW (ref 3.5–5.1)
Sodium: 139 mmol/L (ref 135–145)

## 2020-04-02 LAB — GLUCOSE, CAPILLARY
Glucose-Capillary: 241 mg/dL — ABNORMAL HIGH (ref 70–99)
Glucose-Capillary: 255 mg/dL — ABNORMAL HIGH (ref 70–99)
Glucose-Capillary: 169 mg/dL — ABNORMAL HIGH (ref 70–99)
Glucose-Capillary: 165 mg/dL — ABNORMAL HIGH (ref 70–99)

## 2020-04-02 LAB — MAGNESIUM: Magnesium: 2 mg/dL (ref 1.7–2.4)

## 2020-04-02 MED ORDER — MELATONIN 5 MG PO TABS
5.0000 mg | ORAL_TABLET | Freq: Every day | ORAL | Status: DC
  Administered 2020-04-02 – 2020-04-05 (×4): 5 mg via ORAL
  Filled 2020-04-02 (×5): qty 1

## 2020-04-02 MED ORDER — METOPROLOL TARTRATE 5 MG/5ML IV SOLN
5.0000 mg | INTRAVENOUS | Status: DC | PRN
  Administered 2020-04-02: 16:00:00 5 mg via INTRAVENOUS
  Filled 2020-04-02: qty 5

## 2020-04-02 MED ORDER — POTASSIUM CHLORIDE CRYS ER 20 MEQ PO TBCR
40.0000 meq | EXTENDED_RELEASE_TABLET | Freq: Once | ORAL | Status: AC
  Administered 2020-04-02: 40 meq via ORAL
  Filled 2020-04-02: qty 2

## 2020-04-02 NOTE — Care Management (Signed)
Received message from RN stating telemetry noted tachycardia HR in 160s.  I evaluated patient at bedside.  He is hypertensive, otherwise asymptomatic.  Tachycardia resolved spontaneously, then recurred.  STAT EKG could not capture the tachycardia unfortunately.  EKG showed NSR possible 1st degree block.  Suspect PSVT vs afib vs flutter.  Difficult to distiguish  Plan: Continue monitoring on telemetry Vitals per unit protocol Metoprolol 5mg  IV for sustained tachycardia Complete echo If tachycardia recurs and we are unable to break, will transfer to PCU. Consider cardiology consult if echo abnl   Ralene Muskrat MD

## 2020-04-02 NOTE — Evaluation (Signed)
Physical Therapy Evaluation Patient Details Name: Steven Ingram MRN: [id number] DOB: 06-02-1950 Today's Date: 04/02/2020   History of Present Illness  69 y.o. male with medical history significant of hypertension, hyperlipidemia, dCHF, diabetes mellitus, stroke, depression, CAD, s/p for right nephrectomy, CKD-3A, tobacco abuse, who presents with altered mental status.  Clinical Impression  Pt disoriented to time and place at this evaluation. Requires HOB elevated and modA for supine to sit, and modA to remain sitting for <30sec once sitting EOB d/t heavy posterior trunk lean. Patient is able to correct posterior trunk lean in sitting with multimodal cuing 50%. MaxA needed for initiation of stand with RW with heavy cuing needed for safety and hand placement. MaxA continues to be required for patient to remain standing without LOB d/t continued posterior lean. Through x2 trials of standing patient is unable to sense posterior lean or correct in standing, reporting he "does not understand how he is losing his balance" each time PT assists him back to sitting. Patient is unable to safely attempt ambulation. MD made aware of patient confusion and inability to correct equilibrium. PT recommendation is SNF placement d/t lack of safety awareness and safety/independence with basic transfers. Would benefit from skilled PT to address above deficits and promote optimal return to PLOF.     Follow Up Recommendations SNF    Equipment Recommendations  Rolling walker with 5" wheels    Recommendations for Other Services       Precautions / Restrictions Precautions Precautions: Fall      Mobility  Bed Mobility Overal bed mobility: Needs Assistance Bed Mobility: Supine to Sit;Sit to Supine     Supine to sit: Mod assist;HOB elevated Sit to supine: Mod assist   General bed mobility comments: Pt needing assistance for sequencing with bedrails and physical assistance for trunk. Once sitting EOB modA  needed initially to remain seated, with patietn able to progress to supervision, but heavy cuing to prevent posterior trunk lean    Transfers Overall transfer level: Needs assistance Equipment used: Rolling walker (2 wheeled) Transfers: Sit to/from Stand Sit to Stand: Max assist         General transfer comment: Cuing for sequencing/hand placement, maxA for stand, heavy assistance for initiation x2 trials. Pt also requires maxA to remain standing d/t heavy posterior lean that patient is unable to correct  Ambulation/Gait             General Gait Details: unable to safely attempt gait  Stairs            Wheelchair Mobility    Modified Rankin (Stroke Patients Only)       Balance Overall balance assessment: Needs assistance   Sitting balance-Leahy Scale: Fair     Standing balance support: During functional activity;Bilateral upper extremity supported Standing balance-Leahy Scale: Zero Standing balance comment: unable to remain standing d/t posterior lean, unable to identify or correct this despite max cuing                             Pertinent Vitals/Pain Pain Assessment: No/denies pain    Home Living Family/patient expects to be discharged to:: Private residence Living Arrangements: Spouse/significant other Available Help at Discharge: Family;Available 24 hours/day Type of Home: House Home Access: Stairs to enter Entrance Stairs-Rails: Right;Left;Can reach both Entrance Stairs-Number of Steps: 3-4 Home Layout: Laundry or work area in basement;One level Home Equipment: Walker - 2 wheels Additional Comments: need more updates once caregiver is available.  Prior Function Level of Independence: Independent with assistive device(s)         Comments: Pt's wife present and reports pt is I in self care tasks at home without use of AD but does require increased time to complete tasks. Pt has RW but does not use it. She is with him 24/7 except  when she goes to dialysis and at that time another family member comes over to stay with pt.     Hand Dominance   Dominant Hand: Left    Extremity/Trunk Assessment   Upper Extremity Assessment Upper Extremity Assessment: Defer to OT evaluation;Generalized weakness    Lower Extremity Assessment Lower Extremity Assessment: Generalized weakness;Defer to PT evaluation    Cervical / Trunk Assessment Cervical / Trunk Assessment: Kyphotic  Communication   Communication: No difficulties  Cognition Arousal/Alertness: Awake/alert Behavior During Therapy: Flat affect Overall Cognitive Status: Impaired/Different from baseline Area of Impairment: Attention;Safety/judgement;Awareness                 Orientation Level: Disoriented to;Time;Situation Current Attention Level: Sustained   Following Commands: Follows one step commands inconsistently;Follows one step commands with increased time Safety/Judgement: Decreased awareness of safety;Decreased awareness of deficits Awareness: Intellectual   General Comments: Patient with heavy posterior lean in sitting and standing, with LOB. Unable to identify this as reason he is unable to remain standing or correct.      General Comments      Exercises     Assessment/Plan    PT Assessment Patient needs continued PT services  PT Problem List Decreased strength;Decreased mobility;Decreased safety awareness;Decreased range of motion;Decreased coordination;Decreased knowledge of precautions;Decreased activity tolerance;Decreased balance;Decreased knowledge of use of DME       PT Treatment Interventions DME instruction;Therapeutic activities;Gait training;Therapeutic exercise;Stair training;Balance training;Functional mobility training;Neuromuscular re-education;Manual techniques;Patient/family education    PT Goals (Current goals can be found in the Care Plan section)  Acute Rehab PT Goals Patient Stated Goal: to go home PT Goal  Formulation: With patient Time For Goal Achievement: 04/16/20 Potential to Achieve Goals: Fair    Frequency Min 2X/week   Barriers to discharge Decreased caregiver support;Inaccessible home environment      Co-evaluation               AM-PAC PT "6 Clicks" Mobility  Outcome Measure Help needed turning from your back to your side while in a flat bed without using bedrails?: A Little Help needed moving from lying on your back to sitting on the side of a flat bed without using bedrails?: A Little Help needed moving to and from a bed to a chair (including a wheelchair)?: A Lot Help needed standing up from a chair using your arms (e.g., wheelchair or bedside chair)?: A Lot Help needed to walk in hospital room?: Total Help needed climbing 3-5 steps with a railing? : Total 6 Click Score: 12    End of Session Equipment Utilized During Treatment: Gait belt Activity Tolerance: Treatment limited secondary to medical complications (Comment) Patient left: in bed;with call bell/phone within reach;with bed alarm set Nurse Communication: Mobility status;Precautions PT Visit Diagnosis: Unsteadiness on feet (R26.81);Muscle weakness (generalized) (M62.81);Other abnormalities of gait and mobility (R26.89);Difficulty in walking, not elsewhere classified (R26.2);Dizziness and giddiness (R42);Other symptoms and signs involving the nervous system (R29.898)    Time: 9211-9417 PT Time Calculation (min) (ACUTE ONLY): 24 min   Charges:   PT Evaluation $PT Eval Moderate Complexity: 1 Mod PT Treatments $Therapeutic Activity: 23-37 mins      Durwin Reges DPT  Durwin Reges 04/02/2020, 10:42 AM

## 2020-04-02 NOTE — Progress Notes (Signed)
Notified by CCMD that patient heart rate was registering in the 160s. Assessed patient and he was having no distress or pain. Notified charge nurse and Dr. Priscella Mann. He came up and assessed patient, gave a dose of 5mg  metoprolol and patient rate regulated back down into SR. He stated to monitor patient and if his rate became unregulated again he would probably have to start him on cardizem drip. Patient is resting in bed with call bell in reach. No distress.

## 2020-04-02 NOTE — Progress Notes (Signed)
PROGRESS NOTE    Steven Ingram  ERD:[id number] DOB: 1950-05-31 DOA: 03/31/2020 PCP[facility] [employer]    Brief Narrative:  69 y.o. male with medical history significant of hypertension, hyperlipidemia, dCHF, diabetes mellitus, stroke, depression, CAD, s/p for right nephrectomy, CKD-3A, tobacco abuse, who presents with altered mental status.  Per his wife (I called his wife by phone), pt become confused this AM. He has has generalized weakness, could not get up normally. Wife states that pt wears denture which makes him difficult speaking normally.  Patient does not seem to have chest pain, cough, shortness of breath.  Patient had a diarrhea recently which has resolved per his wife, currently patient does not have nausea vomiting, diarrhea or abdominal pain.  Patient has bilateral lower leg edema. He received his 3rd dose of COVID-19 booster yesterday. He developed fever. His body temperature is 101.2 in ED  12/17: T-max over interval one1.3.  Started on vancomycin and cefepime.  Stable this morning.  Alert, oriented x2.  12/18: No fevers over interval.  Negative procalcitonin.  Antibiotics discontinued.  Therapy evaluated patient and found him to be significantly ataxic.   Assessment & Plan:   Principal Problem:   Acute metabolic encephalopathy Active Problems:   History of CVA (cerebrovascular accident)   Type 2 diabetes mellitus with stage 3 chronic kidney disease (HCC)   CAD (coronary artery disease)   HTN (hypertension)   SIRS (systemic inflammatory response syndrome) (HCC)   Acute renal failure superimposed on stage 3a chronic kidney disease (HCC)   HLD (hyperlipidemia)   Chronic diastolic CHF (congestive heart failure) (HCC)   Encephalopathy acute  Acute metabolic encephalopathy:  Etiology is not clear.   CT head is negative for acute intracranial abnormalities.   No focal neuro deficit on physical examination.   Low suspicions for meningitis.   Likely  multifactorial etiology, including fever, SIRS, worsening renal function.   No source of infection identified.   Patient has fever, which is likely due to COVID-19 vaccine booster shot. Baseline mental status is unclear however patient appears to be improving Therapy is evaluated patient and found him to be ataxic recommending skilled nursing facility Plan: Frequent neuro checks Delirium precautions Frequent reorientation PT and OT evaluations TOC consult for SNF placement  SIRS (systemic inflammatory response syndrome)  Patient meets criteria for SIRS with tachycardia with heart rate of 106 and fever with body temperature 101.2.  Lactic acid is normal.   No source of infection identified.   Urinalysis negative chest x-ray negative Lactic acid normal 1.6. Patient received 1 dose of Rocephin in ED Antibiotic coverage expanded to vancomycin and cefepime after fever 101.3 Suspect fever and SIRS due to vaccine Negative procalcitonin Low/no suspicion for infectious process Plan: DC IV fluid DC all antibiotics Follow blood and urine cultures  Ataxia Unclear etiology Patient has history of CVA is already on antiplatelet therapy Plan: MRI brain without contrast Continue antiplatelet agents  History of CVA (cerebrovascular accident) -Lipitor and plavix  Type 2 diabetes mellitus with stage 3 chronic kidney disease (HCC)  Recent A1c 5.1, well controlled.   Patient is not taking medications currently.   -SSI  CAD (coronary artery disease) -Continue Lipitor  HTN (hypertension):  Blood pressure 178/95 on admission -IV hydralazine as needed -Continue home amlodipine, Cardizem -Hold lisinopril due to worsening renal function  Acute renal failure superimposed on stage 3a chronic kidney disease ([place]):  Baseline Cre is 1.32 on 07/19/19, pt's Cre is 1.67 and BUN 16 on admission.  Likely due to dehydration and continuation of ACEI Improving over interval Plan: -Hold IVF for  now -Daily BMP -Avoid nephrotoxins  HLD (hyperlipidemia):  -Lipitor  Chronic diastolic CHF (congestive heart failure) ([place])  2D echo on 07/20/2019 showed EF of 55 to 60% with grade 1 diastolic dysfunction.  Patient has had 1+ bilateral leg edema, but no respiratory distress.  Chest x-ray did not show pulmonary edema.  BNP 65.9.   Does not seem to have CHF exacerbation. -Diuretics on hold considering AKI -Watch volume status closely   DVT prophylaxis: Lovenox Code Status: Full Family Communication: Daughter [name] [phone] on 04/02/2020 Disposition Plan:Status is: Inpatient  Remains inpatient appropriate because:Altered mental status and Inpatient level of care appropriate due to severity of illness   Dispo: The patient is from: Home              Anticipated d/c is to: SNF              Anticipated d/c date is: 1 day              Patient currently is not medically stable to d/c.   Infection has been essentially ruled out at this point.  Suspect the fevers were due to the vaccine.  His mental status is improving but baseline is unclear.  Significant ataxia noted on therapy evaluations.  Pursuing imaging work-up for etiology of underlying ataxia.   Consultants:   None  Procedures:   None  Antimicrobials:     Subjective: Patient seen and examined.  Lying in bed.  Alert, oriented x2.  No pain complaints.  Objective: Vitals:   04/01/20 1743 04/01/20 2005 04/02/20 0402 04/02/20 0840  BP: (!) 177/91 (!) 168/87 (!) 172/88 (!) 173/99  Pulse: 99 (!) 103 97 (!) 105  Resp: 18 18 20 16   Temp: 98.7 F (37.1 C) 98.7 F (37.1 C) (!) 97.5 F (36.4 C) 97.8 F (36.6 C)  TempSrc:  Oral Oral Oral  SpO2: 100% 100% 99% 100%  Weight:      Height:        Intake/Output Summary (Last 24 hours) at 04/02/2020 1105 Last data filed at 04/01/2020 1930 Gross per 24 hour  Intake 1710.56 ml  Output --  Net 1710.56 ml   Filed Weights   03/31/20 0924  Weight: 94 kg     Examination:  General exam: No acute distress Respiratory system: Mild bilateral scattered crackles.  Normal work of breathing.  Room air Cardiovascular system: S1 & S2 heard, RRR. No JVD, murmurs, rubs, gallops or clicks. No pedal edema. Gastrointestinal system: Abdomen is nondistended, soft and nontender. No organomegaly or masses felt. Normal bowel sounds heard. Central nervous system: Alert, oriented x2, no focal deficits Extremities: Symmetric 5 x 5 power. Skin: No rashes, lesions or ulcers Psychiatry: Judgement and insight appear impaired. Mood & affect appropriate.     Data Reviewed: I have personally reviewed following labs and imaging studies  CBC: Recent Labs  Lab 03/31/20 1007 04/01/20 0520  WBC 4.1 3.3*  NEUTROABS 3.2  --   HGB 12.5* 12.2*  HCT 36.6* 34.5*  MCV 95.1 93.0  PLT 176 086   Basic Metabolic Panel: Recent Labs  Lab 03/31/20 1007 03/31/20 2039 04/01/20 0520  NA 143  --  137  K 3.5  --  3.6  CL 98  --  106  CO2 24  --  21*  GLUCOSE 210*  --  185*  BUN 16  --  15  CREATININE 1.67*  --  1.61*  CALCIUM 8.9  --  8.2*  MG  --  1.7  --    GFR: Estimated Creatinine Clearance: 48.3 mL/min (A) (by C-G formula based on SCr of 1.61 mg/dL (H)). Liver Function Tests: Recent Labs  Lab 03/31/20 1007  AST 17  ALT 15  ALKPHOS 50  BILITOT 1.3*  PROT 6.4*  ALBUMIN 3.3*   No results for input(s): LIPASE, AMYLASE in the last 168 hours. Recent Labs  Lab 03/31/20 1007  AMMONIA <9*   Coagulation Profile: Recent Labs  Lab 03/31/20 1007  INR 1.0   Cardiac Enzymes: No results for input(s): CKTOTAL, CKMB, CKMBINDEX, TROPONINI in the last 168 hours. BNP (last 3 results) No results for input(s): PROBNP in the last 8760 hours. HbA1C: No results for input(s): HGBA1C in the last 72 hours. CBG: Recent Labs  Lab 04/01/20 0801 04/01/20 1222 04/01/20 1742 04/01/20 2005 04/02/20 0906  GLUCAP 175* 209* 246* 243* 241*   Lipid Profile: No results  for input(s): CHOL, HDL, LDLCALC, TRIG, CHOLHDL, LDLDIRECT in the last 72 hours. Thyroid Function Tests: Recent Labs    03/31/20 1111 04/01/20 0520  TSH 5.512*  --   FREET4  --  0.78   Anemia Panel: No results for input(s): VITAMINB12, FOLATE, FERRITIN, TIBC, IRON, RETICCTPCT in the last 72 hours. Sepsis Labs: Recent Labs  Lab 03/31/20 1007 03/31/20 1340  PROCALCITON  --  <0.10  LATICACIDVEN 1.6  --     Recent Results (from the past 240 hour(s))  Resp Panel by RT-PCR (Flu A&B, Covid) Nasopharyngeal Swab     Status: None   Collection Time: 03/31/20 10:07 AM   Specimen: Nasopharyngeal Swab; Nasopharyngeal(NP) swabs in vial transport medium  Result Value Ref Range Status   SARS Coronavirus 2 by RT PCR NEGATIVE NEGATIVE Final    Comment: (NOTE) SARS-CoV-2 target nucleic acids are NOT DETECTED.  The SARS-CoV-2 RNA is generally detectable in upper respiratory specimens during the acute phase of infection. The lowest concentration of SARS-CoV-2 viral copies this assay can detect is 138 copies/mL. A negative result does not preclude SARS-Cov-2 infection and should not be used as the sole basis for treatment or other patient management decisions. A negative result may occur with  improper specimen collection/handling, submission of specimen other than nasopharyngeal swab, presence of viral mutation(s) within the areas targeted by this assay, and inadequate number of viral copies(<138 copies/mL). A negative result must be combined with clinical observations, patient history, and epidemiological information. The expected result is Negative.  Fact Sheet for Patients:  EntrepreneurPulse.com.au  Fact Sheet for Healthcare Providers:  IncredibleEmployment.be  This test is no t yet approved or cleared by the [place] FDA and  has been authorized for detection and/or diagnosis of SARS-CoV-2 by FDA under an Emergency Use Authorization (EUA). This  EUA will remain  in effect (meaning this test can be used) for the duration of the COVID-19 declaration under Section 564(b)(1) of the Act, 21 U.S.C.section 360bbb-3(b)(1), unless the authorization is terminated  or revoked sooner.       Influenza A by PCR NEGATIVE NEGATIVE Final   Influenza B by PCR NEGATIVE NEGATIVE Final    Comment: (NOTE) The Xpert Xpress SARS-CoV-2/FLU/RSV plus assay is intended as an aid in the diagnosis of influenza from Nasopharyngeal swab specimens and should not be used as a sole basis for treatment. Nasal washings and aspirates are unacceptable for Xpert Xpress SARS-CoV-2/FLU/RSV testing.  Fact Sheet for Patients: EntrepreneurPulse.com.au  Fact Sheet for Healthcare Providers: IncredibleEmployment.be  This test is not yet approved or cleared by the [place] and has been authorized for detection and/or diagnosis of SARS-CoV-2 by FDA under an Emergency Use Authorization (EUA). This EUA will remain in effect (meaning this test can be used) for the duration of the COVID-19 declaration under Section 564(b)(1) of the Act, 21 U.S.C. section 360bbb-3(b)(1), unless the authorization is terminated or revoked.  Performed at [facility], [address] [place], [place] [address]   Culture, blood (Routine X 2) w Reflex to ID Panel     Status: None (Preliminary result)   Collection Time: 03/31/20 10:07 AM   Specimen: BLOOD LEFT ARM  Result Value Ref Range Status   Specimen Description BLOOD LEFT ARM  Final   Special Requests   Final    BOTTLES DRAWN AEROBIC AND ANAEROBIC Blood Culture results may not be optimal due to an inadequate volume of blood received in culture bottles   Culture   Final    NO GROWTH 2 DAYS Performed at [facility], [address] [place], [place] [address]    Report Status PENDING  Incomplete  Culture, blood (Routine X 2) w Reflex to ID Panel     Status: None  (Preliminary result)   Collection Time: 03/31/20 10:07 AM   Specimen: BLOOD RIGHT HAND  Result Value Ref Range Status   Specimen Description BLOOD RIGHT HAND  Final   Special Requests   Final    BOTTLES DRAWN AEROBIC AND ANAEROBIC Blood Culture results may not be optimal due to an inadequate volume of blood received in culture bottles   Culture   Final    NO GROWTH 2 DAYS Performed at [facility], [address] [place], [place] [address]    Report Status PENDING  Incomplete  Urine Culture     Status: None   Collection Time: 03/31/20 10:07 AM   Specimen: Urine, Random  Result Value Ref Range Status   Specimen Description   Final    URINE, RANDOM Performed at [facility], [address], [place], [place] [address]    Special Requests   Final    NONE Performed at [facility], [address], [place], [place] [address]    Culture   Final    NO GROWTH Performed at [facility], [place] [address], [place], [place] [address]    Report Status 04/01/2020 FINAL  Final         Radiology Studies: No results found.      Scheduled Meds: . amLODipine  10 mg Oral Daily  . atorvastatin  80 mg Oral Daily  . clopidogrel  75 mg Oral Daily  . diltiazem  60 mg Oral TID  . enoxaparin (LOVENOX) injection  47.5 mg Subcutaneous Q24H  . insulin aspart  0-5 Units Subcutaneous QHS  . insulin aspart  0-9 Units Subcutaneous TID WC  . nicotine  21 mg Transdermal Daily  . QUEtiapine  50 mg Oral QHS  . risperiDONE  1 mg Oral QHS  . sertraline  25 mg Oral Daily  . tamsulosin  0.4 mg Oral Daily   Continuous Infusions:    LOS: 1 day    Time spent: 25 minutes    Sidney Ace, MD [facility] Pager [phone]  If 7PM-7AM, please contact night-coverage 04/02/2020, 11:05 AM

## 2020-04-03 ENCOUNTER — Inpatient Hospital Stay
Admit: 2020-04-03 | Discharge: 2020-04-03 | Disposition: A | Discharge status: Home / Self Care | Payer: Medicare PPO | Attending: Internal Medicine | Admitting: Internal Medicine

## 2020-04-03 LAB — ECHOCARDIOGRAM COMPLETE
AR max vel: 2.8 cm2
AV Area VTI: 2.64 cm2
AV Area mean vel: 2.7 cm2
AV Mean grad: 5 mmHg
AV Peak grad: 8 mmHg
Ao pk vel: 1.41 m/s
Area-P 1/2: 2.65 cm2
Height: 69 in
S' Lateral: 3.07 cm
Weight: 3315.72 oz

## 2020-04-03 LAB — BASIC METABOLIC PANEL
Anion gap: 8 (ref 5–15)
BUN: 18 mg/dL (ref 8–23)
CO2: 21 mmol/L — ABNORMAL LOW (ref 22–32)
Calcium: 8.1 mg/dL — ABNORMAL LOW (ref 8.9–10.3)
Chloride: 106 mmol/L (ref 98–111)
Creatinine, Ser: 1.49 mg/dL — ABNORMAL HIGH (ref 0.61–1.24)
GFR, Estimated: 50 mL/min — ABNORMAL LOW (ref >60)
Glucose, Bld: 245 mg/dL — ABNORMAL HIGH (ref 70–99)
Potassium: 3.5 mmol/L (ref 3.5–5.1)
Sodium: 135 mmol/L (ref 135–145)

## 2020-04-03 LAB — GLUCOSE, CAPILLARY
Glucose-Capillary: 192 mg/dL — ABNORMAL HIGH (ref 70–99)
Glucose-Capillary: 214 mg/dL — ABNORMAL HIGH (ref 70–99)
Glucose-Capillary: 233 mg/dL — ABNORMAL HIGH (ref 70–99)
Glucose-Capillary: 159 mg/dL — ABNORMAL HIGH (ref 70–99)

## 2020-04-03 LAB — CBC
HCT: 32.3 % — ABNORMAL LOW (ref 39.0–52.0)
Hemoglobin: 11.2 g/dL — ABNORMAL LOW (ref 13.0–17.0)
MCH: 32.9 pg (ref 26.0–34.0)
MCHC: 34.7 g/dL (ref 30.0–36.0)
MCV: 95 fL (ref 80.0–100.0)
Platelets: 128 10*3/uL — ABNORMAL LOW (ref 150–400)
RBC: 3.4 MIL/uL — ABNORMAL LOW (ref 4.22–5.81)
RDW: 11.6 % (ref 11.5–15.5)
WBC: 3.3 10*3/uL — ABNORMAL LOW (ref 4.0–10.5)
nRBC: 0 % (ref 0.0–0.2)

## 2020-04-03 LAB — MAGNESIUM: Magnesium: 1.9 mg/dL (ref 1.7–2.4)

## 2020-04-03 LAB — HEMOGLOBIN A1C
Hgb A1c MFr Bld: 7 % — ABNORMAL HIGH (ref 4.8–5.6)
Mean Plasma Glucose: 154.2 mg/dL

## 2020-04-03 MED ORDER — DILTIAZEM HCL 30 MG PO TABS
90.0000 mg | ORAL_TABLET | Freq: Three times a day (TID) | ORAL | Status: DC
  Administered 2020-04-03 – 2020-04-04 (×3): 90 mg via ORAL
  Filled 2020-04-03 (×3): qty 3

## 2020-04-03 MED ORDER — LISINOPRIL 20 MG PO TABS
40.0000 mg | ORAL_TABLET | Freq: Every day | ORAL | Status: DC
  Administered 2020-04-03 – 2020-04-04 (×2): 40 mg via ORAL
  Filled 2020-04-03 (×3): qty 2

## 2020-04-03 NOTE — Progress Notes (Signed)
PROGRESS NOTE    Steven Ingram  UEA:[id number] DOB: [birth year] DOA: 03/31/2020 PCP: [facility] [employer]    Brief Narrative:  69 y.o. male with medical history significant of hypertension, hyperlipidemia, dCHF, diabetes mellitus, stroke, depression, CAD, s/p for right nephrectomy, CKD-3A, tobacco abuse, who presents with altered mental status.  Per his wife (I called his wife by phone), pt become confused this AM. He has has generalized weakness, could not get up normally. Wife states that pt wears denture which makes him difficult speaking normally.  Patient does not seem to have chest pain, cough, shortness of breath.  Patient had a diarrhea recently which has resolved per his wife, currently patient does not have nausea vomiting, diarrhea or abdominal pain.  Patient has bilateral lower leg edema. He received his 3rd dose of COVID-19 booster yesterday. He developed fever. His body temperature is 101.2 in ED  12/17: T-max over interval one1.3.  Started on vancomycin and cefepime.  Stable this morning.  Alert, oriented x2.  12/18: No fevers over interval.  Negative procalcitonin.  Antibiotics discontinued.  Therapy evaluated patient and found him to be significantly ataxic.   Assessment & Plan:   Principal Problem:   Acute metabolic encephalopathy Active Problems:   History of CVA (cerebrovascular accident)   Type 2 diabetes mellitus with stage 3 chronic kidney disease (HCC)   CAD (coronary artery disease)   HTN (hypertension)   SIRS (systemic inflammatory response syndrome) (HCC)   Acute renal failure superimposed on stage 3a chronic kidney disease (HCC)   HLD (hyperlipidemia)   Chronic diastolic CHF (congestive heart failure) (HCC)   Encephalopathy acute  Acute metabolic encephalopathy:  Etiology is not clear.   CT head is negative for acute intracranial abnormalities.   No focal neuro deficit on physical examination.   Low suspicions for meningitis.   Likely  multifactorial etiology, including fever, SIRS, worsening renal function.   No source of infection identified.   Patient has fever, which is likely due to COVID-19 vaccine booster shot. --does take sedatives Seroquel and Risperdal at home. Plan: --Monitor mental status and oral intake --Delirium precautions --cont home Seroquel and Risperdal since mental status is improving  SIRS (systemic inflammatory response syndrome)  Patient meets criteria for SIRS with tachycardia with heart rate of 106 and fever with body temperature 101.2.  Lactic acid is normal.   No source of infection identified.   Urinalysis negative chest x-ray negative Patient received 1 dose of Rocephin in ED Antibiotic coverage expanded to vancomycin and cefepime after fever 101.3 Suspect fever and SIRS due to vaccine Negative procalcitonin Low/no suspicion for infectious process, abx d/c'ed. Plan: --Hold abx --f/u blood cx  Ataxia Unclear etiology Patient has history of CVA is already on antiplatelet therapy.   --MRI brain without acute finding. Plan: --PT rec SNF rehab  History of CVA (cerebrovascular accident) --MRI brain without acute finding. --cont home statin and plavix  Type 2 diabetes mellitus with stage 3 chronic kidney disease ([place]) Hyperglycemia Patient is not taking medications currently.   --BG in 200's --check A1c  --SSI for now  CAD (coronary artery disease) -Continue Lipitor  HTN (hypertension):  Blood pressure 178/95 on admission --increase home Dilt 60 mg TID to 90 mg TID --cont home amlodipine --resume home Lisinopril   Intermittent tachycardia --tele noted HR in 160's on 12/18. --s/p IV metop 5 mg Plan: --increase home Dilt 60 mg TID to 90 mg --keep on tele  Acute renal failure, ruled out stage 3a chronic kidney  disease [facility]):  Baseline Cre is 1.32 on 07/19/19, pt's Cre is 1.67 and BUN 16 on admission.  Cr increase does not meet criteria for AKI  HLD  (hyperlipidemia):  -Lipitor  Chronic diastolic CHF (congestive heart failure) ([place])  2D echo on 07/20/2019 showed EF of 55 to 60% with grade 1 diastolic dysfunction.  Patient has had 1+ bilateral leg edema, but no respiratory distress.  Chest x-ray did not show pulmonary edema.  BNP 65.9.   Does not seem to have CHF exacerbation.   DVT prophylaxis: Lovenox Code Status: Full Family Communication: wife updated at bedside today Disposition Plan:Status is: Inpatient  Remains inpatient appropriate because:Altered mental status and Inpatient level of care appropriate due to severity of illness   Dispo: The patient is from: Home              Anticipated d/c is to: SNF              Anticipated d/c date is: when bed available.               Patient currently is medically stable to d/c.     Consultants:   None  Procedures:   None  Antimicrobials:     Subjective: Mental status is better today.  Pt reported feeling more clear headed.  Eating more.  No pain.   Objective: Vitals:   04/03/20 0408 04/03/20 0727 04/03/20 1138 04/03/20 1633  BP: (!) 168/84 (!) 180/91 136/75 131/74  Pulse: 83 87 90 90  Resp: 16 18 17    Temp: 98.3 F (36.8 C) 98.3 F (36.8 C) 98 F (36.7 C) 98.5 F (36.9 C)  TempSrc:   Oral   SpO2: (!) 70% 100% 100% 98%  Weight:      Height:        Intake/Output Summary (Last 24 hours) at 04/03/2020 1728 Last data filed at 04/03/2020 1600 Gross per 24 hour  Intake 60 ml  Output 400 ml  Net -340 ml   Filed Weights   03/31/20 0924  Weight: 94 kg    Examination:  Constitutional: NAD, alert, oriented to person and place HEENT: conjunctivae and lids normal, EOMI CV: tachycardic, systolic murmur.  No cyanosis.   RESP: normal respiratory effort, on RA Extremities: mild swelling in BLE SKIN: warm, dry and intact Neuro: II - XII grossly intact.   Psych: Normal mood and affect.     Data Reviewed: I have personally reviewed following labs and imaging  studies  CBC: Recent Labs  Lab 03/31/20 1007 04/01/20 0520 04/03/20 1132  WBC 4.1 3.3* 3.3*  NEUTROABS 3.2  --   --   HGB 12.5* 12.2* 11.2*  HCT 36.6* 34.5* 32.3*  MCV 95.1 93.0 95.0  PLT 176 159 240*   Basic Metabolic Panel: Recent Labs  Lab 03/31/20 1007 03/31/20 2039 04/01/20 0520 04/02/20 1936 04/03/20 1132  NA 143  --  137 139 135  K 3.5  --  3.6 3.3* 3.5  CL 98  --  106 107 106  CO2 24  --  21* 22 21*  GLUCOSE 210*  --  185* 176* 245*  BUN 16  --  15 17 18   CREATININE 1.67*  --  1.61* 1.53* 1.49*  CALCIUM 8.9  --  8.2* 8.2* 8.1*  MG  --  1.7  --  2.0 1.9   GFR: Estimated Creatinine Clearance: 52.2 mL/min (A) (by C-G formula based on SCr of 1.49 mg/dL (H)). Liver Function Tests: Recent Labs  Lab  03/31/20 1007  AST 17  ALT 15  ALKPHOS 50  BILITOT 1.3*  PROT 6.4*  ALBUMIN 3.3*   No results for input(s): LIPASE, AMYLASE in the last 168 hours. Recent Labs  Lab 03/31/20 1007  AMMONIA <9*   Coagulation Profile: Recent Labs  Lab 03/31/20 1007  INR 1.0   Cardiac Enzymes: No results for input(s): CKTOTAL, CKMB, CKMBINDEX, TROPONINI in the last 168 hours. BNP (last 3 results) No results for input(s): PROBNP in the last 8760 hours. HbA1C: No results for input(s): HGBA1C in the last 72 hours. CBG: Recent Labs  Lab 04/02/20 1546 04/02/20 2014 04/03/20 0728 04/03/20 1139 04/03/20 1632  GLUCAP 169* 165* 192* 214* 233*   Lipid Profile: No results for input(s): CHOL, HDL, LDLCALC, TRIG, CHOLHDL, LDLDIRECT in the last 72 hours. Thyroid Function Tests: Recent Labs    04/01/20 0520  FREET4 0.78   Anemia Panel: No results for input(s): VITAMINB12, FOLATE, FERRITIN, TIBC, IRON, RETICCTPCT in the last 72 hours. Sepsis Labs: Recent Labs  Lab 03/31/20 1007 03/31/20 1340  PROCALCITON  --  <0.10  LATICACIDVEN 1.6  --     Recent Results (from the past 240 hour(s))  Resp Panel by RT-PCR (Flu A&B, Covid) Nasopharyngeal Swab     Status: None    Collection Time: 03/31/20 10:07 AM   Specimen: Nasopharyngeal Swab; Nasopharyngeal(NP) swabs in vial transport medium  Result Value Ref Range Status   SARS Coronavirus 2 by RT PCR NEGATIVE NEGATIVE Final    Comment: (NOTE) SARS-CoV-2 target nucleic acids are NOT DETECTED.  The SARS-CoV-2 RNA is generally detectable in upper respiratory specimens during the acute phase of infection. The lowest concentration of SARS-CoV-2 viral copies this assay can detect is 138 copies/mL. A negative result does not preclude SARS-Cov-2 infection and should not be used as the sole basis for treatment or other patient management decisions. A negative result may occur with  improper specimen collection/handling, submission of specimen other than nasopharyngeal swab, presence of viral mutation(s) within the areas targeted by this assay, and inadequate number of viral copies(<138 copies/mL). A negative result must be combined with clinical observations, patient history, and epidemiological information. The expected result is Negative.  Fact Sheet for Patients:  EntrepreneurPulse.com.au  Fact Sheet for Healthcare Providers:  IncredibleEmployment.be  This test is no t yet approved or cleared by the [place] FDA and  has been authorized for detection and/or diagnosis of SARS-CoV-2 by FDA under an Emergency Use Authorization (EUA). This EUA will remain  in effect (meaning this test can be used) for the duration of the COVID-19 declaration under Section 564(b)(1) of the Act, 21 U.S.C.section 360bbb-3(b)(1), unless the authorization is terminated  or revoked sooner.       Influenza A by PCR NEGATIVE NEGATIVE Final   Influenza B by PCR NEGATIVE NEGATIVE Final    Comment: (NOTE) The Xpert Xpress SARS-CoV-2/FLU/RSV plus assay is intended as an aid in the diagnosis of influenza from Nasopharyngeal swab specimens and should not be used as a sole basis for treatment.  Nasal washings and aspirates are unacceptable for Xpert Xpress SARS-CoV-2/FLU/RSV testing.  Fact Sheet for Patients: EntrepreneurPulse.com.au  Fact Sheet for Healthcare Providers: IncredibleEmployment.be  This test is not yet approved or cleared by the [place] FDA and has been authorized for detection and/or diagnosis of SARS-CoV-2 by FDA under an Emergency Use Authorization (EUA). This EUA will remain in effect (meaning this test can be used) for the duration of the COVID-19 declaration under Section 564(b)(1) of  the Act, 21 U.S.C. section 360bbb-3(b)(1), unless the authorization is terminated or revoked.  Performed at [facility], [address] [place], [place] [address]   Culture, blood (Routine X 2) w Reflex to ID Panel     Status: None (Preliminary result)   Collection Time: 03/31/20 10:07 AM   Specimen: BLOOD LEFT ARM  Result Value Ref Range Status   Specimen Description BLOOD LEFT ARM  Final   Special Requests   Final    BOTTLES DRAWN AEROBIC AND ANAEROBIC Blood Culture results may not be optimal due to an inadequate volume of blood received in culture bottles   Culture   Final    NO GROWTH 3 DAYS Performed at [facility], [address], [place], [place] [address]    Report Status PENDING  Incomplete  Culture, blood (Routine X 2) w Reflex to ID Panel     Status: None (Preliminary result)   Collection Time: 03/31/20 10:07 AM   Specimen: BLOOD RIGHT HAND  Result Value Ref Range Status   Specimen Description BLOOD RIGHT HAND  Final   Special Requests   Final    BOTTLES DRAWN AEROBIC AND ANAEROBIC Blood Culture results may not be optimal due to an inadequate volume of blood received in culture bottles   Culture   Final    NO GROWTH 3 DAYS Performed at [facility], [address], [place], [place] [address]    Report Status PENDING  Incomplete  Urine Culture     Status: None   Collection Time:  03/31/20 10:07 AM   Specimen: Urine, Random  Result Value Ref Range Status   Specimen Description   Final    URINE, RANDOM Performed at [facility], [address], [place], [place] [address]    Special Requests   Final    NONE Performed at [facility], [address] [place], [place] [address]    Culture   Final    NO GROWTH Performed at [facility], [place] [address], [place], [place] [address]    Report Status 04/01/2020 FINAL  Final         Radiology Studies: MR BRAIN WO CONTRAST  Result Date: 04/02/2020 CLINICAL DATA:  Persistent and recurrent dizziness EXAM: MRI HEAD WITHOUT CONTRAST TECHNIQUE: Multiplanar, multiecho pulse sequences of the brain and surrounding structures were obtained without intravenous contrast. COMPARISON:  Head CT 03/31/2020.  MRI 07/19/2019. FINDINGS: Brain: Diffusion imaging does not show any acute or subacute infarction. Punctate focus of T2 shine through in the right frontoparietal vertex subcortical white matter related to an old small infarction. Extensive chronic ischemic changes seen throughout the pons. Old infarctions in the middle cerebellar peduncle on the right. Cerebral hemispheres show old small vessel infarctions of the thalami and basal ganglia. There chronic small-vessel ischemic changes throughout the deep and subcortical white matter. There is an old lateral inferior right frontal cortical and subcortical infarction. There is an old infarction at the right temporal tip. These 2 findings could, alternatively, relate to previous head trauma. No large vessel territory infarction. No intra-axial mass lesion, hemorrhage, hydrocephalus or extra-axial collection. There is a 9 mm meningioma along the lateral left frontoparietal convexity without mass-effect upon brain. Vascular: Major vessels at the base of the brain show flow. Skull and upper cervical spine: Negative Sinuses/Orbits: Clear/normal Other: None IMPRESSION: 1.  No acute or reversible finding. Extensive chronic small-vessel ischemic changes throughout the brain as outlined above. 2. Old inferior lateral right frontal  cortical and subcortical infarction. Small old infarction at the right temporal tip. These 2 findings could alternatively relate to previous head trauma. 3. 9 mm meningioma along the lateral left frontoparietal convexity without mass-effect upon the brain. Electronically Signed   By: Nelson Chimes M.D.   On: 04/02/2020 15:01        Scheduled Meds: . amLODipine  10 mg Oral Daily  . atorvastatin  80 mg Oral Daily  . clopidogrel  75 mg Oral Daily  . diltiazem  90 mg Oral TID  . enoxaparin (LOVENOX) injection  47.5 mg Subcutaneous Q24H  . insulin aspart  0-5 Units Subcutaneous QHS  . insulin aspart  0-9 Units Subcutaneous TID WC  . lisinopril  40 mg Oral Daily  . melatonin  5 mg Oral QHS  . nicotine  21 mg Transdermal Daily  . QUEtiapine  50 mg Oral QHS  . risperiDONE  1 mg Oral QHS  . sertraline  25 mg Oral Daily  . tamsulosin  0.4 mg Oral Daily   Continuous Infusions:    LOS: 2 days    Enzo Bi, MD [facility] Pager [phone]  If 7PM-7AM, please contact night-coverage 04/03/2020, 5:28 PM

## 2020-04-04 LAB — BASIC METABOLIC PANEL
Anion gap: 9 (ref 5–15)
BUN: 19 mg/dL (ref 8–23)
CO2: 23 mmol/L (ref 22–32)
Calcium: 8.3 mg/dL — ABNORMAL LOW (ref 8.9–10.3)
Chloride: 107 mmol/L (ref 98–111)
Creatinine, Ser: 1.5 mg/dL — ABNORMAL HIGH (ref 0.61–1.24)
GFR, Estimated: 50 mL/min — ABNORMAL LOW (ref >60)
Glucose, Bld: 176 mg/dL — ABNORMAL HIGH (ref 70–99)
Potassium: 3.2 mmol/L — ABNORMAL LOW (ref 3.5–5.1)
Sodium: 139 mmol/L (ref 135–145)

## 2020-04-04 LAB — CBC
HCT: 31.9 % — ABNORMAL LOW (ref 39.0–52.0)
Hemoglobin: 11.2 g/dL — ABNORMAL LOW (ref 13.0–17.0)
MCH: 33 pg (ref 26.0–34.0)
MCHC: 35.1 g/dL (ref 30.0–36.0)
MCV: 94.1 fL (ref 80.0–100.0)
Platelets: 127 10*3/uL — ABNORMAL LOW (ref 150–400)
RBC: 3.39 MIL/uL — ABNORMAL LOW (ref 4.22–5.81)
RDW: 11.3 % — ABNORMAL LOW (ref 11.5–15.5)
WBC: 3.7 10*3/uL — ABNORMAL LOW (ref 4.0–10.5)
nRBC: 0 % (ref 0.0–0.2)

## 2020-04-04 LAB — GLUCOSE, CAPILLARY
Glucose-Capillary: 150 mg/dL — ABNORMAL HIGH (ref 70–99)
Glucose-Capillary: 311 mg/dL — ABNORMAL HIGH (ref 70–99)
Glucose-Capillary: 301 mg/dL — ABNORMAL HIGH (ref 70–99)

## 2020-04-04 LAB — MAGNESIUM: Magnesium: 2 mg/dL (ref 1.7–2.4)

## 2020-04-04 MED ORDER — DILTIAZEM HCL 30 MG PO TABS
120.0000 mg | ORAL_TABLET | Freq: Three times a day (TID) | ORAL | Status: DC
  Administered 2020-04-04 – 2020-04-06 (×6): 120 mg via ORAL
  Filled 2020-04-04 (×6): qty 4

## 2020-04-04 MED ORDER — INSULIN ASPART 100 UNIT/ML ~~LOC~~ SOLN
3.0000 [IU] | Freq: Three times a day (TID) | SUBCUTANEOUS | Status: DC
  Administered 2020-04-05: 17:00:00 3 [IU] via SUBCUTANEOUS
  Filled 2020-04-04 (×3): qty 1

## 2020-04-04 MED ORDER — POTASSIUM CHLORIDE CRYS ER 20 MEQ PO TBCR
40.0000 meq | EXTENDED_RELEASE_TABLET | Freq: Once | ORAL | Status: AC
  Administered 2020-04-04: 19:00:00 40 meq via ORAL
  Filled 2020-04-04 (×2): qty 2

## 2020-04-04 NOTE — Progress Notes (Signed)
Physical Therapy Treatment Patient Details Name: Steven Ingram MRN: [id number] DOB: 29-Jan-1951 Today's Date: 04/04/2020    History of Present Illness 69 y.o. male with medical history significant of hypertension, hyperlipidemia, dCHF, diabetes mellitus, stroke, depression, CAD, s/p for right nephrectomy, CKD-3A, tobacco abuse, who presents with altered mental status.    PT Comments    Ready for session.  To EOB with min assist and increased time. Steady in sitting with time but supervision for safety. Stood with min a x 2 initially to RW. He is able to march in place and sidestep up in bed.  After seated rest, is able to stand again and transition to recliner at bedside.  He has a moderate posterior/right lean and poor awareness/correction with affects his balance and safety.  He is unsafe to walk or transfer unassisted and while he only requires min a x 2, +2 assist is recommended for safety.  RN aware.    Wife reports pt was independent with no AD prior to admission.  While he did not walk much at home he is not at his baseline.  Agrees with SNF transition for rehab.   Follow Up Recommendations  SNF     Equipment Recommendations  Rolling walker with 5" wheels    Recommendations for Other Services       Precautions / Restrictions Precautions Precautions: Fall    Mobility  Bed Mobility Overal bed mobility: Needs Assistance Bed Mobility: Supine to Sit     Supine to sit: Min assist        Transfers Overall transfer level: Needs assistance Equipment used: Rolling walker (2 wheeled) Transfers: Sit to/from Stand Sit to Stand: Min assist;+2 safety/equipment;+2 physical assistance            Ambulation/Gait Ambulation/Gait assistance: Min assist;+2 safety/equipment;+2 physical assistance   Assistive device: Rolling walker (2 wheeled) Gait Pattern/deviations: Step-to pattern Gait velocity: decreased   General Gait Details: able to sidestep up in bed and on second  attempt transfer to recliner at bedside.  +2 assist for safety   Stairs             Wheelchair Mobility    Modified Rankin (Stroke Patients Only)       Balance Overall balance assessment: Needs assistance Sitting-balance support: Feet supported Sitting balance-Leahy Scale: Fair   Postural control: Right lateral lean;Posterior lean Standing balance support: During functional activity;Bilateral upper extremity supported Standing balance-Leahy Scale: Poor Standing balance comment: psot/right lean, poor correction and shufling steps                            Cognition Arousal/Alertness: Awake/alert Behavior During Therapy: WFL for tasks assessed/performed Overall Cognitive Status: Impaired/Different from baseline                                        Exercises      General Comments        Pertinent Vitals/Pain Pain Assessment: No/denies pain    Home Living                      Prior Function            PT Goals (current goals can now be found in the care plan section) Progress towards PT goals: Progressing toward goals    Frequency    Min 2X/week  PT Plan      Co-evaluation              AM-PAC PT "6 Clicks" Mobility   Outcome Measure  Help needed turning from your back to your side while in a flat bed without using bedrails?: A Little Help needed moving from lying on your back to sitting on the side of a flat bed without using bedrails?: A Little Help needed moving to and from a bed to a chair (including a wheelchair)?: A Lot Help needed standing up from a chair using your arms (e.g., wheelchair or bedside chair)?: A Lot Help needed to walk in hospital room?: A Lot Help needed climbing 3-5 steps with a railing? : Total 6 Click Score: 13    End of Session Equipment Utilized During Treatment: Gait belt Activity Tolerance: Treatment limited secondary to medical complications (Comment) Patient  left: in bed;with call bell/phone within reach;with bed alarm set Nurse Communication: Mobility status;Precautions PT Visit Diagnosis: Unsteadiness on feet (R26.81);Muscle weakness (generalized) (M62.81);Other abnormalities of gait and mobility (R26.89);Difficulty in walking, not elsewhere classified (R26.2);Dizziness and giddiness (R42);Other symptoms and signs involving the nervous system (R29.898)     Time: 8502-7741 PT Time Calculation (min) (ACUTE ONLY): 15 min  Charges:  $Therapeutic Activity: 8-22 mins                    Chesley Noon, PTA 04/04/20, 1:46 PM

## 2020-04-04 NOTE — Progress Notes (Signed)
Notified AC and MD of pt fall from chair

## 2020-04-04 NOTE — Care Management Important Message (Signed)
Important Message  Patient Details  Name: Steven Ingram MRN: [id number] Date of Birth: Aug 07, 1950   Medicare Important Message Given:  Yes     Juliann Pulse A Jeilani Grupe 04/04/2020, 11:20 AM

## 2020-04-04 NOTE — Progress Notes (Signed)
PROGRESS NOTE    Steven Ingram  WUJ:[id number] DOB: 09-01-1950 DOA: 03/31/2020 PCP: [facility] [employer]    Brief Narrative:  68 y.o. male with medical history significant of hypertension, hyperlipidemia, dCHF, diabetes mellitus, stroke, depression, CAD, s/p for right nephrectomy, CKD-3A, tobacco abuse, who presents with altered mental status.  Per his wife (I called his wife by phone), pt become confused this AM. He has has generalized weakness, could not get up normally. Wife states that pt wears denture which makes him difficult speaking normally.  Patient does not seem to have chest pain, cough, shortness of breath.  Patient had a diarrhea recently which has resolved per his wife, currently patient does not have nausea vomiting, diarrhea or abdominal pain.  Patient has bilateral lower leg edema. He received his 3rd dose of COVID-19 booster yesterday. He developed fever. His body temperature is 101.2 in ED  12/17: T-max over interval one1.3.  Started on vancomycin and cefepime.  Stable this morning.  Alert, oriented x2.  12/18: No fevers over interval.  Negative procalcitonin.  Antibiotics discontinued.  Therapy evaluated patient and found him to be significantly ataxic.   Assessment & Plan:   Principal Problem:   Acute metabolic encephalopathy Active Problems:   History of CVA (cerebrovascular accident)   Type 2 diabetes mellitus with stage 3 chronic kidney disease (HCC)   CAD (coronary artery disease)   HTN (hypertension)   SIRS (systemic inflammatory response syndrome) (HCC)   Acute renal failure superimposed on stage 3a chronic kidney disease (HCC)   HLD (hyperlipidemia)   Chronic diastolic CHF (congestive heart failure) (HCC)   Encephalopathy acute  Acute metabolic encephalopathy, improved Etiology is not clear.   CT head is negative for acute intracranial abnormalities.   No focal neuro deficit on physical examination.   Low suspicions for meningitis.    Likely multifactorial etiology, including fever, SIRS, worsening renal function.   No source of infection identified.   Patient has fever, which is likely due to COVID-19 vaccine booster shot. --does take sedatives Seroquel and Risperdal at home. Plan: --Monitor mental status and oral intake --Delirium precautions --cont home Seroquel and Risperdal since mental status is improving  SIRS (systemic inflammatory response syndrome)  Patient meets criteria for SIRS with tachycardia with heart rate of 106 and fever with body temperature 101.2.  Lactic acid is normal.   No source of infection identified.   Urinalysis negative chest x-ray negative Patient received 1 dose of Rocephin in ED Antibiotic coverage expanded to vancomycin and cefepime after fever 101.3 Suspect fever and SIRS due to vaccine Negative procalcitonin Low/no suspicion for infectious process, abx d/c'ed. --blood cx neg so far. Plan: --Hold abx  Ataxia Unclear etiology Patient has history of CVA is already on antiplatelet therapy.   --MRI brain without acute finding. Plan: --PT rec SNF rehab  History of CVA (cerebrovascular accident) --MRI brain without acute finding. --cont home statin and plavix  Type 2 diabetes mellitus with stage 3 chronic kidney disease ([place]) Hyperglycemia Patient is not taking medications currently.   --BG in 200's.  A1c 7.0. --add mealtime 3u TID --SSI   CAD (coronary artery disease) -Continue Lipitor  HTN (hypertension):  Blood pressure 178/95 on admission Plan: --increase dilt to 120 mg TID --cont home amlodipine --continue home Lisinopril   Intermittent tachycardia --tele noted HR in 160's on 12/18. --s/p IV metop 5 mg Plan: --increase dilt to 120 mg TID --d/c tele  Acute renal failure, ruled out stage 3a chronic kidney disease ([place]):  Baseline Cre is 1.32 on 07/19/19, pt's Cre is 1.67 and BUN 16 on admission.  Cr increase does not meet criteria for AKI  HLD  (hyperlipidemia):  -Lipitor  Chronic diastolic CHF (congestive heart failure) ([place])  2D echo on 07/20/2019 showed EF of 55 to 60% with grade 1 diastolic dysfunction.  Patient has had 1+ bilateral leg edema, but no respiratory distress.  Chest x-ray did not show pulmonary edema.  BNP 65.9.   Does not seem to have CHF exacerbation.   DVT prophylaxis: Lovenox Code Status: Full Family Communication: wife updated at bedside today Disposition Plan:Status is: Inpatient   Dispo: The patient is from: Home              Anticipated d/c is to: SNF              Anticipated d/c date is: when bed available, likely tomorrow              Patient currently is medically stable to d/c.     Consultants:   None  Procedures:   None  Antimicrobials:     Subjective: Pt reported he continues to feel better.  No pain.  No N/V/D.  No eating as much, per wife.   Objective: Vitals:   04/04/20 0030 04/04/20 0446 04/04/20 0753 04/04/20 1156  BP: (!) 145/73 (!) 160/80 (!) 162/83 (!) 141/78  Pulse: 87 86 83 91  Resp: 20 20 16 18   Temp: 98.2 F (36.8 C) (!) 97.5 F (36.4 C) 98.5 F (36.9 C) 97.8 F (36.6 C)  TempSrc: Oral Oral  Oral  SpO2: 100% 100% 100% 100%  Weight:      Height:        Intake/Output Summary (Last 24 hours) at 04/04/2020 1700 Last data filed at 04/04/2020 1406 Gross per 24 hour  Intake 480 ml  Output 1050 ml  Net -570 ml   Filed Weights   03/31/20 0924  Weight: 94 kg    Examination:  Constitutional: NAD, alert, oriented to self, sitting in chair HEENT: conjunctivae and lids normal, EOMI CV: No cyanosis.   RESP: normal respiratory effort, on RA Extremities: No effusions, edema in BLE SKIN: warm, dry and intact Neuro: II - XII grossly intact.   Psych: Normal mood and affect.     Data Reviewed: I have personally reviewed following labs and imaging studies  CBC: Recent Labs  Lab 03/31/20 1007 04/01/20 0520 04/03/20 1132 04/04/20 0319  WBC 4.1 3.3*  3.3* 3.7*  NEUTROABS 3.2  --   --   --   HGB 12.5* 12.2* 11.2* 11.2*  HCT 36.6* 34.5* 32.3* 31.9*  MCV 95.1 93.0 95.0 94.1  PLT 176 159 128* 124*   Basic Metabolic Panel: Recent Labs  Lab 03/31/20 1007 03/31/20 2039 04/01/20 0520 04/02/20 1936 04/03/20 1132 04/04/20 0319  NA 143  --  137 139 135 139  K 3.5  --  3.6 3.3* 3.5 3.2*  CL 98  --  106 107 106 107  CO2 24  --  21* 22 21* 23  GLUCOSE 210*  --  185* 176* 245* 176*  BUN 16  --  15 17 18 19   CREATININE 1.67*  --  1.61* 1.53* 1.49* 1.50*  CALCIUM 8.9  --  8.2* 8.2* 8.1* 8.3*  MG  --  1.7  --  2.0 1.9 2.0   GFR: Estimated Creatinine Clearance: 51.9 mL/min (A) (by C-G formula based on SCr of 1.5 mg/dL (H)). Liver Function Tests: Recent  Labs  Lab 03/31/20 1007  AST 17  ALT 15  ALKPHOS 50  BILITOT 1.3*  PROT 6.4*  ALBUMIN 3.3*   No results for input(s): LIPASE, AMYLASE in the last 168 hours. Recent Labs  Lab 03/31/20 1007  AMMONIA <9*   Coagulation Profile: Recent Labs  Lab 03/31/20 1007  INR 1.0   Cardiac Enzymes: No results for input(s): CKTOTAL, CKMB, CKMBINDEX, TROPONINI in the last 168 hours. BNP (last 3 results) No results for input(s): PROBNP in the last 8760 hours. HbA1C: Recent Labs    04/03/20 1132  HGBA1C 7.0*   CBG: Recent Labs  Lab 04/03/20 1139 04/03/20 1632 04/03/20 2035 04/04/20 0755 04/04/20 1157  GLUCAP 214* 233* 159* 150* 311*   Lipid Profile: No results for input(s): CHOL, HDL, LDLCALC, TRIG, CHOLHDL, LDLDIRECT in the last 72 hours. Thyroid Function Tests: No results for input(s): TSH, T4TOTAL, FREET4, T3FREE, THYROIDAB in the last 72 hours. Anemia Panel: No results for input(s): VITAMINB12, FOLATE, FERRITIN, TIBC, IRON, RETICCTPCT in the last 72 hours. Sepsis Labs: Recent Labs  Lab 03/31/20 1007 03/31/20 1340  PROCALCITON  --  <0.10  LATICACIDVEN 1.6  --     Recent Results (from the past 240 hour(s))  Resp Panel by RT-PCR (Flu A&B, Covid) Nasopharyngeal Swab      Status: None   Collection Time: 03/31/20 10:07 AM   Specimen: Nasopharyngeal Swab; Nasopharyngeal(NP) swabs in vial transport medium  Result Value Ref Range Status   SARS Coronavirus 2 by RT PCR NEGATIVE NEGATIVE Final    Comment: (NOTE) SARS-CoV-2 target nucleic acids are NOT DETECTED.  The SARS-CoV-2 RNA is generally detectable in upper respiratory specimens during the acute phase of infection. The lowest concentration of SARS-CoV-2 viral copies this assay can detect is 138 copies/mL. A negative result does not preclude SARS-Cov-2 infection and should not be used as the sole basis for treatment or other patient management decisions. A negative result may occur with  improper specimen collection/handling, submission of specimen other than nasopharyngeal swab, presence of viral mutation(s) within the areas targeted by this assay, and inadequate number of viral copies(<138 copies/mL). A negative result must be combined with clinical observations, patient history, and epidemiological information. The expected result is Negative.  Fact Sheet for Patients:  EntrepreneurPulse.com.au  Fact Sheet for Healthcare Providers:  IncredibleEmployment.be  This test is no t yet approved or cleared by the [place] FDA and  has been authorized for detection and/or diagnosis of SARS-CoV-2 by FDA under an Emergency Use Authorization (EUA). This EUA will remain  in effect (meaning this test can be used) for the duration of the COVID-19 declaration under Section 564(b)(1) of the Act, 21 U.S.C.section 360bbb-3(b)(1), unless the authorization is terminated  or revoked sooner.       Influenza A by PCR NEGATIVE NEGATIVE Final   Influenza B by PCR NEGATIVE NEGATIVE Final    Comment: (NOTE) The Xpert Xpress SARS-CoV-2/FLU/RSV plus assay is intended as an aid in the diagnosis of influenza from Nasopharyngeal swab specimens and should not be used as a sole basis  for treatment. Nasal washings and aspirates are unacceptable for Xpert Xpress SARS-CoV-2/FLU/RSV testing.  Fact Sheet for Patients: EntrepreneurPulse.com.au  Fact Sheet for Healthcare Providers: IncredibleEmployment.be  This test is not yet approved or cleared by the [place] FDA and has been authorized for detection and/or diagnosis of SARS-CoV-2 by FDA under an Emergency Use Authorization (EUA). This EUA will remain in effect (meaning this test can be used) for the duration of  the COVID-19 declaration under Section 564(b)(1) of the Act, 21 U.S.C. section 360bbb-3(b)(1), unless the authorization is terminated or revoked.  Performed at [facility], [address] [place], [place] [address]   Culture, blood (Routine X 2) w Reflex to ID Panel     Status: None (Preliminary result)   Collection Time: 03/31/20 10:07 AM   Specimen: BLOOD LEFT ARM  Result Value Ref Range Status   Specimen Description BLOOD LEFT ARM  Final   Special Requests   Final    BOTTLES DRAWN AEROBIC AND ANAEROBIC Blood Culture results may not be optimal due to an inadequate volume of blood received in culture bottles   Culture   Final    NO GROWTH 3 DAYS Performed at [facility], [address] [place], [place] [address]    Report Status PENDING  Incomplete  Culture, blood (Routine X 2) w Reflex to ID Panel     Status: None (Preliminary result)   Collection Time: 03/31/20 10:07 AM   Specimen: BLOOD RIGHT HAND  Result Value Ref Range Status   Specimen Description BLOOD RIGHT HAND  Final   Special Requests   Final    BOTTLES DRAWN AEROBIC AND ANAEROBIC Blood Culture results may not be optimal due to an inadequate volume of blood received in culture bottles   Culture   Final    NO GROWTH 3 DAYS Performed at [facility], [address], [place], [place] [address]    Report Status PENDING  Incomplete  Urine Culture     Status: None    Collection Time: 03/31/20 10:07 AM   Specimen: Urine, Random  Result Value Ref Range Status   Specimen Description   Final    URINE, RANDOM Performed at [facility], [address], [place], [place] [address]    Special Requests   Final    NONE Performed at [facility], [address], [place], [place] [address]    Culture   Final    NO GROWTH Performed at [facility], [place] [address], [place],  [address]    Report Status 04/01/2020 FINAL  Final         Radiology Studies: ECHOCARDIOGRAM COMPLETE  Result Date: 04/03/2020    ECHOCARDIOGRAM REPORT   Patient Name:   Steven Ingram Date of Exam: 04/03/2020 Medical Rec #:  [id number]       Height:       69.0 in Accession #:    [id number]      Weight:       207.2 lb Date of Birth:  Aug 22, 1950        BSA:          2.097 m Patient Age:    79 years        BP:           168/84 mmHg Patient Gender: M               HR:           83 bpm. Exam Location:  [facility] Procedure: 2D Echo Indications:     Atrial Fibrillation  History:         Patient has prior history of Echocardiogram examinations. CHF,                  Stroke; Risk Factors:Hypertension and Current Smoker.  Sonographer:     L Thornton-Maynard Referring Phys:  [id number] Sidney Ace Diagnosing Phys: Neoma Laming MD IMPRESSIONS  1. Left ventricular ejection fraction, by estimation, is 55 to 60%. The left ventricle has normal function. The left ventricle has no regional wall motion abnormalities. Left ventricular diastolic parameters are consistent with Grade I diastolic dysfunction (impaired relaxation).  2. Right ventricular systolic function is normal. The right ventricular size is normal. There is normal pulmonary artery systolic pressure.  3. Left atrial size was mildly dilated.  4. Right atrial size was mildly dilated.  5. The mitral valve is normal in structure. Mild mitral valve regurgitation. No evidence of mitral stenosis.  6. The aortic valve is normal  in structure. Aortic valve regurgitation is mild to moderate. No aortic stenosis is present.  7. The inferior vena cava is normal in size with greater than 50% respiratory variability, suggesting right atrial pressure of 3 mmHg. Conclusion(s)/Recommendation(s): No intracardiac source of embolism detected on this transthoracic study. A transesophageal echocardiogram is recommended to exclude cardiac source of embolism if clinically indicated. FINDINGS  Left Ventricle: Left ventricular ejection fraction, by estimation, is 55 to 60%. The left ventricle has normal function. The left ventricle has no regional wall motion abnormalities. The left ventricular internal cavity size was normal in size. There is  no left ventricular hypertrophy. Left ventricular diastolic parameters are consistent with Grade I diastolic dysfunction (impaired relaxation). Right Ventricle: The right ventricular size is normal. No increase in right ventricular wall thickness. Right ventricular systolic function is normal. There is normal pulmonary artery systolic pressure. The tricuspid regurgitant velocity is 1.49 m/s, and  with an assumed right atrial pressure of 3 mmHg, the estimated right ventricular systolic pressure is 50.0 mmHg. Left Atrium: Left atrial size was mildly dilated. Right Atrium: Right atrial size was mildly dilated. Pericardium: There is no evidence of pericardial effusion. Mitral Valve: The mitral valve is normal in structure. Mild mitral valve regurgitation. No evidence of mitral valve stenosis. Tricuspid Valve: The tricuspid valve is normal in structure. Tricuspid valve regurgitation is trivial. No evidence of tricuspid stenosis. Aortic Valve: The aortic valve is normal in structure. Aortic valve regurgitation is mild to moderate. No aortic stenosis is present. Aortic valve mean gradient measures 5.0 mmHg. Aortic valve peak gradient measures 8.0 mmHg. Aortic valve area, by VTI measures 2.64 cm. Pulmonic Valve: The pulmonic  valve was normal in structure. Pulmonic valve regurgitation is not visualized. No evidence of pulmonic stenosis. Aorta: The aortic root is normal in size and structure. Venous: The inferior vena cava is normal in size with greater than 50% respiratory variability, suggesting right atrial pressure of 3 mmHg. IAS/Shunts: No atrial level shunt detected by color flow Doppler.  LEFT VENTRICLE PLAX 2D LVIDd:         4.14 cm  Diastology LVIDs:         3.07 cm  LV e' medial:    5.43 cm/s LV PW:         0.98 cm  LV E/e' medial:  16.0 LV IVS:        1.18 cm  LV e' lateral:   5.59 cm/s LVOT diam:     2.30 cm  LV E/e' lateral: 15.6 LV SV:         76 LV SV Index:   36 LVOT Area:     4.15 cm  RIGHT VENTRICLE RV S prime:     13.20 cm/s TAPSE (M-mode): 1.9 cm LEFT ATRIUM             Index LA diam:        2.80  cm 1.33 cm/m LA Vol (A2C):   46.5 ml 22.17 ml/m LA Vol (A4C):   41.3 ml 19.69 ml/m LA Biplane Vol: 45.6 ml 21.74 ml/m  AORTIC VALVE                    PULMONIC VALVE AV Area (Vmax):    2.80 cm     PV Vmax:       1.11 m/s AV Area (Vmean):   2.70 cm     PV Peak grad:  4.9 mmHg AV Area (VTI):     2.64 cm AV Vmax:           141.00 cm/s AV Vmean:          104.000 cm/s AV VTI:            0.288 m AV Peak Grad:      8.0 mmHg AV Mean Grad:      5.0 mmHg LVOT Vmax:         95.00 cm/s LVOT Vmean:        67.600 cm/s LVOT VTI:          0.183 m LVOT/AV VTI ratio: 0.64  AORTA Ao Root diam: 3.65 cm MITRAL VALVE                TRICUSPID VALVE MV Area (PHT): 2.65 cm     TR Peak grad:   8.9 mmHg MV E velocity: 87.00 cm/s   TR Vmax:        149.00 cm/s MV A velocity: 130.00 cm/s MV E/A ratio:  0.67         SHUNTS                             Systemic VTI:  0.18 m                             Systemic Diam: 2.30 cm Neoma Laming MD Electronically signed by Neoma Laming MD Signature Date/Time: 04/03/2020/10:02:09 PM    Final         Scheduled Meds: . amLODipine  10 mg Oral Daily  . atorvastatin  80 mg Oral Daily  . clopidogrel  75 mg  Oral Daily  . diltiazem  120 mg Oral TID  . enoxaparin (LOVENOX) injection  47.5 mg Subcutaneous Q24H  . insulin aspart  0-5 Units Subcutaneous QHS  . insulin aspart  0-9 Units Subcutaneous TID WC  . lisinopril  40 mg Oral Daily  . melatonin  5 mg Oral QHS  . nicotine  21 mg Transdermal Daily  . potassium chloride  40 mEq Oral Once  . QUEtiapine  50 mg Oral QHS  . risperiDONE  1 mg Oral QHS  . sertraline  25 mg Oral Daily  . tamsulosin  0.4 mg Oral Daily   Continuous Infusions:    LOS: 3 days    Enzo Bi, MD [facility] Pager [phone]  If 7PM-7AM, please contact night-coverage 04/04/2020, 5:00 PM

## 2020-04-04 NOTE — NC FL2 (Signed)
[place] LEVEL OF CARE SCREENING TOOL     IDENTIFICATION  Patient Name: Steven Ingram Birthdate: 1950/10/01 Sex: male Admission Date (Current Location): 03/31/2020  [place] and [place] Number:  Engineering geologist and Address:  [facility], [address], [place], [place] [address]      Provider Number: [id number]  Attending Physician Name and Address:  Enzo Bi, MD  Relative Name and Phone Number:  Spouse Wyett Narine - [phone]    Current Level of Care: Hospital Recommended Level of Care: [place] Prior Approval Number:    Date Approved/Denied:   PASRR Number: [id number]  Discharge Plan: SNF    Current Diagnoses: Patient Active Problem List   Diagnosis Date Noted  . Encephalopathy acute 04/01/2020  . Acute metabolic encephalopathy 29/51/8841  . SIRS (systemic inflammatory response syndrome) ([place]) 03/31/2020  . Acute renal failure superimposed on stage 3a chronic kidney disease ([place]) 03/31/2020  . HLD (hyperlipidemia) 03/31/2020  . Chronic diastolic CHF (congestive heart failure) ([place]) 03/31/2020  . Malnutrition of moderate degree 07/20/2019  . Dizziness 07/19/2019  . Unsteady gait when walking 07/19/2019  . History of CVA (cerebrovascular accident) 07/19/2019  . Type 2 diabetes mellitus with stage 3 chronic kidney disease ([place]) 07/19/2019  . CAD (coronary artery disease) 07/19/2019  . HTN (hypertension) 07/19/2019  . H/O right nephrectomy 07/19/2019  . 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 CVA (cerebral vascular accident) ([place]) 10/03/2018    Orientation RESPIRATION BLADDER Height & Weight     Self  Normal Incontinent,External catheter Weight: 207 lb 3.7 oz (94 kg) Height:  5\' 9"  (175.3 cm)  BEHAVIORAL SYMPTOMS/MOOD NEUROLOGICAL BOWEL NUTRITION STATUS      Incontinent Diet (dysphagia 3)  AMBULATORY STATUS COMMUNICATION OF NEEDS Skin   Limited Assist Verbally Normal                        Personal Care Assistance Level of Assistance  Bathing,Feeding,Dressing Bathing Assistance: Maximum assistance Feeding assistance: Limited assistance Dressing Assistance: Maximum assistance     Functional Limitations Info             SPECIAL CARE FACTORS FREQUENCY  PT (By licensed PT),OT (By licensed OT)     PT Frequency: 5 x/week OT Frequency: 5 x/week            Contractures Contractures Info: Not present    Additional Factors Info  Allergies,Code Status Code Status Info: full code Allergies Info: nka Psychotropic Info: See medication list Insulin Sliding Scale Info: See medication list       Current Medications (04/04/2020):  This is the current hospital active medication list Current Facility-Administered Medications  Medication Dose Route Frequency Provider Last Rate Last Admin  . acetaminophen (TYLENOL) suppository 650 mg  650 mg Rectal Q6H PRN Ivor Costa, MD      . acetaminophen (TYLENOL) tablet 650 mg  650 mg Oral Q6H PRN Ivor Costa, MD   650 mg at 04/01/20 0911  . amLODipine (NORVASC) tablet 10 mg  10 mg Oral Daily Ivor Costa, MD   10 mg at 04/04/20 6606  . atorvastatin (LIPITOR) tablet 80 mg  80 mg Oral Daily Ivor Costa, MD   80 mg at 04/03/20 2147  . clopidogrel (PLAVIX) tablet 75 mg  75 mg Oral Daily Ivor Costa, MD   75 mg at 04/04/20 0827  . diltiazem (CARDIZEM) tablet 120 mg  120 mg Oral TID Enzo Bi, MD      .  enoxaparin (LOVENOX) injection 47.5 mg  47.5 mg Subcutaneous Q24H Ivor Costa, MD   47.5 mg at 04/03/20 1515  . hydrALAZINE (APRESOLINE) injection 5 mg  5 mg Intravenous Q2H PRN Ivor Costa, MD   5 mg at 04/03/20 0827  . insulin aspart (novoLOG) injection 0-5 Units  0-5 Units Subcutaneous QHS Ivor Costa, MD   2 Units at 04/01/20 2114  . insulin aspart (novoLOG) injection 0-9 Units  0-9 Units Subcutaneous TID WC Ivor Costa, MD   1 Units at 04/04/20 0831  . lisinopril (ZESTRIL) tablet 40 mg  40 mg Oral Daily Enzo Bi, MD   40 mg at 04/04/20 0826   . melatonin tablet 5 mg  5 mg Oral QHS Sharion Settler, NP   5 mg at 04/03/20 2147  . metoprolol tartrate (LOPRESSOR) injection 5 mg  5 mg Intravenous Q5 min PRN Ralene Muskrat B, MD   5 mg at 04/02/20 1600  . nicotine (NICODERM CQ - dosed in mg/24 hours) patch 21 mg  21 mg Transdermal Daily Ivor Costa, MD   21 mg at 04/04/20 0829  . ondansetron (ZOFRAN) injection 4 mg  4 mg Intravenous Q8H PRN Ivor Costa, MD      . potassium chloride SA (KLOR-CON) CR tablet 40 mEq  40 mEq Oral Once Enzo Bi, MD      . QUEtiapine (SEROQUEL) tablet 50 mg  50 mg Oral QHS Ivor Costa, MD   50 mg at 04/03/20 2147  . risperiDONE (RISPERDAL) tablet 1 mg  1 mg Oral QHS Ivor Costa, MD   1 mg at 04/03/20 2147  . sertraline (ZOLOFT) tablet 25 mg  25 mg Oral Daily Ivor Costa, MD   25 mg at 04/04/20 1638  . tamsulosin (FLOMAX) capsule 0.4 mg  0.4 mg Oral Daily Ivor Costa, MD   0.4 mg at 04/04/20 4665     Discharge Medications: Please see discharge summary for a list of discharge medications.  Relevant Imaging Results:  Relevant Lab Results:   Additional Information SS #: [id number]  [place], LCSW

## 2020-04-04 NOTE — Progress Notes (Signed)
Inpatient Diabetes Program Recommendations  AACE/ADA: New Consensus Statement on Inpatient Glycemic Control (2015)  Target Ranges:  Prepandial:   less than 140 mg/dL      [facility] postprandial:   less than 180 mg/dL (1-2 hours)      Critically ill patients:  140 - 180 mg/dL   Results for Steven Ingram, Steven Ingram (MRN [id number]) as of 04/04/2020 12:45  Ref. Range 04/03/2020 07:28 04/03/2020 11:39 04/03/2020 16:32 04/03/2020 20:35  Glucose-Capillary Latest Ref Range: 70 - 99 mg/dL 192 (H)  2 units NOVOLOG  214 (H)  3 units NOVOLOG  233 (H)  3 units NOVOLOG  159 (H)   Results for Steven Ingram, Steven Ingram (MRN [id number]) as of 04/04/2020 12:45  Ref. Range 04/04/2020 07:55 04/04/2020 11:57  Glucose-Capillary Latest Ref Range: 70 - 99 mg/dL 150 (H)  1 unit NOVOLOG  311 (H)     Home DM Meds: None listed   Current Orders: Novolog 0-9 units ac/hs    MD- Pt having significant CBG elevations after meals  Please consider adding Novolog Meal Coverage:  Novolog 3 units TID with meals to start  (Please add the following Hold Parameters: Hold if pt eats <50% of meal, Hold if pt NPO)    --Will follow patient during hospitalization--  Wyn Quaker RN, MSN, CDE Diabetes Coordinator Inpatient Glycemic Control Team Team Pager: [phone] (8a-5p)

## 2020-04-04 NOTE — TOC Initial Note (Addendum)
Transition of Care [facility]) - Initial/Assessment Note    Patient Details  Name: Steven Ingram MRN: [id number] Date of Birth: 1950-09-11  [facility]) CM/SW Contact:    Magnus Ivan, LCSW Phone Number: 04/04/2020, 10:28 AM  Clinical Narrative:                Per chart review patient is disoriented. CSW called and spoke with patient's daughter Crystal. Crystal reported at baseline patient lives with his wife [name] who transports him to appointments. PCP is [facility]. Crystal was unsure of which Pharmacy patient uses. No DME, HH, or SNF history. Crystal reported patient has had both COVID vaccines and recently had a booster vaccine as well. CSW explained PT recommending SNF rehab. Crystal reported they are agreeable to this recommendation and would prefer somewhere close to [place].   CSW started SNF work up and sent to local SNFs. Called Ricky at [facility] in [place], left voicemail asked him to review referral.   [place] to begin insurance auth. Spoke to [employer] and started auth for SNF rehab. Reference ID #: [id number].  2:55- CSW called patient's daughter to present bed offers at [facility] and [facility]. She asked CSW to review these with patient's wife as well. Called patient's wife [name]. Left a voicemail.  3:45- Spoke to patient's wife [name] and presented bed offers. She reported she does not want patient's daughter contacted anymore, daughter was previously listed as patient's main contact earlier today. [name] reported she is aware of this but wants it changed to her name. Confirmed [name] is now listed as the 1st contact. [name] chose [facility] in [place] called [facility] and spoke with Bo Mcclintock, informed her of SNF choice. She reported patient has been approved for SNF rehab Auth ID # [id number], next review date 12/22. Updated Chris at [facility] or bed choice, auth, and asked when he will have  a bed available. Gerald Stabs reported they will have a bed for patient tomorrow morning. Updated MD and RN, asked for rapid COVID so that patient can DC in the morning.   Expected Discharge Plan: [place]     Patient Goals and CMS Choice Patient states their goals for this hospitalization and ongoing recovery are:: SNF rehab CMS Medicare.gov Compare Post Acute Care list provided to:: Patient Represenative (must comment) Choice offered to / list presented to : Adult Children  Expected Discharge Plan and Services Expected Discharge Plan: [place] arrangements for the past 2 months: Single Family Home                                      Prior Living Arrangements/Services Living arrangements for the past 2 months: Single Family Home Lives with:: Spouse Patient language and need for interpreter reviewed:: Yes Do you feel safe going back to the place where you live?: Yes      Need for Family Participation in Patient Care: Yes (Comment) Care giver support system in place?: Yes (comment)   Criminal Activity/Legal Involvement Pertinent to Current Situation/Hospitalization: No - Comment as needed  Activities of Daily Living Home Assistive Devices/Equipment: None ADL Screening (condition at time of admission) Patient's cognitive ability adequate to safely complete daily activities?: No Is the patient deaf or have difficulty hearing?: No Does the patient have difficulty seeing, even when  wearing glasses/contacts?: No Does the patient have difficulty concentrating, remembering, or making decisions?: No Patient able to express need for assistance with ADLs?: Yes Does the patient have difficulty dressing or bathing?: No Independently performs ADLs?: Yes (appropriate for developmental age) Does the patient have difficulty walking or climbing stairs?: No Weakness of Legs: None Weakness of Arms/Hands: None  Permission  Sought/Granted Permission sought to share information with : Facility Art therapist granted to share information with : Yes, Verbal Permission Granted (by daughter)     Permission granted to share info w AGENCY: SNFs        Emotional Assessment       Orientation: : Fluctuating Orientation (Suspected and/or reported Sundowners) Alcohol / Substance Use: Not Applicable Psych Involvement: No (comment)  Admission diagnosis:  SIRS (systemic inflammatory response syndrome) ([place]) [W86.16] Acute metabolic encephalopathy [O37.29] Encephalopathy acute [G93.40] Patient Active Problem List   Diagnosis Date Noted  . Encephalopathy acute 04/01/2020  . Acute metabolic encephalopathy 05/27/1550  . SIRS (systemic inflammatory response syndrome) ([place]) 03/31/2020  . Acute renal failure superimposed on stage 3a chronic kidney disease ([place]) 03/31/2020  . HLD (hyperlipidemia) 03/31/2020  . Chronic diastolic CHF (congestive heart failure) ([place]) 03/31/2020  . Malnutrition of moderate degree 07/20/2019  . Dizziness 07/19/2019  . Unsteady gait when walking 07/19/2019  . History of CVA (cerebrovascular accident) 07/19/2019  . Type 2 diabetes mellitus with stage 3 chronic kidney disease ([place]) 07/19/2019  . CAD (coronary artery disease) 07/19/2019  . HTN (hypertension) 07/19/2019  . H/O right nephrectomy 07/19/2019  . Left pontine stroke ([place]) 07/19/2019  . CVA (cerebral vascular accident) ([place]) 10/03/2018   PCP:  [facility], [place]:   [id number] [place], [place] [address] Phone: [phone] Fax: [phone]     Social Determinants of Health (SDOH) Interventions    Readmission Risk Interventions Readmission Risk Prevention Plan 10/04/2018  Post Dischage Appt Not Complete  Medication Screening Complete  Transportation Screening Complete

## 2020-04-05 LAB — GLUCOSE, CAPILLARY
Glucose-Capillary: 150 mg/dL — ABNORMAL HIGH (ref 70–99)
Glucose-Capillary: 247 mg/dL — ABNORMAL HIGH (ref 70–99)
Glucose-Capillary: 220 mg/dL — ABNORMAL HIGH (ref 70–99)
Glucose-Capillary: 174 mg/dL — ABNORMAL HIGH (ref 70–99)

## 2020-04-05 LAB — CULTURE, BLOOD (ROUTINE X 2)
Culture: NO GROWTH
Culture: NO GROWTH

## 2020-04-05 LAB — CBC
HCT: 31.1 % — ABNORMAL LOW (ref 39.0–52.0)
Hemoglobin: 10.6 g/dL — ABNORMAL LOW (ref 13.0–17.0)
MCH: 32.3 pg (ref 26.0–34.0)
MCHC: 34.1 g/dL (ref 30.0–36.0)
MCV: 94.8 fL (ref 80.0–100.0)
Platelets: 140 10*3/uL — ABNORMAL LOW (ref 150–400)
RBC: 3.28 MIL/uL — ABNORMAL LOW (ref 4.22–5.81)
RDW: 11.3 % — ABNORMAL LOW (ref 11.5–15.5)
WBC: 3.9 10*3/uL — ABNORMAL LOW (ref 4.0–10.5)
nRBC: 0 % (ref 0.0–0.2)

## 2020-04-05 LAB — BASIC METABOLIC PANEL
Anion gap: 9 (ref 5–15)
BUN: 23 mg/dL (ref 8–23)
CO2: 22 mmol/L (ref 22–32)
Calcium: 8.6 mg/dL — ABNORMAL LOW (ref 8.9–10.3)
Chloride: 108 mmol/L (ref 98–111)
Creatinine, Ser: 1.85 mg/dL — ABNORMAL HIGH (ref 0.61–1.24)
GFR, Estimated: 39 mL/min — ABNORMAL LOW (ref >60)
Glucose, Bld: 155 mg/dL — ABNORMAL HIGH (ref 70–99)
Potassium: 3.4 mmol/L — ABNORMAL LOW (ref 3.5–5.1)
Sodium: 139 mmol/L (ref 135–145)

## 2020-04-05 LAB — MAGNESIUM: Magnesium: 2.1 mg/dL (ref 1.7–2.4)

## 2020-04-05 LAB — SAR COV2 SEROLOGY (COVID19)AB(IGG),IA: SARS-CoV-2 Ab, IgG: NONREACTIVE

## 2020-04-05 MED ORDER — SODIUM CHLORIDE 0.9 % IV SOLN: INTRAVENOUS | Status: DC

## 2020-04-05 MED ORDER — POLYETHYLENE GLYCOL 3350 17 G PO PACK
34.0000 g | PACK | ORAL | Status: DC
  Administered 2020-04-05: 22:00:00 34 g via ORAL
  Filled 2020-04-05: qty 2

## 2020-04-05 MED ORDER — POTASSIUM CHLORIDE CRYS ER 20 MEQ PO TBCR
40.0000 meq | EXTENDED_RELEASE_TABLET | Freq: Once | ORAL | Status: AC
  Administered 2020-04-05: 11:00:00 40 meq via ORAL
  Filled 2020-04-05: qty 2

## 2020-04-05 MED ORDER — METOPROLOL TARTRATE 25 MG PO TABS
25.0000 mg | ORAL_TABLET | Freq: Two times a day (BID) | ORAL | Status: DC
  Administered 2020-04-05 – 2020-04-06 (×3): 25 mg via ORAL
  Filled 2020-04-05 (×3): qty 1

## 2020-04-05 NOTE — Progress Notes (Signed)
PROGRESS NOTE    Steven Ingram  GLO:[id number] DOB: 17-Oct-1950 DOA: 03/31/2020 PCP: [facility]    Brief Narrative:  69 y.o. male with medical history significant of hypertension, hyperlipidemia, dCHF, diabetes mellitus, stroke, depression, CAD, s/p for right nephrectomy, CKD-3A, tobacco abuse, who presents with altered mental status.  Per his wife (I called his wife by phone), pt become confused this AM. He has has generalized weakness, could not get up normally. Wife states that pt wears denture which makes him difficult speaking normally.  Patient does not seem to have chest pain, cough, shortness of breath.  Patient had a diarrhea recently which has resolved per his wife, currently patient does not have nausea vomiting, diarrhea or abdominal pain.  Patient has bilateral lower leg edema. He received his 3rd dose of COVID-19 booster yesterday. He developed fever. His body temperature is 101.2 in ED  12/17: T-max over interval one1.3.  Started on vancomycin and cefepime.  Stable this morning.  Alert, oriented x2.  12/18: No fevers over interval.  Negative procalcitonin.  Antibiotics discontinued.  Therapy evaluated patient and found him to be significantly ataxic.   Assessment & Plan:   Principal Problem:   Acute metabolic encephalopathy Active Problems:   History of CVA (cerebrovascular accident)   Type 2 diabetes mellitus with stage 3 chronic kidney disease (HCC)   CAD (coronary artery disease)   HTN (hypertension)   SIRS (systemic inflammatory response syndrome) (HCC)   Acute renal failure superimposed on stage 3a chronic kidney disease (HCC)   HLD (hyperlipidemia)   Chronic diastolic CHF (congestive heart failure) (HCC)   Encephalopathy acute  Acute metabolic encephalopathy, improved Etiology is not clear.   CT head is negative for acute intracranial abnormalities.   No focal neuro deficit on physical examination.   Low suspicions for meningitis.    Likely multifactorial etiology, including fever, SIRS, worsening renal function.   No source of infection identified.   Patient has fever, which is likely due to COVID-19 vaccine booster shot. --does take sedatives Seroquel and Risperdal at home. Plan: --Monitor mental status and oral intake --Delirium precautions --cont home Seroquel and Risperdal since mental status is improving  SIRS (systemic inflammatory response syndrome)  Patient meets criteria for SIRS with tachycardia with heart rate of 106 and fever with body temperature 101.2.  Lactic acid is normal.   No source of infection identified.   Urinalysis negative chest x-ray negative Patient received 1 dose of Rocephin in ED Antibiotic coverage expanded to vancomycin and cefepime after fever 101.3 Suspect fever and SIRS due to vaccine Negative procalcitonin Low/no suspicion for infectious process, abx d/c'ed. --blood cx neg so far. Plan: --Hold abx  Ataxia Unclear etiology Patient has history of CVA is already on antiplatelet therapy.   --MRI brain without acute finding. Plan: --PT rec SNF rehab  History of CVA (cerebrovascular accident) --MRI brain without acute finding. --cont home statin and plavix  Type 2 diabetes mellitus with stage 3 chronic kidney disease ([place]) Hyperglycemia Patient is not taking medications currently.   --BG in 200's.  A1c 7.0. --cont mealtime 3u TID --SSI   CAD (coronary artery disease) -Continue Lipitor  HTN (hypertension):  Blood pressure 178/95 on admission Plan: --cont dilt 120 mg TID --cont home amlodipine --continue home Lisinopril   Intermittent tachycardia --tele noted HR in 160's on 12/18. --s/p IV metop 5 mg Plan: --cont dilt 120 mg TID  AKI, not POA stage 3a chronic kidney disease ([place]):  Baseline Cre is 1.32 on  07/19/19, pt's Cre is 1.67 and BUN 16 on admission.   --Cr trended up to 1.85 this morning, from 1.5.  Maybe due to poor hydration. --NS@100  for 10  hours  HLD (hyperlipidemia):  -Lipitor  Chronic diastolic CHF (congestive heart failure) ([place])  2D echo on 07/20/2019 showed EF of 55 to 60% with grade 1 diastolic dysfunction.  Patient has had 1+ bilateral leg edema, but no respiratory distress.  Chest x-ray did not show pulmonary edema.  BNP 65.9.   Does not seem to have CHF exacerbation.  Hypokalemia --pt has required almost daily potassium supplement --consider discharge on short course of potassium supplement   DVT prophylaxis: Lovenox Code Status: Full Family Communication:  Disposition Plan:Status is: Inpatient   Dispo: The patient is from: Home              Anticipated d/c is to: SNF              Anticipated d/c date is: tomorrow if Cr improving.                Consultants:   None  Procedures:   None  Antimicrobials:     Subjective: Nursing reported pt fell off chair last night. Sitter ordered.   This morning, pt said he remembered falling off chair, didn't heat his head.  No N/V/D.      Objective: Vitals:   04/05/20 0850 04/05/20 1154 04/05/20 1631 04/05/20 1633  BP: (!) 140/124 130/71 128/67 110/69  Pulse: 79 73 70 73  Resp: 16 16 16    Temp: 97.6 F (36.4 C) 97.8 F (36.6 C) 97.8 F (36.6 C)   TempSrc: Oral     SpO2: 100% 100% 100% 100%  Weight:      Height:        Intake/Output Summary (Last 24 hours) at 04/05/2020 1759 Last data filed at 04/05/2020 1742 Gross per 24 hour  Intake 857.96 ml  Output --  Net 857.96 ml   Filed Weights   03/31/20 0924  Weight: 94 kg    Examination:  Constitutional: NAD, alert, oriented to self HEENT: conjunctivae and lids normal, EOMI CV: No cyanosis.   RESP: normal respiratory effort, on RA Extremities: No effusions, edema in BLE SKIN: warm, dry and intact Neuro: II - XII grossly intact.   Psych: Normal mood and affect.     Data Reviewed: I have personally reviewed following labs and imaging studies  CBC: Recent Labs  Lab 03/31/20 1007  04/01/20 0520 04/03/20 1132 04/04/20 0319 04/05/20 0528  WBC 4.1 3.3* 3.3* 3.7* 3.9*  NEUTROABS 3.2  --   --   --   --   HGB 12.5* 12.2* 11.2* 11.2* 10.6*  HCT 36.6* 34.5* 32.3* 31.9* 31.1*  MCV 95.1 93.0 95.0 94.1 94.8  PLT 176 159 128* 127* 935*   Basic Metabolic Panel: Recent Labs  Lab 03/31/20 2039 04/01/20 0520 04/02/20 1936 04/03/20 1132 04/04/20 0319 04/05/20 0528  NA  --  137 139 135 139 139  K  --  3.6 3.3* 3.5 3.2* 3.4*  CL  --  106 107 106 107 108  CO2  --  21* 22 21* 23 22  GLUCOSE  --  185* 176* 245* 176* 155*  BUN  --  15 17 18 19 23   CREATININE  --  1.61* 1.53* 1.49* 1.50* 1.85*  CALCIUM  --  8.2* 8.2* 8.1* 8.3* 8.6*  MG 1.7  --  2.0 1.9 2.0 2.1   GFR: Estimated Creatinine  Clearance: 42 mL/min (A) (by C-G formula based on SCr of 1.85 mg/dL (H)). Liver Function Tests: Recent Labs  Lab 03/31/20 1007  AST 17  ALT 15  ALKPHOS 50  BILITOT 1.3*  PROT 6.4*  ALBUMIN 3.3*   No results for input(s): LIPASE, AMYLASE in the last 168 hours. Recent Labs  Lab 03/31/20 1007  AMMONIA <9*   Coagulation Profile: Recent Labs  Lab 03/31/20 1007  INR 1.0   Cardiac Enzymes: No results for input(s): CKTOTAL, CKMB, CKMBINDEX, TROPONINI in the last 168 hours. BNP (last 3 results) No results for input(s): PROBNP in the last 8760 hours. HbA1C: Recent Labs    04/03/20 1132  HGBA1C 7.0*   CBG: Recent Labs  Lab 04/04/20 1157 04/04/20 1811 04/05/20 0852 04/05/20 1155 04/05/20 1632  GLUCAP 311* 301* 150* 247* 220*   Lipid Profile: No results for input(s): CHOL, HDL, LDLCALC, TRIG, CHOLHDL, LDLDIRECT in the last 72 hours. Thyroid Function Tests: No results for input(s): TSH, T4TOTAL, FREET4, T3FREE, THYROIDAB in the last 72 hours. Anemia Panel: No results for input(s): VITAMINB12, FOLATE, FERRITIN, TIBC, IRON, RETICCTPCT in the last 72 hours. Sepsis Labs: Recent Labs  Lab 03/31/20 1007 03/31/20 1340  PROCALCITON  --  <0.10  LATICACIDVEN 1.6  --      Recent Results (from the past 240 hour(s))  Resp Panel by RT-PCR (Flu A&B, Covid) Nasopharyngeal Swab     Status: None   Collection Time: 03/31/20 10:07 AM   Specimen: Nasopharyngeal Swab; Nasopharyngeal(NP) swabs in vial transport medium  Result Value Ref Range Status   SARS Coronavirus 2 by RT PCR NEGATIVE NEGATIVE Final    Comment: (NOTE) SARS-CoV-2 target nucleic acids are NOT DETECTED.  The SARS-CoV-2 RNA is generally detectable in upper respiratory specimens during the acute phase of infection. The lowest concentration of SARS-CoV-2 viral copies this assay can detect is 138 copies/mL. A negative result does not preclude SARS-Cov-2 infection and should not be used as the sole basis for treatment or other patient management decisions. A negative result may occur with  improper specimen collection/handling, submission of specimen other than nasopharyngeal swab, presence of viral mutation(s) within the areas targeted by this assay, and inadequate number of viral copies(<138 copies/mL). A negative result must be combined with clinical observations, patient history, and epidemiological information. The expected result is Negative.  Fact Sheet for Patients:  EntrepreneurPulse.com.au  Fact Sheet for Healthcare Providers:  IncredibleEmployment.be  This test is no t yet approved or cleared by the [place] FDA and  has been authorized for detection and/or diagnosis of SARS-CoV-2 by FDA under an Emergency Use Authorization (EUA). This EUA will remain  in effect (meaning this test can be used) for the duration of the COVID-19 declaration under Section 564(b)(1) of the Act, 21 U.S.C.section 360bbb-3(b)(1), unless the authorization is terminated  or revoked sooner.       Influenza A by PCR NEGATIVE NEGATIVE Final   Influenza B by PCR NEGATIVE NEGATIVE Final    Comment: (NOTE) The Xpert Xpress SARS-CoV-2/FLU/RSV plus assay is intended as an  aid in the diagnosis of influenza from Nasopharyngeal swab specimens and should not be used as a sole basis for treatment. Nasal washings and aspirates are unacceptable for Xpert Xpress SARS-CoV-2/FLU/RSV testing.  Fact Sheet for Patients: EntrepreneurPulse.com.au  Fact Sheet for Healthcare Providers: IncredibleEmployment.be  This test is not yet approved or cleared by the [place] FDA and has been authorized for detection and/or diagnosis of SARS-CoV-2 by FDA under an Emergency Use  Authorization (EUA). This EUA will remain in effect (meaning this test can be used) for the duration of the COVID-19 declaration under Section 564(b)(1) of the Act, 21 U.S.C. section 360bbb-3(b)(1), unless the authorization is terminated or revoked.  Performed at [facility], [address] [place], [place] [address]   Culture, blood (Routine X 2) w Reflex to ID Panel     Status: None   Collection Time: 03/31/20 10:07 AM   Specimen: BLOOD LEFT ARM  Result Value Ref Range Status   Specimen Description BLOOD LEFT ARM  Final   Special Requests   Final    BOTTLES DRAWN AEROBIC AND ANAEROBIC Blood Culture results may not be optimal due to an inadequate volume of blood received in culture bottles   Culture   Final    NO GROWTH 5 DAYS Performed at [facility], [address] [place], [place] [address]    Report Status 04/05/2020 FINAL  Final  Culture, blood (Routine X 2) w Reflex to ID Panel     Status: None   Collection Time: 03/31/20 10:07 AM   Specimen: BLOOD RIGHT HAND  Result Value Ref Range Status   Specimen Description BLOOD RIGHT HAND  Final   Special Requests   Final    BOTTLES DRAWN AEROBIC AND ANAEROBIC Blood Culture results may not be optimal due to an inadequate volume of blood received in culture bottles   Culture   Final    NO GROWTH 5 DAYS Performed at [facility], [address] [place], [place] [address]     Report Status 04/05/2020 FINAL  Final  Urine Culture     Status: None   Collection Time: 03/31/20 10:07 AM   Specimen: Urine, Random  Result Value Ref Range Status   Specimen Description   Final    URINE, RANDOM Performed at [facility], [address] [place], [place] [address]    Special Requests   Final    NONE Performed at [facility], [address] [place], [place] [address]    Culture   Final    NO GROWTH Performed at [facility], [place] [address] [place],  [address]    Report Status 04/01/2020 FINAL  Final         Radiology Studies: No results found.      Scheduled Meds: . amLODipine  10 mg Oral Daily  . atorvastatin  80 mg Oral Daily  . clopidogrel  75 mg Oral Daily  . diltiazem  120 mg Oral TID  . enoxaparin (LOVENOX) injection  47.5 mg Subcutaneous Q24H  . insulin aspart  0-5 Units Subcutaneous QHS  . insulin aspart  0-9 Units Subcutaneous TID WC  . insulin aspart  3 Units Subcutaneous TID WC  . melatonin  5 mg Oral QHS  . metoprolol tartrate  25 mg Oral BID  . nicotine  21 mg Transdermal Daily  . QUEtiapine  50 mg Oral QHS  . risperiDONE  1 mg Oral QHS  . sertraline  25 mg Oral Daily  . tamsulosin  0.4 mg Oral Daily   Continuous Infusions: . sodium chloride 100 mL/hr at 04/05/20 1742     LOS: 4 days    Enzo Bi, MD [facility] Pager [phone]  If 7PM-7AM, please contact night-coverage 04/05/2020, 5:59 PM

## 2020-04-05 NOTE — TOC Progression Note (Signed)
Transition of Care [facility]) - Progression Note    Patient Details  Name: Steven Ingram MRN: [id number] Date of Birth: 1951/04/03  [facility]) CM/SW [place], LCSW Phone Number: 04/05/2020, 9:23 AM  Clinical Narrative:       CSW informed that patient got a sitter last night which MD D/C'd this morning. Patient cannot go to [facility] until he is sitter free for 24 hours, updated Chris at [facility]. Plan to DC to [facility] tomorrow.    Expected Discharge Plan: [place]    Expected Discharge Plan and Services Expected Discharge Plan: [place] arrangements for the past 2 months: Single Family Home                                       Social Determinants of Health (SDOH) Interventions    Readmission Risk Interventions Readmission Risk Prevention Plan 10/04/2018  Post Dischage Appt Not Complete  Medication Screening Complete  Transportation Screening Complete

## 2020-04-05 NOTE — Progress Notes (Signed)
Occupational Therapy Treatment Patient Details Name: Steven Ingram MRN: [id number] DOB: 1951-02-16 Today's Date: 04/05/2020    History of present illness 69 y.o. male with medical history significant of hypertension, hyperlipidemia, dCHF, diabetes mellitus, stroke, depression, CAD, s/p for right nephrectomy, CKD-3A, tobacco abuse, who presents with altered mental status.   OT comments  Upon entering the room, pt supine in bed with breakfast tray open but pt not eating. Pt is agreeable to OT intervention this session. Supine >sit with min A to EOB. Pt needing close supervision - min A for sitting balance with posterior bias. OT able to providing min cuing for anterior weight shift and pt able to correct but needing min A once fatigued. Pt sitting and drinking juice, milk, and banana with cuing to initiate task. Pt able to sit EOB for 10 minutes. Pt standing x 3 reps with mod lifting assistance from standard bed level. Pt side stepping to L with min A and use of RW. Sit >supine with mod A for B LEs. Pt making progress towards goals and continues to benefit from OT intervention.    Follow Up Recommendations  SNF;Supervision/Assistance - 24 hour    Equipment Recommendations  Other (comment) (defer to next venue of care)       Precautions / Restrictions Precautions Precautions: Fall       Mobility Bed Mobility Overal bed mobility: Needs Assistance Bed Mobility: Supine to Sit     Supine to sit: Min assist Sit to supine: Mod assist   General bed mobility comments: min A for trunk supine >sit with assist for B LEs to return to bed at end of session  Transfers Overall transfer level: Needs assistance Equipment used: 1 person hand held assist Transfers: Sit to/from Stand Sit to Stand: Mod assist Stand pivot transfers: Min assist       General transfer comment: mod lifting assistance to stand from bed with min A for balance with pivot. Pt continues to have posterior bias but  improved since last session.    Balance Overall balance assessment: Needs assistance Sitting-balance support: Feet supported Sitting balance-Leahy Scale: Fair Sitting balance - Comments: min cuing for anterior weight shift   Standing balance support: During functional activity;Bilateral upper extremity supported Standing balance-Leahy Scale: Poor                             ADL either performed or assessed with clinical judgement   ADL Overall ADL's : Needs assistance/impaired Eating/Feeding: Set up;Supervision/ safety;Cueing for safety;Cueing for sequencing Eating/Feeding Details (indicate cue type and reason): cuing to initiate with supervision - min A for sitting balance Grooming: Wash/dry hands;Wash/dry face;Sitting;Set up;Supervision/safety                                       Vision Patient Visual Report: No change from baseline            Cognition Arousal/Alertness: Awake/alert Behavior During Therapy: WFL for tasks assessed/performed Overall Cognitive Status: Impaired/Different from baseline Area of Impairment: Attention;Safety/judgement;Awareness                 Orientation Level: Disoriented to;Time;Situation Current Attention Level: Sustained   Following Commands: Follows one step commands inconsistently;Follows one step commands with increased time Safety/Judgement: Decreased awareness of safety;Decreased awareness of deficits Awareness: Intellectual   General Comments: Pt needing min - mod multimodal cuing to  initiate tasks this session                   Pertinent Vitals/ Pain       Pain Assessment: No/denies pain         Frequency  Min 1X/week        Progress Toward Goals  OT Goals(current goals can now be found in the care plan section)  Progress towards OT goals: Progressing toward goals  Acute Rehab OT Goals Patient Stated Goal: to go home OT Goal Formulation: With patient/family Time For Goal  Achievement: 04/15/20 Potential to Achieve Goals: Good  Plan Discharge plan remains appropriate       AM-PAC OT "6 Clicks" Daily Activity     Outcome Measure   Help from another person eating meals?: A Little Help from another person taking care of personal grooming?: A Little Help from another person toileting, which includes using toliet, bedpan, or urinal?: A Lot Help from another person bathing (including washing, rinsing, drying)?: A Lot Help from another person to put on and taking off regular upper body clothing?: A Little Help from another person to put on and taking off regular lower body clothing?: A Lot 6 Click Score: 15    End of Session Equipment Utilized During Treatment: Rolling walker  OT Visit Diagnosis: Unsteadiness on feet (R26.81);Muscle weakness (generalized) (M62.81);History of falling (Z91.81)   Activity Tolerance Patient tolerated treatment well   Patient Left in bed;with call bell/phone within reach;with bed alarm set;with family/visitor present   Nurse Communication Mobility status;Precautions        Time: 0037-0488 OT Time Calculation (min): 24 min  Charges: OT General Charges $OT Visit: 1 Visit OT Treatments $Self Care/Home Management : 8-22 mins $Therapeutic Activity: 8-22 mins  Darleen Crocker, MS, OTR/L , CBIS ascom [phone]  04/05/20, 12:48 PM

## 2020-04-05 NOTE — Progress Notes (Signed)
PT Cancellation Note  Patient Details Name: Steven Ingram MRN: [id number] DOB: 02-27-1951   Cancelled Treatment:    Reason Eval/Treat Not Completed: Fatigue/lethargy limiting ability to participate   Attempted x 2 this pm.  Pt sleeping and did not awake for session.  Will defer session until tomorrow.   Chesley Noon 04/05/2020, 3:27 PM

## 2020-04-06 DIAGNOSIS — N1832 Chronic kidney disease, stage 3b: Secondary | ICD-10-CM

## 2020-04-06 DIAGNOSIS — E1122 Type 2 diabetes mellitus with diabetic chronic kidney disease: Secondary | ICD-10-CM

## 2020-04-06 DIAGNOSIS — I5032 Chronic diastolic (congestive) heart failure: Secondary | ICD-10-CM

## 2020-04-06 DIAGNOSIS — N189 Chronic kidney disease, unspecified: Secondary | ICD-10-CM

## 2020-04-06 DIAGNOSIS — E876 Hypokalemia: Secondary | ICD-10-CM

## 2020-04-06 DIAGNOSIS — F0390 Unspecified dementia without behavioral disturbance: Secondary | ICD-10-CM

## 2020-04-06 LAB — CBC
HCT: 33.3 % — ABNORMAL LOW (ref 39.0–52.0)
Hemoglobin: 11.6 g/dL — ABNORMAL LOW (ref 13.0–17.0)
MCH: 33 pg (ref 26.0–34.0)
MCHC: 34.8 g/dL (ref 30.0–36.0)
MCV: 94.6 fL (ref 80.0–100.0)
Platelets: 170 10*3/uL (ref 150–400)
RBC: 3.52 MIL/uL — ABNORMAL LOW (ref 4.22–5.81)
RDW: 11.4 % — ABNORMAL LOW (ref 11.5–15.5)
WBC: 4.7 10*3/uL (ref 4.0–10.5)
nRBC: 0 % (ref 0.0–0.2)

## 2020-04-06 LAB — BASIC METABOLIC PANEL
Anion gap: 10 (ref 5–15)
BUN: 23 mg/dL (ref 8–23)
CO2: 21 mmol/L — ABNORMAL LOW (ref 22–32)
Calcium: 8.8 mg/dL — ABNORMAL LOW (ref 8.9–10.3)
Chloride: 109 mmol/L (ref 98–111)
Creatinine, Ser: 1.7 mg/dL — ABNORMAL HIGH (ref 0.61–1.24)
GFR, Estimated: 43 mL/min — ABNORMAL LOW (ref >60)
Glucose, Bld: 146 mg/dL — ABNORMAL HIGH (ref 70–99)
Potassium: 3.9 mmol/L (ref 3.5–5.1)
Sodium: 140 mmol/L (ref 135–145)

## 2020-04-06 LAB — MAGNESIUM: Magnesium: 2.1 mg/dL (ref 1.7–2.4)

## 2020-04-06 LAB — GLUCOSE, CAPILLARY: Glucose-Capillary: 114 mg/dL — ABNORMAL HIGH (ref 70–99)

## 2020-04-06 MED ORDER — METOPROLOL TARTRATE 25 MG PO TABS: 25.0000 mg | ORAL_TABLET | Freq: Two times a day (BID) | ORAL | 0 refills | Status: DC

## 2020-04-06 MED ORDER — FLUTICASONE PROPIONATE 50 MCG/ACT NA SUSP
2.0000 | Freq: Every day | NASAL | Status: DC
  Filled 2020-04-06: qty 16

## 2020-04-06 MED ORDER — NICOTINE 21 MG/24HR TD PT24: MEDICATED_PATCH | TRANSDERMAL | 0 refills | Status: DC

## 2020-04-06 MED ORDER — DILTIAZEM HCL 120 MG PO TABS: 120.0000 mg | ORAL_TABLET | Freq: Three times a day (TID) | ORAL | 0 refills | Status: DC

## 2020-04-06 MED ORDER — INSULIN ASPART 100 UNIT/ML ~~LOC~~ SOLN: 3.0000 [IU] | Freq: Three times a day (TID) | SUBCUTANEOUS | 11 refills | Status: DC

## 2020-04-06 MED ORDER — FLUTICASONE PROPIONATE 50 MCG/ACT NA SUSP: 2.0000 | Freq: Every day | NASAL | 0 refills | Status: DC

## 2020-04-06 MED ORDER — MELATONIN 5 MG PO TABS: 5.0000 mg | ORAL_TABLET | Freq: Every day | ORAL | 0 refills | Status: DC

## 2020-04-06 NOTE — Discharge Summary (Signed)
[facility] NAME: Steven Ingram    MR#:  [id number]  DATE OF BIRTH:  1950-05-21  DATE OF ADMISSION:  03/31/2020 ADMITTING PHYSICIAN: Sidney Ace, MD  DATE OF DISCHARGE: 04/06/20  PRIMARY CARE PHYSICIAN: [facility]    ADMISSION DIAGNOSIS:  SIRS (systemic inflammatory response syndrome) (HCC) [D53.29] Acute metabolic encephalopathy [J24.26] Encephalopathy acute [G93.40]  DISCHARGE DIAGNOSIS:  Principal Problem:   Acute metabolic encephalopathy Active Problems:   History of CVA (cerebrovascular accident)   Type 2 diabetes mellitus with stage 3 chronic kidney disease (HCC)   CAD (coronary artery disease)   HTN (hypertension)   SIRS (systemic inflammatory response syndrome) (HCC)   Acute renal failure superimposed on stage 3a chronic kidney disease (HCC)   HLD (hyperlipidemia)   Chronic diastolic CHF (congestive heart failure) ([place])   Encephalopathy acute   SECONDARY DIAGNOSIS:   Past Medical History:  Diagnosis Date  . Diabetes mellitus without complication ([place])   . High cholesterol   . Hypertension     HOSPITAL COURSE:   1.  Acute metabolic encephalopathy.  The patient likely has underlying dementia.  Patient is on Seroquel and risperidone.  Patient initially had systemic inflammatory response syndrome and was placed on antibiotics but since procalcitonin was negative antibiotics were discontinued. 2.  Systemic inflammatory response syndrome.  The patient did have tachycardia and a fever upon coming in patient did receive antibiotics during the hospital course but since the procalcitonin was negative antibiotics were discontinued. 3.  Ataxia.  MRI of the brain did not show any acute finding.  The patient did have an old stroke and a 9 mm meningioma. 4.  Tachycardia the patient was increased on the diltiazem and added metoprolol orally. 5.  Acute kidney injury on chronic kidney disease stage  IIIb.  Holding off on lisinopril.  Patient was given intermittent IV fluids.  Creatinine 1.7 upon disposition.  Check a BMP in 1 week. 6.  Chronic diastolic congestive heart failure.  No signs of heart failure currently.  With his kidney function being where it is I am more concerned about him being dehydrated 7.  Hypokalemia this was replaced during the hospital course 8.  Type 2 diabetes mellitus with chronic kidney disease stage IIIb.  Patient's hemoglobin A1c is 7.0.  Will do diet control at this point. 9.  Dysphagia 3 diet carb modified with thin liquids.  DISCHARGE CONDITIONS:   Fair  CONSULTS OBTAINED:  None  DRUG ALLERGIES:  No Known Allergies  DISCHARGE MEDICATIONS:   Allergies as of 04/06/2020   No Known Allergies     Medication List    STOP taking these medications   lisinopril 40 MG tablet Commonly known as: ZESTRIL     TAKE these medications   amLODipine 10 MG tablet Commonly known as: NORVASC Take 10 mg by mouth daily.   atorvastatin 80 MG tablet Commonly known as: LIPITOR Take 80 mg by mouth daily.   clopidogrel 75 MG tablet Commonly known as: PLAVIX Take 1 tablet (75 mg total) by mouth daily.   diltiazem 120 MG tablet Commonly known as: CARDIZEM Take 1 tablet (120 mg total) by mouth 3 (three) times daily. What changed:  medication strength how much to take   fluticasone 50 MCG/ACT nasal spray Commonly known as: FLONASE Place 2 sprays into both nostrils daily.   insulin aspart 100 UNIT/ML injection Commonly known as: novoLOG Inject 3 Units into the skin 3 (three) times daily  with meals.   melatonin 5 MG Tabs Take 1 tablet (5 mg total) by mouth at bedtime.   metoprolol tartrate 25 MG tablet Commonly known as: LOPRESSOR Take 1 tablet (25 mg total) by mouth 2 (two) times daily.   nicotine 21 mg/24hr patch Commonly known as: NICODERM CQ - dosed in mg/24 hours 21 mg patch chest wall daily   QUEtiapine 50 MG tablet Commonly known as:  SEROQUEL Take 50 mg by mouth at bedtime.   risperiDONE 1 MG tablet Commonly known as: RISPERDAL Take 1 mg by mouth at bedtime.   sertraline 25 MG tablet Commonly known as: ZOLOFT Take 25 mg by mouth daily.   tamsulosin 0.4 MG Caps capsule Commonly known as: FLOMAX Take 0.4 mg by mouth daily.   Vitamin D (Ergocalciferol) 1.25 MG (50000 UNIT) Caps capsule Commonly known as: DRISDOL Take 50,000 Units by mouth once a week.        DISCHARGE INSTRUCTIONS:   Follow-up team at rehab 1 day  If you experience worsening of your admission symptoms, develop shortness of breath, life threatening emergency, suicidal or homicidal thoughts you must seek medical attention immediately by calling 911 or calling your MD immediately  if symptoms less severe.  You Must read complete instructions/literature along with all the possible adverse reactions/side effects for all the Medicines you take and that have been prescribed to you. Take any new Medicines after you have completely understood and accept all the possible adverse reactions/side effects.   Please note  You were cared for by a hospitalist during your hospital stay. If you have any questions about your discharge medications or the care you received while you were in the hospital after you are discharged, you can call the unit and asked to speak with the hospitalist on call if the hospitalist that took care of you is not available. Once you are discharged, your primary care physician will handle any further medical issues. Please note that NO REFILLS for any discharge medications will be authorized once you are discharged, as it is imperative that you return to your primary care physician (or establish a relationship with a primary care physician if you do not have one) for your aftercare needs so that they can reassess your need for medications and monitor your lab values.    Today   CHIEF COMPLAINT:   Chief Complaint  Patient presents  with  . Weakness    HISTORY OF PRESENT ILLNESS:  Steven Ingram  is a 69 y.o. male brought in with weakness   VITAL SIGNS:  Blood pressure (!) 147/64, pulse 80, temperature (!) 97.5 F (36.4 C), resp. rate 16, height 5\' 9"  (1.753 m), weight 94 kg, SpO2 98 %.  I/O:    Intake/Output Summary (Last 24 hours) at 04/06/2020 0939 Last data filed at 04/05/2020 1853 Gross per 24 hour  Intake 857.96 ml  Output --  Net 857.96 ml    PHYSICAL EXAMINATION:  GENERAL:  69 y.o.-year-old patient lying in the bed with no acute distress.  EYES: Pupils equal, round, reactive to light and accommodation. No scleral icterus. HEENT: Head atraumatic, normocephalic. Oropharynx and nasopharynx clear.  LUNGS: Normal breath sounds bilaterally, no wheezing, rales,rhonchi or crepitation. No use of accessory muscles of respiration.  CARDIOVASCULAR: S1, S2 normal. No murmurs, rubs, or gallops.  ABDOMEN: Soft, non-tender, non-distended. Bowel sounds present. No organomegaly or mass.  EXTREMITIES: Trace pedal edema.  NEUROLOGIC: Cranial nerves II through XII are intact. Sensation intact. Gait not checked.  PSYCHIATRIC:  The patient is alert and answers some questions appropriately.  SKIN: No obvious rash, lesion, or ulcer.   DATA REVIEW:   CBC Recent Labs  Lab 04/06/20 0506  WBC 4.7  HGB 11.6*  HCT 33.3*  PLT 170    Chemistries  Recent Labs  Lab 03/31/20 1007 03/31/20 2039 04/06/20 0506  NA 143   < > 140  K 3.5   < > 3.9  CL 98   < > 109  CO2 24   < > 21*  GLUCOSE 210*   < > 146*  BUN 16   < > 23  CREATININE 1.67*   < > 1.70*  CALCIUM 8.9   < > 8.8*  MG  --    < > 2.1  AST 17  --   --   ALT 15  --   --   ALKPHOS 50  --   --   BILITOT 1.3*  --   --    < > = values in this interval not displayed.     Microbiology Results  Results for orders placed or performed during the hospital encounter of 03/31/20  Resp Panel by RT-PCR (Flu A&B, Covid) Nasopharyngeal Swab     Status: None    Collection Time: 03/31/20 10:07 AM   Specimen: Nasopharyngeal Swab; Nasopharyngeal(NP) swabs in vial transport medium  Result Value Ref Range Status   SARS Coronavirus 2 by RT PCR NEGATIVE NEGATIVE Final    Comment: (NOTE) SARS-CoV-2 target nucleic acids are NOT DETECTED.  The SARS-CoV-2 RNA is generally detectable in upper respiratory specimens during the acute phase of infection. The lowest concentration of SARS-CoV-2 viral copies this assay can detect is 138 copies/mL. A negative result does not preclude SARS-Cov-2 infection and should not be used as the sole basis for treatment or other patient management decisions. A negative result may occur with  improper specimen collection/handling, submission of specimen other than nasopharyngeal swab, presence of viral mutation(s) within the areas targeted by this assay, and inadequate number of viral copies(<138 copies/mL). A negative result must be combined with clinical observations, patient history, and epidemiological information. The expected result is Negative.  Fact Sheet for Patients:  EntrepreneurPulse.com.au  Fact Sheet for Healthcare Providers:  IncredibleEmployment.be  This test is no t yet approved or cleared by the [place] FDA and  has been authorized for detection and/or diagnosis of SARS-CoV-2 by FDA under an Emergency Use Authorization (EUA). This EUA will remain  in effect (meaning this test can be used) for the duration of the COVID-19 declaration under Section 564(b)(1) of the Act, 21 U.S.C.section 360bbb-3(b)(1), unless the authorization is terminated  or revoked sooner.       Influenza A by PCR NEGATIVE NEGATIVE Final   Influenza B by PCR NEGATIVE NEGATIVE Final    Comment: (NOTE) The Xpert Xpress SARS-CoV-2/FLU/RSV plus assay is intended as an aid in the diagnosis of influenza from Nasopharyngeal swab specimens and should not be used as a sole basis for treatment.  Nasal washings and aspirates are unacceptable for Xpert Xpress SARS-CoV-2/FLU/RSV testing.  Fact Sheet for Patients: EntrepreneurPulse.com.au  Fact Sheet for Healthcare Providers: IncredibleEmployment.be  This test is not yet approved or cleared by the [place] FDA and has been authorized for detection and/or diagnosis of SARS-CoV-2 by FDA under an Emergency Use Authorization (EUA). This EUA will remain in effect (meaning this test can be used) for the duration of the COVID-19 declaration under Section 564(b)(1) of the Act, 21 U.S.C. section  360bbb-3(b)(1), unless the authorization is terminated or revoked.  Performed at [facility], [address] [place], [place] [address]   Culture, blood (Routine X 2) w Reflex to ID Panel     Status: None   Collection Time: 03/31/20 10:07 AM   Specimen: BLOOD LEFT ARM  Result Value Ref Range Status   Specimen Description BLOOD LEFT ARM  Final   Special Requests   Final    BOTTLES DRAWN AEROBIC AND ANAEROBIC Blood Culture results may not be optimal due to an inadequate volume of blood received in culture bottles   Culture   Final    NO GROWTH 5 DAYS Performed at [facility], [address] [place], [place] [address]    Report Status 04/05/2020 FINAL  Final  Culture, blood (Routine X 2) w Reflex to ID Panel     Status: None   Collection Time: 03/31/20 10:07 AM   Specimen: BLOOD RIGHT HAND  Result Value Ref Range Status   Specimen Description BLOOD RIGHT HAND  Final   Special Requests   Final    BOTTLES DRAWN AEROBIC AND ANAEROBIC Blood Culture results may not be optimal due to an inadequate volume of blood received in culture bottles   Culture   Final    NO GROWTH 5 DAYS Performed at [facility], [address] [place], [place] [address]    Report Status 04/05/2020 FINAL  Final  Urine Culture     Status: None   Collection Time: 03/31/20 10:07 AM   Specimen:  Urine, Random  Result Value Ref Range Status   Specimen Description   Final    URINE, RANDOM Performed at [facility], [address], [place], [place] [address]    Special Requests   Final    NONE Performed at [facility], [address], [place], [place] [address]    Culture   Final    NO GROWTH Performed at [facility], [place] [address] [place],  [address]    Report Status 04/01/2020 FINAL  Final     Management plans discussed with the patient, family and they are in agreement.  CODE STATUS:     Code Status Orders  (From admission, onward)         Start     Ordered   03/31/20 1339  Full code  Continuous        03/31/20 1338        Code Status History    Date Active Date Inactive Code Status Order ID Comments User Context   07/19/2019 0514 07/20/2019 2332 Full Code [id number]  Athena Masse, MD ED   10/03/2018 0143 10/07/2018 2153 Full Code [id number]  Seals, Theo Dills, NP ED   Advance Care Planning Activity      TOTAL TIME TAKING CARE OF THIS PATIENT: 34 minutes.    Loletha Grayer M.D on 04/06/2020 at 9:39 AM  Between 7am to 6pm - Pager - [phone]  After 6pm go to [email or web address] - password [email or web address]  [facility]  CC: Primary care physician; [employer]

## 2020-04-06 NOTE — TOC Transition Note (Addendum)
Transition of Care [facility]) - CM/SW Discharge Note   Patient Details  Name: Steven Ingram MRN: [id number] Date of Birth: [birth year]  Transition of [facility]) CM/SW Contact:  Magnus Ivan, LCSW Phone Number: 04/06/2020, 10:20 AM   Clinical Narrative:   Patient to discharge to [facility] today, Room 801. Confirmed with [facility] at [facility] CSW updated MD, RN, and left VM for patient's wife. Asked RN to call report and MD already submitted DC Summary. Medical Necessity Form and Face Sheet placed in Discharge Packet by patient chart. CSW will call EMS after RN calls report.  11:00- Informed by RN that she just called report. Arranged [facility] transport, they will pick up at 12:00 which is their next available time. Updated RN.   Final next level of care: Skilled Nursing Facility Barriers to Discharge: Barriers Resolved   Patient Goals and CMS Choice Patient states their goals for this hospitalization and ongoing recovery are:: SNF rehab CMS Medicare.gov Compare Post Acute Care list provided to:: Patient Represenative (must comment) Choice offered to / list presented to : Spouse  Discharge Placement              Patient chooses bed at: [facility] [place] Patient to be transferred to facility by: EMS Name of family member notified: Randye Lobo Patient and family notified of of transfer: 04/06/20  Discharge Plan and Services                                     Social Determinants of Health (SDOH) Interventions     Readmission Risk Interventions Readmission Risk Prevention Plan 10/04/2018  Post Dischage Appt Not Complete  Medication Screening Complete  Transportation Screening Complete

## 2020-04-18 ENCOUNTER — Other Ambulatory Visit: Payer: Self-pay

## 2020-04-18 DIAGNOSIS — R3589 Other polyuria: Secondary | ICD-10-CM

## 2020-04-19 ENCOUNTER — Ambulatory Visit (INDEPENDENT_AMBULATORY_CARE_PROVIDER_SITE_OTHER): Payer: Medicare PPO | Admitting: Urology

## 2020-04-19 ENCOUNTER — Other Ambulatory Visit: Payer: Self-pay

## 2020-04-19 ENCOUNTER — Encounter: Payer: Self-pay | Admitting: Urology

## 2020-04-19 VITALS — BP 150/79 | HR 78 | Ht 69.0 in | Wt 197.0 lb

## 2020-04-19 DIAGNOSIS — Z125 Encounter for screening for malignant neoplasm of prostate: Secondary | ICD-10-CM

## 2020-04-19 DIAGNOSIS — N401 Enlarged prostate with lower urinary tract symptoms: Secondary | ICD-10-CM

## 2020-04-19 DIAGNOSIS — N138 Other obstructive and reflux uropathy: Secondary | ICD-10-CM | POA: Diagnosis not present

## 2020-04-19 DIAGNOSIS — R32 Unspecified urinary incontinence: Secondary | ICD-10-CM

## 2020-04-19 LAB — BLADDER SCAN AMB NON-IMAGING

## 2020-04-19 NOTE — Progress Notes (Signed)
04/19/20 2:41 PM   Steven Ingram 1950-06-14 [id number]  CC: Urinary complaints, elevated PSA, history of nephrectomy  HPI: I saw Steven Ingram in urology clinic today for urinary complaints and elevated PSA.  He is an extremely comorbid 70 year old male with medical history notable for CKD(EGFR 56), history of multiple strokes and vascular disease, dementia, recent hospitalization for sepsis of unclear source, and diabetes.  He also apparently has a history of a right nephrectomy for RCC that was performed over 15 years ago at [facility] with no evidence of recurrence.  He is a very poor historian, and much of the history is obtained from chart review today.  The referral note states he is referred for some urinary problems of weak or spraying stream and leakage, as well as a PSA of 5.2 from 3.5 one year ago.  He is on Flomax at baseline.  He denies any family history of prostate cancer.  He really denies any urinary complaints today and denies any incontinence.  He is on Flomax nightly.  He denies any significant urgency or frequency.  He does not have any nocturia.  He denies any gross hematuria or UTIs.  Urine culture 2 weeks ago was negative.  IPSS score today is 9, and PVR is mildly elevated at 200 mL.  He denies any family history of prostate cancer.  He is anticoagulated for his history of stroke and CAD.   PMH: Past Medical History:  Diagnosis Date  . Acute kidney injury superimposed on CKD ([place]) 03/31/2020  . Acute metabolic encephalopathy 31/51/7616  . CAD (coronary artery disease) 07/19/2019  . Chronic diastolic CHF (congestive heart failure) ([place]) 03/31/2020  . CVA (cerebral vascular accident) ([place]) 10/03/2018  . Dementia without behavioral disturbance ([place])   . Diabetes mellitus without complication ([place])   . Encephalopathy acute 04/01/2020  . H/O right nephrectomy 07/19/2019  . High cholesterol   . History of CVA (cerebrovascular accident) 07/19/2019  . HLD (hyperlipidemia) 03/31/2020   . HTN (hypertension) 07/19/2019  . Hypertension   . Hypokalemia   . 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 Malnutrition of moderate degree 07/20/2019  . SIRS (systemic inflammatory response syndrome) ([place]) 03/31/2020  . Type 2 diabetes mellitus with stage 3 chronic kidney disease ([place]) 07/19/2019    Surgical History: Past Surgical History:  Procedure Laterality Date  . TEE WITHOUT CARDIOVERSION N/A 10/07/2018   Procedure: TRANSESOPHAGEAL ECHOCARDIOGRAM (TEE);  Surgeon: Teodoro Spray, MD;  Location: [facility] ORS;  Service: Cardiovascular;  Laterality: N/A;    Family History: Family History  Problem Relation Age of Onset  . Diabetes Mellitus II Mother   . Hypertension Mother   . Diabetes Mellitus II Father   . Hypertension Father   . Prostate cancer Neg Hx   . Bladder Cancer Neg Hx   . Kidney cancer Neg Hx     Social History:  reports that he has been smoking cigarettes. He has a 53.00 pack-year smoking history. He has never used smokeless tobacco. He reports current alcohol use. He reports that he does not use drugs.  Physical Exam: BP (!) 150/79   Pulse 78   Ht _0  (1.753 m)   Wt 197 lb (89.4 kg)   BMI 29.09 kg/m    Constitutional:  Alert and oriented, No acute distress. Cardiovascular: No clubbing, cyanosis, or edema. Respiratory: Normal respiratory effort, no increased work of breathing. GI: Abdomen is soft, nontender, nondistended, no abdominal masses GU: Circumcised phallus with patent meatus, no lesions testicles 20  cc and descended bilaterally DRE: Patient refused DRE  Laboratory Data: Creatinine 1.7, EGFR 43 Urine culture no growth 03/31/2020, >500 glucose Hemoglobin A1c 7  Pertinent Imaging: None to review  Assessment & Plan:   He is a very co-morbid 70 year old male referred for urinary symptoms but really denies any significant urinary complaints today and is on Flomax 0.4 mg nightly.  He also was found of a PSA of 5.2, which is actually within the normal  range for his age, additionally he refused DRE today.  Regardless, I do not think he is a good candidate for PSA screening with his numerous comorbidities and age of 44.  I reviewed the AUA guidelines with him that do not recommend routine screening in men over age 80, or those with a life expectancy of less than 5 to 10 years.  Regarding his distant history of RCC and radical nephrectomy, no further imaging follow-up is needed.  We discussed return precautions at length including gross hematuria, worsening urinary symptoms, UTIs, or retention.  Continue Flomax nightly RTC 1 year for PVR  I spent 65 total minutes on the day of the encounter including pre-visit review of the medical record, face-to-face time with the patient, and post visit ordering of labs/imaging/tests.  Nickolas Madrid, MD 04/19/2020  [facility] [address] [place], [place] [address] [phone]

## 2020-05-09 ENCOUNTER — Telehealth: Payer: Self-pay | Admitting: *Deleted

## 2020-05-09 NOTE — Telephone Encounter (Signed)
Attempted to reach patient to schedule follow up for CT Lung screening, patient discharged from the hospital to [facility].

## 2020-06-08 ENCOUNTER — Telehealth: Payer: Self-pay | Admitting: *Deleted

## 2020-06-08 NOTE — Telephone Encounter (Signed)
Patient is inpatient at [facility]. Attempted to contact for annual lung screening.

## 2020-06-15 ENCOUNTER — Emergency Department: Payer: Medicare Other

## 2020-06-15 ENCOUNTER — Inpatient Hospital Stay
Admission: EM | Admit: 2020-06-15 | Discharge: 2020-06-21 | DRG: 682 | Disposition: A | Discharge status: Home Health | Payer: Medicare Other | Attending: Internal Medicine | Admitting: Internal Medicine

## 2020-06-15 ENCOUNTER — Other Ambulatory Visit: Payer: Self-pay

## 2020-06-15 DIAGNOSIS — R339 Retention of urine, unspecified: Secondary | ICD-10-CM

## 2020-06-15 DIAGNOSIS — Z905 Acquired absence of kidney: Secondary | ICD-10-CM

## 2020-06-15 DIAGNOSIS — E1122 Type 2 diabetes mellitus with diabetic chronic kidney disease: Secondary | ICD-10-CM | POA: Diagnosis not present

## 2020-06-15 DIAGNOSIS — R531 Weakness: Secondary | ICD-10-CM | POA: Diagnosis present

## 2020-06-15 DIAGNOSIS — I130 Hypertensive heart and chronic kidney disease with heart failure and stage 1 through stage 4 chronic kidney disease, or unspecified chronic kidney disease: Secondary | ICD-10-CM | POA: Diagnosis not present

## 2020-06-15 DIAGNOSIS — Z20822 Contact with and (suspected) exposure to covid-19: Secondary | ICD-10-CM | POA: Diagnosis present

## 2020-06-15 DIAGNOSIS — M6281 Muscle weakness (generalized): Secondary | ICD-10-CM | POA: Diagnosis present

## 2020-06-15 DIAGNOSIS — G253 Myoclonus: Secondary | ICD-10-CM | POA: Diagnosis not present

## 2020-06-15 DIAGNOSIS — E1165 Type 2 diabetes mellitus with hyperglycemia: Secondary | ICD-10-CM | POA: Diagnosis not present

## 2020-06-15 DIAGNOSIS — E785 Hyperlipidemia, unspecified: Secondary | ICD-10-CM | POA: Diagnosis present

## 2020-06-15 DIAGNOSIS — N401 Enlarged prostate with lower urinary tract symptoms: Secondary | ICD-10-CM | POA: Diagnosis present

## 2020-06-15 DIAGNOSIS — Z7902 Long term (current) use of antithrombotics/antiplatelets: Secondary | ICD-10-CM

## 2020-06-15 DIAGNOSIS — I2510 Atherosclerotic heart disease of native coronary artery without angina pectoris: Secondary | ICD-10-CM | POA: Diagnosis not present

## 2020-06-15 DIAGNOSIS — R569 Unspecified convulsions: Secondary | ICD-10-CM

## 2020-06-15 DIAGNOSIS — F0390 Unspecified dementia without behavioral disturbance: Secondary | ICD-10-CM | POA: Diagnosis present

## 2020-06-15 DIAGNOSIS — R338 Other retention of urine: Secondary | ICD-10-CM | POA: Diagnosis present

## 2020-06-15 DIAGNOSIS — G9341 Metabolic encephalopathy: Secondary | ICD-10-CM | POA: Diagnosis not present

## 2020-06-15 DIAGNOSIS — N183 Chronic kidney disease, stage 3 unspecified: Secondary | ICD-10-CM | POA: Diagnosis present

## 2020-06-15 DIAGNOSIS — N1831 Chronic kidney disease, stage 3a: Secondary | ICD-10-CM | POA: Diagnosis present

## 2020-06-15 DIAGNOSIS — N189 Chronic kidney disease, unspecified: Secondary | ICD-10-CM | POA: Diagnosis not present

## 2020-06-15 DIAGNOSIS — I152 Hypertension secondary to endocrine disorders: Secondary | ICD-10-CM | POA: Diagnosis not present

## 2020-06-15 DIAGNOSIS — Z794 Long term (current) use of insulin: Secondary | ICD-10-CM

## 2020-06-15 DIAGNOSIS — Z8673 Personal history of transient ischemic attack (TIA), and cerebral infarction without residual deficits: Secondary | ICD-10-CM | POA: Diagnosis not present

## 2020-06-15 DIAGNOSIS — E1169 Type 2 diabetes mellitus with other specified complication: Secondary | ICD-10-CM | POA: Diagnosis present

## 2020-06-15 DIAGNOSIS — N179 Acute kidney failure, unspecified: Principal | ICD-10-CM | POA: Diagnosis present

## 2020-06-15 DIAGNOSIS — F17210 Nicotine dependence, cigarettes, uncomplicated: Secondary | ICD-10-CM | POA: Diagnosis not present

## 2020-06-15 DIAGNOSIS — N184 Chronic kidney disease, stage 4 (severe): Secondary | ICD-10-CM | POA: Diagnosis present

## 2020-06-15 DIAGNOSIS — R32 Unspecified urinary incontinence: Secondary | ICD-10-CM | POA: Diagnosis not present

## 2020-06-15 DIAGNOSIS — Z87440 Personal history of urinary (tract) infections: Secondary | ICD-10-CM

## 2020-06-15 DIAGNOSIS — Z833 Family history of diabetes mellitus: Secondary | ICD-10-CM

## 2020-06-15 DIAGNOSIS — I5032 Chronic diastolic (congestive) heart failure: Secondary | ICD-10-CM | POA: Diagnosis present

## 2020-06-15 DIAGNOSIS — R2681 Unsteadiness on feet: Secondary | ICD-10-CM | POA: Diagnosis not present

## 2020-06-15 DIAGNOSIS — R4181 Age-related cognitive decline: Secondary | ICD-10-CM | POA: Diagnosis not present

## 2020-06-15 DIAGNOSIS — R251 Tremor, unspecified: Secondary | ICD-10-CM

## 2020-06-15 DIAGNOSIS — Z79899 Other long term (current) drug therapy: Secondary | ICD-10-CM

## 2020-06-15 DIAGNOSIS — Z8249 Family history of ischemic heart disease and other diseases of the circulatory system: Secondary | ICD-10-CM

## 2020-06-15 LAB — DIFFERENTIAL
Abs Immature Granulocytes: 0.01 10*3/uL (ref 0.00–0.07)
Basophils Absolute: 0 10*3/uL (ref 0.0–0.1)
Basophils Relative: 0 %
Eosinophils Absolute: 0.1 10*3/uL (ref 0.0–0.5)
Eosinophils Relative: 3 %
Immature Granulocytes: 0 %
Lymphocytes Relative: 35 %
Lymphs Abs: 1.5 10*3/uL (ref 0.7–4.0)
Monocytes Absolute: 0.4 10*3/uL (ref 0.1–1.0)
Monocytes Relative: 10 %
Neutro Abs: 2.1 10*3/uL (ref 1.7–7.7)
Neutrophils Relative %: 52 %

## 2020-06-15 LAB — URINALYSIS, COMPLETE (UACMP) WITH MICROSCOPIC
Bacteria, UA: NONE SEEN
Bilirubin Urine: NEGATIVE
Glucose, UA: >=500 mg/dL — AB
Hgb urine dipstick: NEGATIVE
Ketones, ur: NEGATIVE mg/dL
Leukocytes,Ua: NEGATIVE
Nitrite: NEGATIVE
Protein, ur: >=300 mg/dL — AB
Specific Gravity, Urine: 1.016 (ref 1.005–1.030)
pH: 5 (ref 5.0–8.0)

## 2020-06-15 LAB — COMPREHENSIVE METABOLIC PANEL
ALT: 13 U/L (ref 0–44)
AST: 14 U/L — ABNORMAL LOW (ref 15–41)
Albumin: 3.2 g/dL — ABNORMAL LOW (ref 3.5–5.0)
Alkaline Phosphatase: 39 U/L (ref 38–126)
Anion gap: 6 (ref 5–15)
BUN: 27 mg/dL — ABNORMAL HIGH (ref 8–23)
CO2: 24 mmol/L (ref 22–32)
Calcium: 8.9 mg/dL (ref 8.9–10.3)
Chloride: 105 mmol/L (ref 98–111)
Creatinine, Ser: 1.88 mg/dL — ABNORMAL HIGH (ref 0.61–1.24)
GFR, Estimated: 38 mL/min — ABNORMAL LOW (ref >60)
Glucose, Bld: 342 mg/dL — ABNORMAL HIGH (ref 70–99)
Potassium: 4.2 mmol/L (ref 3.5–5.1)
Sodium: 135 mmol/L (ref 135–145)
Total Bilirubin: 0.6 mg/dL (ref 0.3–1.2)
Total Protein: 6.1 g/dL — ABNORMAL LOW (ref 6.5–8.1)

## 2020-06-15 LAB — CBC
HCT: 33.2 % — ABNORMAL LOW (ref 39.0–52.0)
Hemoglobin: 11 g/dL — ABNORMAL LOW (ref 13.0–17.0)
MCH: 32.1 pg (ref 26.0–34.0)
MCHC: 33.1 g/dL (ref 30.0–36.0)
MCV: 96.8 fL (ref 80.0–100.0)
Platelets: 215 10*3/uL (ref 150–400)
RBC: 3.43 MIL/uL — ABNORMAL LOW (ref 4.22–5.81)
RDW: 11.9 % (ref 11.5–15.5)
WBC: 4.1 10*3/uL (ref 4.0–10.5)
nRBC: 0 % (ref 0.0–0.2)

## 2020-06-15 LAB — APTT: aPTT: 27 seconds (ref 24–36)

## 2020-06-15 LAB — TROPONIN I (HIGH SENSITIVITY): Troponin I (High Sensitivity): 36 ng/L — ABNORMAL HIGH (ref <18)

## 2020-06-15 LAB — TSH: TSH: 4.647 u[IU]/mL — ABNORMAL HIGH (ref 0.350–4.500)

## 2020-06-15 LAB — PROTIME-INR
INR: 1 (ref 0.8–1.2)
Prothrombin Time: 12.9 seconds (ref 11.4–15.2)

## 2020-06-15 LAB — CBG MONITORING, ED: Glucose-Capillary: 252 mg/dL — ABNORMAL HIGH (ref 70–99)

## 2020-06-15 MED ORDER — ONDANSETRON HCL 4 MG/2ML IJ SOLN: 4.0000 mg | Freq: Four times a day (QID) | INTRAMUSCULAR | Status: DC | PRN

## 2020-06-15 MED ORDER — RISPERIDONE 0.5 MG PO TABS
0.5000 mg | ORAL_TABLET | Freq: Every day | ORAL | Status: DC
  Administered 2020-06-16 – 2020-06-21 (×6): 0.5 mg via ORAL
  Filled 2020-06-15 (×6): qty 1

## 2020-06-15 MED ORDER — LACTATED RINGERS IV BOLUS
1000.0000 mL | Freq: Once | INTRAVENOUS | Status: AC
  Administered 2020-06-15: 1000 mL via INTRAVENOUS

## 2020-06-15 MED ORDER — MIDAZOLAM HCL 2 MG/2ML IJ SOLN
1.0000 mg | Freq: Once | INTRAMUSCULAR | Status: AC
  Administered 2020-06-15: 1 mg via INTRAVENOUS
  Filled 2020-06-15: qty 2

## 2020-06-15 MED ORDER — ACETAMINOPHEN 650 MG RE SUPP: 650.0000 mg | Freq: Four times a day (QID) | RECTAL | Status: DC | PRN

## 2020-06-15 MED ORDER — INSULIN ASPART 100 UNIT/ML ~~LOC~~ SOLN
0.0000 [IU] | SUBCUTANEOUS | Status: DC
  Administered 2020-06-15: 8 [IU] via SUBCUTANEOUS
  Filled 2020-06-15: qty 1

## 2020-06-15 MED ORDER — TAMSULOSIN HCL 0.4 MG PO CAPS
0.4000 mg | ORAL_CAPSULE | Freq: Two times a day (BID) | ORAL | Status: DC
Start: 2020-06-16 — End: 2020-06-22
  Administered 2020-06-16 – 2020-06-21 (×12): 0.4 mg via ORAL
  Filled 2020-06-15 (×12): qty 1

## 2020-06-15 MED ORDER — INSULIN ASPART 100 UNIT/ML ~~LOC~~ SOLN
0.0000 [IU] | Freq: Three times a day (TID) | SUBCUTANEOUS | Status: DC
  Administered 2020-06-16 (×2): 3 [IU] via SUBCUTANEOUS
  Administered 2020-06-16 – 2020-06-17 (×2): 2 [IU] via SUBCUTANEOUS
  Administered 2020-06-17 – 2020-06-18 (×5): 3 [IU] via SUBCUTANEOUS
  Administered 2020-06-19: 09:00:00 2 [IU] via SUBCUTANEOUS
  Administered 2020-06-19 (×2): 3 [IU] via SUBCUTANEOUS
  Administered 2020-06-20: 18:00:00 5 [IU] via SUBCUTANEOUS
  Administered 2020-06-20 – 2020-06-21 (×3): 2 [IU] via SUBCUTANEOUS
  Administered 2020-06-21: 5 [IU] via SUBCUTANEOUS
  Administered 2020-06-21: 1 [IU] via SUBCUTANEOUS
  Filled 2020-06-15 (×4): qty 1
  Filled 2020-06-15: qty 0.05
  Filled 2020-06-15 (×9): qty 1

## 2020-06-15 MED ORDER — SERTRALINE HCL 25 MG PO TABS
25.0000 mg | ORAL_TABLET | Freq: Every day | ORAL | Status: DC
  Administered 2020-06-16 – 2020-06-21 (×6): 25 mg via ORAL
  Filled 2020-06-15 (×4): qty 1
  Filled 2020-06-15: qty 0.5
  Filled 2020-06-15: qty 1

## 2020-06-15 MED ORDER — ONDANSETRON HCL 4 MG PO TABS: 4.0000 mg | ORAL_TABLET | Freq: Four times a day (QID) | ORAL | Status: DC | PRN

## 2020-06-15 MED ORDER — ACETAMINOPHEN 325 MG PO TABS: 650.0000 mg | ORAL_TABLET | Freq: Four times a day (QID) | ORAL | Status: DC | PRN

## 2020-06-15 MED ORDER — ATORVASTATIN CALCIUM 80 MG PO TABS
80.0000 mg | ORAL_TABLET | Freq: Every day | ORAL | Status: DC
  Administered 2020-06-16 – 2020-06-20 (×5): 80 mg via ORAL
  Filled 2020-06-15 (×2): qty 4
  Filled 2020-06-15: qty 1
  Filled 2020-06-15 (×3): qty 4
  Filled 2020-06-15: qty 8

## 2020-06-15 MED ORDER — SODIUM CHLORIDE 0.9% FLUSH: 3.0000 mL | Freq: Once | INTRAVENOUS | Status: DC

## 2020-06-15 MED ORDER — AMLODIPINE BESYLATE 5 MG PO TABS
10.0000 mg | ORAL_TABLET | Freq: Every day | ORAL | Status: DC
  Administered 2020-06-16 – 2020-06-21 (×6): 10 mg via ORAL
  Filled 2020-06-15: qty 1
  Filled 2020-06-15: qty 2
  Filled 2020-06-15 (×4): qty 1

## 2020-06-15 MED ORDER — CLOPIDOGREL BISULFATE 75 MG PO TABS
75.0000 mg | ORAL_TABLET | Freq: Every day | ORAL | Status: DC
  Administered 2020-06-16 – 2020-06-21 (×6): 75 mg via ORAL
  Filled 2020-06-15 (×6): qty 1

## 2020-06-15 MED ORDER — HEPARIN SODIUM (PORCINE) 5000 UNIT/ML IJ SOLN
5000.0000 [IU] | Freq: Three times a day (TID) | INTRAMUSCULAR | Status: DC
  Administered 2020-06-16 – 2020-06-21 (×16): 5000 [IU] via SUBCUTANEOUS
  Filled 2020-06-15 (×16): qty 1

## 2020-06-15 MED ORDER — SODIUM CHLORIDE 0.9% FLUSH
3.0000 mL | Freq: Two times a day (BID) | INTRAVENOUS | Status: DC
  Administered 2020-06-16 – 2020-06-21 (×9): 3 mL via INTRAVENOUS

## 2020-06-15 NOTE — ED Triage Notes (Addendum)
Pt comes via pOV from home with c/o slurred speech, jerks and some weakness that started around 0830. Pt had another episode at 11a.  Wife reports pt was drooling and shaking. Pt denies any blurry vision or pain.   Pt denies any CP, SOB or dizziness.  Pt has hx of stroke in past. Wife states pt wasn't even able to stand and walk because he is so weak.  Pt having moments of shaking and jerks while in triage.  Pt speaking in complete sentences, no drooling noted, hand grips equal.

## 2020-06-15 NOTE — ED Provider Notes (Signed)
[facility] EMERGENCY DEPARTMENT Provider Note   CSN: [id number] Arrival date & time: 06/15/20  1756     History Chief Complaint  Patient presents with  . Weakness    Steven Ingram is a 70 y.o. male.  With a past medical history of CAD, CHF, HTN, HDL, CVA, dementia, DM, RCC s/p R nephrectomy, and recent admission in December of last year for metabolic encephalopathy who presents accompanied by home for assessment of generalized weakness associated with shaking that his wife noticed around 30 AM today.  He reportedly went to bed normal last night but patient nor wife is sure when he was last normal.  Patient is not oriented but denies any acute pain or recent symptoms or falls.  Per his wife who he lives with he has not had any recent cough, fever, vomiting, diarrhea or any other acute sick symptoms.  He has been taking all his medications although she does note he is being treated for a urinary tract infection.  She states it is normal for him to not remember things or be oriented but that he is typically able to ambulate around her home unassisted and does not typically have shaking.  No other acute concerns at this time.  Patient on provide any additional history secondary to dementia.   Past Medical History:  Diagnosis Date  . Acute kidney injury superimposed on CKD ([place]) 03/31/2020  . Acute metabolic encephalopathy 123456  . CAD (coronary artery disease) 07/19/2019  . Chronic diastolic CHF (congestive heart failure) ([place]) 03/31/2020  . CVA (cerebral vascular accident) ([place]) 10/03/2018  . Dementia without behavioral disturbance ([place])   . Diabetes mellitus without complication ([place])   . Encephalopathy acute 04/01/2020  . H/O right nephrectomy 07/19/2019  . High cholesterol   . History of CVA (cerebrovascular accident) 07/19/2019  . HLD (hyperlipidemia) 03/31/2020  . HTN (hypertension) 07/19/2019  . Hypertension   . Hypokalemia   . Left pontine stroke ([place])  07/19/2019  . Malnutrition of moderate degree 07/20/2019  . SIRS (systemic inflammatory response syndrome) ([place]) 03/31/2020  . Type 2 diabetes mellitus with stage 3 chronic kidney disease ([place]) 07/19/2019    Patient Active Problem List   Diagnosis Date Noted  . Seizure-like activity ([place]) 06/15/2020  . Hyperlipidemia associated with type 2 diabetes mellitus ([place]) 06/15/2020  . Dementia without behavioral disturbance ([place])   . Hypokalemia   . Encephalopathy acute 04/01/2020  . Acute metabolic encephalopathy 123456  . SIRS (systemic inflammatory response syndrome) ([place]) 03/31/2020  . Acute kidney injury superimposed on CKD ([place]) 03/31/2020  . HLD (hyperlipidemia) 03/31/2020  . Chronic diastolic CHF (congestive heart failure) ([place]) 03/31/2020  . Malnutrition of moderate degree 07/20/2019  . Dizziness 07/19/2019  . Unsteady gait when walking 07/19/2019  . History of CVA (cerebrovascular accident) 07/19/2019  . Type 2 diabetes mellitus with stage 3 chronic kidney disease ([place]) 07/19/2019  . CAD (coronary artery disease) 07/19/2019  . Hypertension associated with diabetes ([place]) 07/19/2019  . H/O right nephrectomy 07/19/2019  . 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 CVA (cerebral vascular accident) ([place]) 10/03/2018    Past Surgical History:  Procedure Laterality Date  . TEE WITHOUT CARDIOVERSION N/A 10/07/2018   Procedure: TRANSESOPHAGEAL ECHOCARDIOGRAM (TEE);  Surgeon: Teodoro Spray, MD;  Location: [facility] ORS;  Service: Cardiovascular;  Laterality: N/A;       Family History  Problem Relation Age of Onset  . Diabetes Mellitus II Mother   . Hypertension Mother   . Diabetes Mellitus  II Father   . Hypertension Father   . Prostate cancer Neg Hx   . Bladder Cancer Neg Hx   . Kidney cancer Neg Hx     Social History   Tobacco Use  . Smoking status: Current Every Day Smoker    Packs/day: 1.00    Years: 53.00    Pack years: 53.00    Types: Cigarettes  . Smokeless tobacco: Never  Used  Vaping Use  . Vaping Use: Never used  Substance Use Topics  . Alcohol use: Yes    Comment: last drink yesterday  . Drug use: Never    Home Medications Prior to Admission medications   Medication Sig Start Date End Date Taking? Authorizing Provider  amLODipine (NORVASC) 10 MG tablet Take 10 mg by mouth daily. 03/07/20  Yes [provider]  atorvastatin (LIPITOR) 80 MG tablet Take 80 mg by mouth daily.   Yes [provider]  clopidogrel (PLAVIX) 75 MG tablet Take 1 tablet (75 mg total) by mouth daily. 07/21/19  Yes Jennye Boroughs, MD  risperiDONE (RISPERDAL) 0.5 MG tablet Take 0.5 mg by mouth daily. 05/25/20  Yes [provider]  sertraline (ZOLOFT) 25 MG tablet Take 25 mg by mouth daily.   Yes [provider]  tamsulosin (FLOMAX) 0.4 MG CAPS capsule Take 0.4 mg by mouth 2 (two) times daily.   Yes [provider]  diltiazem (CARDIZEM) 120 MG tablet Take 1 tablet (120 mg total) by mouth 3 (three) times daily. 04/06/20   Loletha Grayer, MD  fluticasone (FLONASE) 50 MCG/ACT nasal spray Place 2 sprays into both nostrils daily. Patient not taking: Reported on 06/15/2020 04/06/20   Loletha Grayer, MD  insulin aspart (NOVOLOG) 100 UNIT/ML injection Inject 3 Units into the skin 3 (three) times daily with meals. Patient not taking: Reported on 06/15/2020 04/06/20   Loletha Grayer, MD  melatonin 5 MG TABS Take 1 tablet (5 mg total) by mouth at bedtime. Patient not taking: Reported on 06/15/2020 04/06/20   Loletha Grayer, MD  metoprolol succinate (TOPROL-XL) 25 MG 24 hr tablet Take 25 mg by mouth daily. Patient not taking: Reported on 06/15/2020    [provider]  metoprolol tartrate (LOPRESSOR) 25 MG tablet Take 1 tablet (25 mg total) by mouth 2 (two) times daily. Patient not taking: Reported on 06/15/2020 04/06/20   Loletha Grayer, MD  nicotine (NICODERM CQ - DOSED IN MG/24 HOURS) 21 mg/24hr patch 21 mg patch chest wall daily Patient not  taking: Reported on 06/15/2020 04/06/20   Loletha Grayer, MD  QUEtiapine (SEROQUEL) 50 MG tablet Take 50 mg by mouth at bedtime. Patient not taking: Reported on 06/15/2020 05/13/19   [provider]  Vitamin D, Ergocalciferol, (DRISDOL) 1.25 MG (50000 UNIT) CAPS capsule Take 50,000 Units by mouth once a week.    [provider]    Allergies    Patient has no known allergies.  Review of Systems   Review of Systems  Unable to perform ROS: Dementia  Neurological: Positive for tremors and weakness ( generalized, nothing focal ).    Physical Exam Updated Vital Signs BP (!) 148/79 (BP Location: Right Arm)   Pulse 85   Temp 98.1 F (36.7 C) (Oral)   Resp 20   Ht '5\' 9"'$  (1.753 m)   Wt 89.4 kg   SpO2 99%   BMI 29.09 kg/m   Physical Exam Vitals and nursing note reviewed.  Constitutional:      Appearance: He is well-developed and well-nourished.  HENT:     Head: Normocephalic and atraumatic.     Right Ear: External ear normal.     Left Ear: External ear normal.     Nose: Nose normal.  Eyes:     Conjunctiva/sclera: Conjunctivae normal.  Cardiovascular:     Rate and Rhythm: Normal rate and regular rhythm.     Heart sounds: No murmur heard.   Pulmonary:     Effort: Pulmonary effort is normal. No respiratory distress.     Breath sounds: Normal breath sounds.  Abdominal:     Palpations: Abdomen is soft.     Tenderness: There is no abdominal tenderness.  Musculoskeletal:        General: No edema.     Cervical back: Neck supple.  Skin:    General: Skin is warm and dry.  Neurological:     Mental Status: He is alert. He is disoriented and confused.  Psychiatric:        Mood and Affect: Mood and affect normal.    No finger dysmetria.  No pronator drift.  PERRLA.  EOMI.  Cranial nerves II through XII grossly intact.  Patient has symmetric grip strength in his bilateral upper extremities and is able to lift his right leg legs off the bed with symmetric strength.   Sensation is intact light touch of all extremities.  He states he is too shaky and feeling too weak to attempt to stand ambulate.  He does have resting tremor in the bile upper extremities that seems to intermittently worsen.  He is otherwise alert. ED Results / Procedures / Treatments   Labs (all labs ordered are listed, but only abnormal results are displayed) Labs Reviewed  CBC - Abnormal; Notable for the following components:      Result Value   RBC 3.43 (*)    Hemoglobin 11.0 (*)    HCT 33.2 (*)    All other components within normal limits  COMPREHENSIVE METABOLIC PANEL - Abnormal; Notable for the following components:   Glucose, Bld 342 (*)    BUN 27 (*)    Creatinine, Ser 1.88 (*)    Total Protein 6.1 (*)    Albumin 3.2 (*)    AST 14 (*)    GFR, Estimated 38 (*)    All other components within normal limits  URINALYSIS, COMPLETE (UACMP) WITH MICROSCOPIC - Abnormal; Notable for the following components:   Color, Urine YELLOW (*)    APPearance CLEAR (*)    Glucose, UA >=500 (*)    Protein, ur >=300 (*)    All other components within normal limits  TSH - Abnormal; Notable for the following components:   TSH 4.647 (*)    All other components within normal limits  CBG MONITORING, ED - Abnormal; Notable for the following components:   Glucose-Capillary 252 (*)    All other components within normal limits  TROPONIN I (HIGH SENSITIVITY) - Abnormal; Notable for the following components:   Troponin I (High Sensitivity) 36 (*)    All other components within normal limits  PROTIME-INR  APTT  DIFFERENTIAL  HEMOGLOBIN A1C  AMMONIA  CK  VITAMIN B12  TROPONIN I (HIGH SENSITIVITY)    EKG None Sinus rhythm with a first-degree AV block with a PR interval of 234, normal axis, otherwise unremarkable intervals and no clear evidence of acute ischemia.  Radiology CT HEAD WO CONTRAST  Result Date: 06/15/2020 CLINICAL DATA:  Slurred speech and weakness EXAM: CT HEAD WITHOUT CONTRAST  TECHNIQUE: Contiguous axial images  were obtained from the base of the skull through the vertex without intravenous contrast. COMPARISON:  MRI 04/02/2020, CT 03/31/2020 FINDINGS: Brain: Mild motion degradation. No acute territorial infarction, hemorrhage or intracranial mass. Atrophy. Moderate hypodensity in the white matter consistent with chronic small vessel ischemic change. Chronic lacunar infarcts within the thalamus, basal ganglia and right cerebellum. Stable ventricle size. Vascular: No hyperdense vessels. Vertebral and carotid vascular calcification Skull: Normal. Negative for fracture or focal lesion. Sinuses/Orbits: No acute finding. Other: None IMPRESSION: 1. No CT evidence for acute intracranial abnormality. 2. Atrophy and chronic small vessel ischemic changes of the white matter. Electronically Signed   By: Donavan Foil M.D.   On: 06/15/2020 18:28    Procedures .1-3 Lead EKG Interpretation Performed by: Lucrezia Starch, MD Authorized by: Lucrezia Starch, MD     Interpretation: normal     ECG rate assessment: normal     Rhythm: sinus rhythm     Ectopy: none     Conduction: normal       Medications Ordered in ED Medications  sodium chloride flush (NS) 0.9 % injection 3 mL (3 mLs Intravenous Not Given 06/15/20 1830)  insulin aspart (novoLOG) injection 0-15 Units (8 Units Subcutaneous Given 06/15/20 2128)  midazolam (VERSED) injection 1 mg (1 mg Intravenous Given 06/15/20 2145)  lactated ringers bolus 1,000 mL (1,000 mLs Intravenous New Bag/Given 06/15/20 2140)    ED Course  I have reviewed the triage vital signs and the nursing notes.  Pertinent labs & imaging results that were available during my care of the patient were reviewed by me and considered in my medical decision making (see chart for details).    MDM Rules/Calculators/A&P                          Patient presents with above-stated history exam accompanied by his wife for assessment of shaking and jerking in his  bilateral upper extremities as well as new weakness and inability to ambulate that she noticed around 11 AM when he woke.  He was reportedly in his usual state health when he went to bed last night without any significant tremors and able to ambulate unassisted.  He is confused and not oriented at baseline which patient's wife does confirm.  History is somewhat limited from patient secondary to his dementia.  On arrival he is afebrile and hemodynamically stable.  He does have significant tremors of bilateral upper extremities although otherwise has a nonfocal supine neuro exam.  He is able to answer yes/no questions and does not seem to be slurring his speech.  He denies any other acute symptoms.  Low suspicion for acute traumatic injury or toxic ingestion.  No focal deficits on exam to suggest CVA and CT head shows no evidence of acute intracranial process.  CBC shows no leukocytosis or acute anemia and given absence of fever and low suspicion for acute infectious process.  In addition patient and wife deny any respiratory symptoms and his lungs are clear bilaterally I would low suspicion for pneumonia or other acute infectious process.  UA does not appear infected.  CMP has evidence of hyperglycemia with a glucose of 442 and kidney function at baseline with a creatinine of 1.88 compared to 1.72 months ago.  No other significant electrolyte or metabolic derangements.  Troponin is 36 compared to 3811 months ago and given otherwise reassuring EKG with patient denying any pain Evalose patient for atypical present of ACS.  TSH  is slightly elevated at 4.6.  Of note patient did have evidence of urinary retention on bedside POCUS exam which showed greater than 300 cc of urine.  Foley placed.  Overall unclear etiology for patient's new weakness and inability to ambulate as well as tremors although given he was reportedly able to ambulate unassisted in the last 24 hours I will plan to admit to medicine for further  evaluation management.      Final Clinical Impression(s) / ED Diagnoses Final diagnoses:  Weakness  Urinary retention  Tremor    Rx / DC Orders ED Discharge Orders    None       Lucrezia Starch, MD 06/15/20 2328

## 2020-06-15 NOTE — H&P (Signed)
History and Physical    Steven Ingram TDD:[id number] DOB: March 03, 1951 DOA: 06/15/2020  PCP: [facility] [employer]  Patient coming from: Home  I have personally briefly reviewed patient's old medical records in [place]  Chief Complaint: Slurred speech, shaking/jerking movements, weakness  HPI: Steven Ingram is a 70 y.o. male with medical history significant for dementia, history of CVA, CKD stage III, s/p right nephrectomy (? RCC), CAD, chronic diastolic CHF (EF 62-37%, S2GB), T2DM, HTN, HLD, BPH who presents to the ED for evaluation of new onset of generalized weakness with shaking/jerking movements.  History is limited from patient due to dementia and is otherwise obtained from [place], chart review, and his wife at bedside.  Per wife, patient had new onset of whole body shaking motion with abnormal/slurred speech this morning.  He appeared to be drooling.  He has not been able to ambulate on his own.  She says he has chronic urinary incontinence.  She says he was recently started on an antibiotic to treat a UTI.  Patient reports occasional cough and current chills but otherwise denies any subjective fevers, chest pain, dyspnea, abdominal pain, dysuria, or diarrhea.  ED Course:  Initial vitals showed BP 140/72, pulse 95, RR 18, temp 98.5 F, SPO2 100% on room air.  Labs significant for BUN 27, creatinine 1.8) previously 1.55 on 05/11/2020), serum glucose 342, sodium 135, potassium 4.2, bicarb 24, AST 14, ALT 13, alk phos 39, total bilirubin 0.6, WBC 4.1, hemoglobin 11.0, platelets 215,000, TSH 4.647.  Troponin 36.  Per EDP, patient noted to have urinary retention per bladder scan and Foley catheter was placed while in the ED.  Urinalysis shows >500 glucose, negative ketones, >300 protein, negative nitrites, negative leukocytes, 0-5 RBCs and WBCs per hpf, no bacteria microscopy.  CT head without contrast is negative for evidence of acute intracranial abnormality.  Atrophy and  chronic small vessel ischemic changes of the white matter noted.  Patient was given 1 L LR, 1 mg IV Versed, and placed on sliding scale insulin.  The hospitalist service was consulted to admit for further evaluation and management.  Review of Systems:  All systems reviewed and are negative except as documented in history of present illness above.   Past Medical History:  Diagnosis Date  . Acute kidney injury superimposed on CKD ([place]) 03/31/2020  . Acute metabolic encephalopathy 15/17/6160  . CAD (coronary artery disease) 07/19/2019  . Chronic diastolic CHF (congestive heart failure) ([place]) 03/31/2020  . CVA (cerebral vascular accident) ([place]) 10/03/2018  . Dementia without behavioral disturbance ([place])   . Diabetes mellitus without complication ([place])   . Encephalopathy acute 04/01/2020  . H/O right nephrectomy 07/19/2019  . High cholesterol   . History of CVA (cerebrovascular accident) 07/19/2019  . HLD (hyperlipidemia) 03/31/2020  . HTN (hypertension) 07/19/2019  . Hypertension   . Hypokalemia   . Left pontine stroke ([place]) 07/19/2019  . Malnutrition of moderate degree 07/20/2019  . SIRS (systemic inflammatory response syndrome) ([place]) 03/31/2020  . Type 2 diabetes mellitus with stage 3 chronic kidney disease ([place]) 07/19/2019    Past Surgical History:  Procedure Laterality Date  . TEE WITHOUT CARDIOVERSION N/A 10/07/2018   Procedure: TRANSESOPHAGEAL ECHOCARDIOGRAM (TEE);  Surgeon: Teodoro Spray, MD;  Location: [facility] ORS;  Service: Cardiovascular;  Laterality: N/A;    Social History:  reports that he has been smoking cigarettes. He has a 53.00 pack-year smoking history. He has never used smokeless tobacco. He reports current alcohol use. He reports that he  does not use drugs.  No Known Allergies  Family History  Problem Relation Age of Onset  . Diabetes Mellitus II Mother   . Hypertension Mother   . Diabetes Mellitus II Father   . Hypertension Father   . Prostate cancer Neg Hx   .  Bladder Cancer Neg Hx   . Kidney cancer Neg Hx      Prior to Admission medications   Medication Sig Start Date End Date Taking? Authorizing Provider  amLODipine (NORVASC) 10 MG tablet Take 10 mg by mouth daily. 03/07/20   [provider]  atorvastatin (LIPITOR) 80 MG tablet Take 80 mg by mouth daily.    [provider]  clopidogrel (PLAVIX) 75 MG tablet Take 1 tablet (75 mg total) by mouth daily. 07/21/19   Jennye Boroughs, MD  diltiazem (CARDIZEM) 120 MG tablet Take 1 tablet (120 mg total) by mouth 3 (three) times daily. 04/06/20   Loletha Grayer, MD  fluticasone (FLONASE) 50 MCG/ACT nasal spray Place 2 sprays into both nostrils daily. 04/06/20   Loletha Grayer, MD  insulin aspart (NOVOLOG) 100 UNIT/ML injection Inject 3 Units into the skin 3 (three) times daily with meals. 04/06/20   Loletha Grayer, MD  melatonin 5 MG TABS Take 1 tablet (5 mg total) by mouth at bedtime. 04/06/20   Loletha Grayer, MD  metoprolol succinate (TOPROL-XL) 25 MG 24 hr tablet Take 25 mg by mouth daily.    [provider]  metoprolol tartrate (LOPRESSOR) 25 MG tablet Take 1 tablet (25 mg total) by mouth 2 (two) times daily. 04/06/20   Loletha Grayer, MD  nicotine (NICODERM CQ - DOSED IN MG/24 HOURS) 21 mg/24hr patch 21 mg patch chest wall daily 04/06/20   Loletha Grayer, MD  QUEtiapine (SEROQUEL) 50 MG tablet Take 50 mg by mouth at bedtime. 05/13/19   [provider]  sertraline (ZOLOFT) 25 MG tablet Take 25 mg by mouth daily.    [provider]  tamsulosin (FLOMAX) 0.4 MG CAPS capsule Take 0.4 mg by mouth daily.    [provider]  Vitamin D, Ergocalciferol, (DRISDOL) 1.25 MG (50000 UNIT) CAPS capsule Take 50,000 Units by mouth once a week.    [provider]    Physical Exam: Vitals:   06/15/20 1800 06/15/20 1801 06/15/20 2117 06/15/20 2221  BP: 140/72  (!) 147/85 (!) 148/79  Pulse: 75  89 85  Resp: 18  (!) 24 20  Temp: 98.5 F (36.9 C)   98.1 F (36.7 C)   TempSrc:   Oral   SpO2: 100%  100% 99%  Weight:  89.4 kg    Height:  5' 9"  (1.753 m)     Constitutional: Resting in bed, appears to be shivering, NAD, calm, comfortable Eyes: PERRL, lids and conjunctivae normal ENMT: Mucous membranes are moist. Posterior pharynx clear of any exudate or lesions.Normal dentition.  Neck: normal, supple, no masses. Respiratory: clear to auscultation bilaterally, no wheezing, no crackles. Normal respiratory effort. No accessory muscle use.  Cardiovascular: Regular rate and rhythm, no murmurs / rubs / gallops.  +1 nonpitting bilateral lower extremity edema, less than baseline per wife. 2+ pedal pulses. Abdomen: no tenderness, no masses palpated. Bowel sounds positive.  Musculoskeletal: no clubbing / cyanosis. No joint deformity upper and lower extremities.  Slow movement of extremities, good ROM, no contractures.  Skin: no rashes, lesions, ulcers. No induration Neurologic: CN 2-12 grossly intact. Sensation intact. Strength equal in all extremities with easy fatigability bilateral lower extremities.  Has jerking  movements of both upper extremities when raised in front of him. Psychiatric: Awake and alert to self, place, but not year.  Recognizes his spouse at bedside.  Labs on Admission: I have personally reviewed following labs and imaging studies  CBC: Recent Labs  Lab 06/15/20 1804  WBC 4.1  NEUTROABS 2.1  HGB 11.0*  HCT 33.2*  MCV 96.8  PLT 962   Basic Metabolic Panel: Recent Labs  Lab 06/15/20 1804  NA 135  K 4.2  CL 105  CO2 24  GLUCOSE 342*  BUN 27*  CREATININE 1.88*  CALCIUM 8.9   GFR: Estimated Creatinine Clearance: 40.4 mL/min (A) (by C-G formula based on SCr of 1.88 mg/dL (H)). Liver Function Tests: Recent Labs  Lab 06/15/20 1804  AST 14*  ALT 13  ALKPHOS 39  BILITOT 0.6  PROT 6.1*  ALBUMIN 3.2*   No results for input(s): LIPASE, AMYLASE in the last 168 hours. No results for input(s): AMMONIA in the  last 168 hours. Coagulation Profile: Recent Labs  Lab 06/15/20 1804  INR 1.0   Cardiac Enzymes: No results for input(s): CKTOTAL, CKMB, CKMBINDEX, TROPONINI in the last 168 hours. BNP (last 3 results) No results for input(s): PROBNP in the last 8760 hours. HbA1C: No results for input(s): HGBA1C in the last 72 hours. CBG: Recent Labs  Lab 06/15/20 2122  GLUCAP 252*   Lipid Profile: No results for input(s): CHOL, HDL, LDLCALC, TRIG, CHOLHDL, LDLDIRECT in the last 72 hours. Thyroid Function Tests: Recent Labs    06/15/20 1804  TSH 4.647*   Anemia Panel: No results for input(s): VITAMINB12, FOLATE, FERRITIN, TIBC, IRON, RETICCTPCT in the last 72 hours. Urine analysis:    Component Value Date/Time   COLORURINE YELLOW (A) 06/15/2020 2140   APPEARANCEUR CLEAR (A) 06/15/2020 2140   LABSPEC 1.016 06/15/2020 2140   PHURINE 5.0 06/15/2020 2140   GLUCOSEU >=500 (A) 06/15/2020 2140   HGBUR NEGATIVE 06/15/2020 2140   BILIRUBINUR NEGATIVE 06/15/2020 2140   KETONESUR NEGATIVE 06/15/2020 2140   PROTEINUR >=300 (A) 06/15/2020 2140   NITRITE NEGATIVE 06/15/2020 2140   LEUKOCYTESUR NEGATIVE 06/15/2020 2140    Radiological Exams on Admission: CT HEAD WO CONTRAST  Result Date: 06/15/2020 CLINICAL DATA:  Slurred speech and weakness EXAM: CT HEAD WITHOUT CONTRAST TECHNIQUE: Contiguous axial images were obtained from the base of the skull through the vertex without intravenous contrast. COMPARISON:  MRI 04/02/2020, CT 03/31/2020 FINDINGS: Brain: Mild motion degradation. No acute territorial infarction, hemorrhage or intracranial mass. Atrophy. Moderate hypodensity in the white matter consistent with chronic small vessel ischemic change. Chronic lacunar infarcts within the thalamus, basal ganglia and right cerebellum. Stable ventricle size. Vascular: No hyperdense vessels. Vertebral and carotid vascular calcification Skull: Normal. Negative for fracture or focal lesion. Sinuses/Orbits: No acute  finding. Other: None IMPRESSION: 1. No CT evidence for acute intracranial abnormality. 2. Atrophy and chronic small vessel ischemic changes of the white matter. Electronically Signed   By: Donavan Foil M.D.   On: 06/15/2020 18:28    EKG: Personally reviewed. Sinus rhythm, first-degree AV block, low voltage.  Similar to prior.  Assessment/Plan Principal Problem:   Acute kidney injury superimposed on CKD (HCC) Active Problems:   History of CVA (cerebrovascular accident)   Type 2 diabetes mellitus with stage 3 chronic kidney disease (HCC)   CAD (coronary artery disease)   Hypertension associated with diabetes ([place])   Chronic diastolic CHF (congestive heart failure) ([place])   Dementia without behavioral disturbance ([place])   Seizure-like activity ([place])  Hyperlipidemia associated with type 2 diabetes mellitus ([place])  Steven Ingram is a 70 y.o. male with medical history significant for dementia, history of CVA, CKD stage III, s/p right nephrectomy (? RCC), CAD, chronic diastolic CHF (EF 00-29%, K4RJ), T2DM, HTN, HLD, BPH who is admitted with AKI on CKD stage III.  AKI on CKD stage III: Baseline creatinine 1.5.  Creatinine 1.88 on admission.  Likely multifactorial from urinary retention and medication effect (taking lisinopril and Invokana per recent notes in Care Everywhere). -Received 1 L LR in ED -Foley catheter placed in ED -Hold lisinopril, Invokana, NSAIDs and nephrotoxic agents -Repeat labs in a.m.  ?Seizure-like activity/myoclonus/generalized weakness: New onset day of admission.  Questionable seizure-like activity versus myoclonus.  Could be related to uremia from AKI, medication effect (unspecified antibiotic for UTI), hyperglycemia, etc. -UA negative for UTI -TSH 4.647 -Obtain EEG -Check ammonia, CK, B12, magnesium and phosphorus levels -Seizure precautions -PT/OT eval  BPH with urinary retention: Foley catheter placed in the ED.  Continue Flomax.  Type 2 diabetes with  hyperglycemia: Without evidence of DKA or HHS.  Continue sensitive SSI.  History of CVA: Continue Plavix and atorvastatin.  CAD: Denies any chest pain.  Troponin minimally elevated at 36, repeat pending.  No acute changes on EKG.  Continue Plavix and atorvastatin.  Hypertension: Resume amlodipine.  Hyperlipidemia: Continue atorvastatin.  Chronic diastolic CHF: Appears stable without acute exacerbation.  Mild edema in lower extremities, this is decreased from his baseline per wife.  Not on diuretics as an outpatient.  Monitor strict I/O's.  Dementia/depression: Continue Risperdal, sertraline.:  Medication reconciliation: Current medications not clear at time of admission.  Patient's wife to return with up-to-date medication list in the morning including recent antibiotic.  DVT prophylaxis: Subcutaneous heparin Code Status: Full code, confirmed with patient and wife at bedside Family Communication: Discussed with patient's wife at bedside Disposition Plan: From home and likely discharged home pending clinical progress Consults called: None Level of care: Med-Surg Admission status:  Status is: Observation  The patient remains OBS appropriate and will d/c before 2 midnights.  Dispo: The patient is from: Home              Anticipated d/c is to: Home              Patient currently is not medically stable to d/c.    Zada Finders MD [facility]  If 7PM-7AM, please contact night-coverage [email or web address]  06/15/2020, 11:43 PM

## 2020-06-16 ENCOUNTER — Encounter: Payer: Self-pay | Admitting: Internal Medicine

## 2020-06-16 ENCOUNTER — Other Ambulatory Visit: Payer: Self-pay

## 2020-06-16 DIAGNOSIS — R569 Unspecified convulsions: Secondary | ICD-10-CM

## 2020-06-16 DIAGNOSIS — N179 Acute kidney failure, unspecified: Secondary | ICD-10-CM | POA: Diagnosis not present

## 2020-06-16 DIAGNOSIS — N189 Chronic kidney disease, unspecified: Secondary | ICD-10-CM | POA: Diagnosis not present

## 2020-06-16 LAB — CBG MONITORING, ED
Glucose-Capillary: 204 mg/dL — ABNORMAL HIGH (ref 70–99)
Glucose-Capillary: 226 mg/dL — ABNORMAL HIGH (ref 70–99)

## 2020-06-16 LAB — GLUCOSE, CAPILLARY
Glucose-Capillary: 157 mg/dL — ABNORMAL HIGH (ref 70–99)
Glucose-Capillary: 172 mg/dL — ABNORMAL HIGH (ref 70–99)

## 2020-06-16 LAB — PHOSPHORUS: Phosphorus: 3.4 mg/dL (ref 2.5–4.6)

## 2020-06-16 LAB — COMPREHENSIVE METABOLIC PANEL
ALT: 13 U/L (ref 0–44)
AST: 15 U/L (ref 15–41)
Albumin: 3.1 g/dL — ABNORMAL LOW (ref 3.5–5.0)
Alkaline Phosphatase: 38 U/L (ref 38–126)
Anion gap: 7 (ref 5–15)
BUN: 24 mg/dL — ABNORMAL HIGH (ref 8–23)
CO2: 25 mmol/L (ref 22–32)
Calcium: 8.9 mg/dL (ref 8.9–10.3)
Chloride: 107 mmol/L (ref 98–111)
Creatinine, Ser: 1.69 mg/dL — ABNORMAL HIGH (ref 0.61–1.24)
GFR, Estimated: 43 mL/min — ABNORMAL LOW (ref >60)
Glucose, Bld: 143 mg/dL — ABNORMAL HIGH (ref 70–99)
Potassium: 3.5 mmol/L (ref 3.5–5.1)
Sodium: 139 mmol/L (ref 135–145)
Total Bilirubin: 1 mg/dL (ref 0.3–1.2)
Total Protein: 6 g/dL — ABNORMAL LOW (ref 6.5–8.1)

## 2020-06-16 LAB — CBC
HCT: 33.7 % — ABNORMAL LOW (ref 39.0–52.0)
Hemoglobin: 11.4 g/dL — ABNORMAL LOW (ref 13.0–17.0)
MCH: 32.7 pg (ref 26.0–34.0)
MCHC: 33.8 g/dL (ref 30.0–36.0)
MCV: 96.6 fL (ref 80.0–100.0)
Platelets: 207 10*3/uL (ref 150–400)
RBC: 3.49 MIL/uL — ABNORMAL LOW (ref 4.22–5.81)
RDW: 11.7 % (ref 11.5–15.5)
WBC: 5.5 10*3/uL (ref 4.0–10.5)
nRBC: 0 % (ref 0.0–0.2)

## 2020-06-16 LAB — SARS CORONAVIRUS 2 (TAT 6-24 HRS): SARS Coronavirus 2: NEGATIVE

## 2020-06-16 LAB — AMMONIA: Ammonia: 13 umol/L (ref 9–35)

## 2020-06-16 LAB — HEMOGLOBIN A1C
Hgb A1c MFr Bld: 7.2 % — ABNORMAL HIGH (ref 4.8–5.6)
Mean Plasma Glucose: 159.94 mg/dL

## 2020-06-16 LAB — CK: Total CK: 130 U/L (ref 49–397)

## 2020-06-16 LAB — MAGNESIUM: Magnesium: 1.7 mg/dL (ref 1.7–2.4)

## 2020-06-16 LAB — TROPONIN I (HIGH SENSITIVITY): Troponin I (High Sensitivity): 44 ng/L — ABNORMAL HIGH (ref <18)

## 2020-06-16 LAB — VITAMIN B12: Vitamin B-12: 267 pg/mL (ref 180–914)

## 2020-06-16 MED ORDER — CHLORHEXIDINE GLUCONATE CLOTH 2 % EX PADS
6.0000 | MEDICATED_PAD | Freq: Every day | CUTANEOUS | Status: DC
  Administered 2020-06-16 – 2020-06-21 (×5): 6 via TOPICAL

## 2020-06-16 MED ORDER — DILTIAZEM HCL 60 MG PO TABS
60.0000 mg | ORAL_TABLET | Freq: Three times a day (TID) | ORAL | Status: DC
  Administered 2020-06-16 – 2020-06-21 (×16): 60 mg via ORAL
  Filled 2020-06-16 (×3): qty 2
  Filled 2020-06-16: qty 1
  Filled 2020-06-16 (×7): qty 2
  Filled 2020-06-16: qty 1
  Filled 2020-06-16 (×6): qty 2

## 2020-06-16 MED ORDER — HALOPERIDOL LACTATE 5 MG/ML IJ SOLN
1.0000 mg | Freq: Four times a day (QID) | INTRAMUSCULAR | Status: DC | PRN
  Administered 2020-06-19: 1 mg via INTRAMUSCULAR
  Filled 2020-06-16: qty 1

## 2020-06-16 NOTE — Progress Notes (Signed)
OT Cancellation Note  Patient Details Name: Steven Ingram MRN: [id number] DOB: [birth year]   Cancelled Treatment:    Reason Eval/Treat Not Completed: Patient at procedure or test/ unavailable  OT consult received and chart reviewed. Pt OTF to EEG at this time. Will f/u for OT evaluation at later date/time as able/pt available. Thank you.  Gerrianne Scale, [place], OTR/L ascom [phone] 06/16/20, 4:32 PM

## 2020-06-16 NOTE — ED Notes (Signed)
Pt to be transported by [employer] at this time

## 2020-06-16 NOTE — ED Notes (Signed)
Pt continues to have intermittent tremors and muscle spasms. Pt remains alert and answering questions throughout RN assessment.  Pt oriented to person and place but states the year is 2021 and does not know why he is in the hospital. Told RN that he "had to get right inside" due to "things he saw in the sand."

## 2020-06-16 NOTE — Procedures (Signed)
Patient Name: Steven Ingram  MRN: [id number]  Epilepsy Attending: Lora Havens  Referring Physician/Provider: Dr Zada Finders Date: 06/16/2020 Duration: 33.04 mins  Patient history: 70yo M with seizure like activity. EEG to evaluate for seizure  Level of alertness: Awake  AEDs during EEG study: None  Technical aspects: This EEG study was done with scalp electrodes positioned according to the 10-20 International system of electrode placement. Electrical activity was acquired at a sampling rate of '500Hz'$  and reviewed with a high frequency filter of '70Hz'$  and a low frequency filter of '1Hz'$ . EEG data were recorded continuously and digitally stored.   Description: The posterior dominant rhythm consists of 9 Hz activity of moderate voltage (25-35 uV) seen predominantly in posterior head regions, symmetric and reactive to eye opening and eye closing. Physiologic photic driving was not seen during photic stimulation.  Hyperventilation was not performed.     IMPRESSION: This study is within normal limits. No seizures or epileptiform discharges were seen throughout the recording.  Steven Ingram Barbra Sarks

## 2020-06-16 NOTE — ED Notes (Signed)
Pt given breakfast tray

## 2020-06-16 NOTE — ED Notes (Signed)
Pt cleaned up after spilling beverage on self. Pt given new clean gown and warm blanket at this time. Pt denies further needs at this time

## 2020-06-16 NOTE — Plan of Care (Signed)

## 2020-06-16 NOTE — Progress Notes (Signed)
PROGRESS NOTE  Grantham Bixler [id number] DOB: 11/24/1950 DOA: 06/15/2020 PCP: [facility] [employer]  Brief History   The patient is a 70 yr old man who presented to [facility] ED from home with complaints of new onset generalized weakness with shaking and jerks. The patient is demented at baseline. 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 Changes were noted on the morning of 3/2/20222. CT head negative for evidence of acute intracranial abnormality.   [facility] have been consulted to admit the patient for further evaluation and treatment. EEG has been ordered. PT/OT has been ordered. He is on seizure precautions. He has been admitted under observation status.   Consultants  . None  Procedures  . EEG pending.  Antibiotics   Anti-infectives (From admission, onward)   None    .  Subjective  The patient is lying on a gurney. He is being roomed in the ED awaiting a bed upstairs. He is shivering. I ask if he is cold and he says yes. I adjust his blankets so that he is better covered. He says "thank you". Otherwise no new complaints.   Objective   Vitals:  Vitals:   06/16/20 1300 06/16/20 1404  BP: (!) 141/73 (!) 151/78  Pulse: 88 83  Resp: 18 18  Temp:  97.6 F (36.4 C)  SpO2: 99% 100%   Exam:  Constitutional:  . The patient is awake. He is not oriented and pauci-verval. No acute distress other than shivering from cold. Respiratory:  . No increased work of breathing. . No wheezes, rales, or rhonchi . No tactile fremitus Cardiovascular:  . Regular rate and rhythm . No murmurs, ectopy, or gallups. . No lateral PMI. No thrills. Abdomen:  . Abdomen is soft, non-tender, non-distended . No hernias, masses, or organomegaly . Normoactive bowel sounds.  Musculoskeletal:  . No cyanosis, clubbing, or edema Skin:  . No rashes, lesions, ulcers . palpation of skin: no  induration or nodules Neurologic:  . Pt is unable to cooperate with exam.  Psychiatric:  . Pt is unable to cooperate with exam.  I have personally reviewed the following:   Today's Data  . Vitals, CMP, CBC, ammonia, troponin, UA   Micro Data  . UA negative for infection  Imaging  . CT head without contrast  Cardiology Data  . EKG Sinus rhythm with 1st degree AVB  Scheduled Meds: . amLODipine  10 mg Oral Daily  . atorvastatin  80 mg Oral q1800  . clopidogrel  75 mg Oral Daily  . diltiazem  60 mg Oral Q8H  . heparin  5,000 Units Subcutaneous Q8H  . insulin aspart  0-9 Units Subcutaneous TID WC  . risperiDONE  0.5 mg Oral Daily  . sertraline  25 mg Oral Daily  . sodium chloride flush  3 mL Intravenous Once  . sodium chloride flush  3 mL Intravenous Q12H  . tamsulosin  0.4 mg Oral BID   Continuous Infusions:  Principal Problem:   Acute kidney injury superimposed on CKD (HCC) Active Problems:   History of CVA (cerebrovascular accident)   Type 2 diabetes mellitus with stage 3 chronic kidney disease (HCC)   CAD (coronary artery disease)   Hypertension associated with diabetes ([place])   Chronic diastolic CHF (congestive heart failure) (HCC)   Dementia without behavioral disturbance ([place])   Seizure-like activity ([place])   Hyperlipidemia associated with type 2  diabetes mellitus ([place])   LOS: 0 days   A & P  Acute kidney injury superimposed on CKD (HCC) Active Problems:   History of CVA (cerebrovascular accident)   Type 2 diabetes mellitus with stage 3 chronic kidney disease (HCC)   CAD (coronary artery disease)   Hypertension associated with diabetes ([place])   Chronic diastolic CHF (congestive heart failure) (HCC)   Dementia without behavioral disturbance ([place])   Seizure-like activity ([place])   Hyperlipidemia associated with type 2 diabetes mellitus ([place])  Tydarius Minas is a 70 y.o. male with medical history significant for dementia, history of CVA, CKD stage III, s/p  right nephrectomy (? RCC), CAD, chronic diastolic CHF (EF 0000000, [id number]), T2DM, HTN, HLD, BPH who is admitted with AKI on CKD stage III.  AKI on CKD stage III: Baseline creatinine 1.5.  Creatinine 1.88 on admission. 1.69 this am. Likely multifactorial from urinary retention and medication effect (taking lisinopril and Invokana per recent notes in Care Everywhere). -Received 1 L LR in ED -Foley catheter placed in ED -Hold lisinopril, Invokana, NSAIDs and nephrotoxic agents Monitor electrolytes, creatinine and volume status.  Seizure-like activity/myoclonus/generalized weakness: New onset day of admission.  Questionable seizure-like activity versus myoclonus.  Could be related to uremia from AKI, medication effect (unspecified antibiotic for UTI), hyperglycemia, etc.  Pt is on seizure precautions. PT/OT to eval and treat. EEG is pending.   BPH with urinary retention: Foley catheter placed in the ED.  Continue Flomax.  Type 2 diabetes with hyperglycemia: Without evidence of DKA or HHS.  Continue sensitive SSI. Pt is on renal/ carb modified diet.  History of CVA: Continue Plavix and atorvastatin.  CAD: Denies any chest pain.  Troponin minimally elevated at 36, repeat pending.  No acute changes on EKG.  Continue Plavix and atorvastatin.  Hypertension: Resume amlodipine.  Hyperlipidemia: Continue atorvastatin.  Chronic diastolic CHF: Appears stable without acute exacerbation.  Mild edema in lower extremities, this is decreased from his baseline per wife.  Not on diuretics as an outpatient.  Monitor strict I/O's.  Dementia/depression: Continue Risperdal, sertraline.  I have seen and examined this patient myself. I have spent 36 minutes in his evaluation and care.  DVT prophylaxis: Subcutaneous heparin Code Status: Full code, confirmed with patient and wife at bedside Family Communication: None available. Disposition Plan:  Status is: Observation  The patient remains OBS  appropriate and will d/c before 2 midnights.  Dispo: The patient is from: Home              Anticipated d/c is to: Home              Patient currently is not medically stable to d/c.   Difficult to place patient No   Ava Swayze, DO [facility] Direct contact: see [email or web address]  7PM-7AM contact night coverage as above 06/16/2020, 3:45 PM  LOS: 0 days

## 2020-06-16 NOTE — Progress Notes (Signed)
eeg done °

## 2020-06-16 NOTE — Evaluation (Signed)
Physical Therapy Evaluation Patient Details Name: Steven Ingram MRN: [id number] DOB: Feb 02, 1951 Today's Date: 06/16/2020   History of Present Illness  Patient is a 70 y.o. male with medical history significant for dementia, history of CVA, CKD stage III, s/p right nephrectomy, CAD, chronic diastolic CHF (EF 0000000,, T2DM, HTN, HLD, BPH who presents to the ED for evaluation of new onset of generalized weakness with shaking/jerking movements with with abnormal/slurred speech . No CT evidence for acute intracranial abnormality per report.  Clinical Impression  PT evaluation completed. Patient confused but able to follow single step commands. Oriented to self only. Patient has mild jerking movements noted mostly in BUE at rest and with movement. Patient required assistance for bed mobility, standing, and has poor standing balance. Recommend PT to maximize independence and facilitate return to PLOF. Recommend SNF at discharge.      Follow Up Recommendations SNF    Equipment Recommendations   (to be determined at next level of care)    Recommendations for Other Services       Precautions / Restrictions Precautions Precautions: Fall Restrictions Weight Bearing Restrictions: No      Mobility  Bed Mobility Overal bed mobility: Needs Assistance Bed Mobility: Supine to Sit;Sit to Supine     Supine to sit: Mod assist Sit to supine: Mod assist   General bed mobility comments: verbal cues for sequencing and task initiation    Transfers Overall transfer level: Needs assistance   Transfers: Sit to/from Stand Sit to Stand: Mod assist         General transfer comment: lifting and lowering assistance provided with standing  Ambulation/Gait             General Gait Details: not attempted due to poor standing tolerance  Stairs            Wheelchair Mobility    Modified Rankin (Stroke Patients Only)       Balance Overall balance assessment: Needs  assistance Sitting-balance support: Feet supported Sitting balance-Leahy Scale: Fair     Standing balance support: No upper extremity supported Standing balance-Leahy Scale: Poor Standing balance comment: Min A- Mod A to maintain balance with posterior lean                             Pertinent Vitals/Pain Pain Assessment: No/denies pain    Home Living Family/patient expects to be discharged to:: Private residence                 Additional Comments: patient unable to provide information due to mental status at this time. patient states he was walking prior to admission    Prior Function Level of Independence: Independent with assistive device(s)         Comments: using rolling walker per patient report     Hand Dominance        Extremity/Trunk Assessment   Upper Extremity Assessment Upper Extremity Assessment: Generalized weakness    Lower Extremity Assessment Lower Extremity Assessment: Generalized weakness       Communication   Communication: No difficulties  Cognition Arousal/Alertness: Awake/alert Behavior During Therapy: WFL for tasks assessed/performed Overall Cognitive Status: No family/caregiver present to determine baseline cognitive functioning                                 General Comments: patient able to follow single step commands with extra time. patient  oriented to self only      General Comments      Exercises     Assessment/Plan    PT Assessment Patient needs continued PT services  PT Problem List Decreased strength;Decreased activity tolerance;Decreased balance;Decreased mobility;Decreased cognition;Decreased knowledge of use of DME;Decreased safety awareness       PT Treatment Interventions DME instruction;Gait training;Stair training;Functional mobility training;Therapeutic activities;Therapeutic exercise;Neuromuscular re-education;Balance training    PT Goals (Current goals can be found in the  Care Plan section)  Acute Rehab PT Goals Patient Stated Goal: none stated PT Goal Formulation: Patient unable to participate in goal setting Time For Goal Achievement: 06/30/20 Potential to Achieve Goals: Fair    Frequency Min 2X/week   Barriers to discharge        Co-evaluation               AM-PAC PT "6 Clicks" Mobility  Outcome Measure Help needed turning from your back to your side while in a flat bed without using bedrails?: A Lot Help needed moving from lying on your back to sitting on the side of a flat bed without using bedrails?: A Lot Help needed moving to and from a bed to a chair (including a wheelchair)?: A Lot Help needed standing up from a chair using your arms (e.g., wheelchair or bedside chair)?: A Lot Help needed to walk in hospital room?: A Lot Help needed climbing 3-5 steps with a railing? : A Lot 6 Click Score: 12    End of Session   Activity Tolerance: Patient tolerated treatment well Patient left: in bed;with call bell/phone within reach   PT Visit Diagnosis: Muscle weakness (generalized) (M62.81);Unsteadiness on feet (R26.81);Difficulty in walking, not elsewhere classified (R26.2)    Time: 1335-1350 PT Time Calculation (min) (ACUTE ONLY): 15 min   Charges:   PT Evaluation $PT Eval Moderate Complexity: 1 Mod PT Treatments $Therapeutic Activity: 8-22 mins       Minna Merritts, PT, MPT   Percell Locus 06/16/2020, 3:46 PM

## 2020-06-17 DIAGNOSIS — G9341 Metabolic encephalopathy: Secondary | ICD-10-CM | POA: Diagnosis not present

## 2020-06-17 DIAGNOSIS — N189 Chronic kidney disease, unspecified: Secondary | ICD-10-CM | POA: Diagnosis not present

## 2020-06-17 DIAGNOSIS — Z20822 Contact with and (suspected) exposure to covid-19: Secondary | ICD-10-CM | POA: Diagnosis not present

## 2020-06-17 DIAGNOSIS — Z8673 Personal history of transient ischemic attack (TIA), and cerebral infarction without residual deficits: Secondary | ICD-10-CM | POA: Diagnosis not present

## 2020-06-17 DIAGNOSIS — Z8249 Family history of ischemic heart disease and other diseases of the circulatory system: Secondary | ICD-10-CM | POA: Diagnosis not present

## 2020-06-17 DIAGNOSIS — Z905 Acquired absence of kidney: Secondary | ICD-10-CM | POA: Diagnosis not present

## 2020-06-17 DIAGNOSIS — R4181 Age-related cognitive decline: Secondary | ICD-10-CM | POA: Diagnosis not present

## 2020-06-17 DIAGNOSIS — I2510 Atherosclerotic heart disease of native coronary artery without angina pectoris: Secondary | ICD-10-CM | POA: Diagnosis not present

## 2020-06-17 DIAGNOSIS — I152 Hypertension secondary to endocrine disorders: Secondary | ICD-10-CM | POA: Diagnosis not present

## 2020-06-17 DIAGNOSIS — I130 Hypertensive heart and chronic kidney disease with heart failure and stage 1 through stage 4 chronic kidney disease, or unspecified chronic kidney disease: Secondary | ICD-10-CM | POA: Diagnosis not present

## 2020-06-17 DIAGNOSIS — E1165 Type 2 diabetes mellitus with hyperglycemia: Secondary | ICD-10-CM | POA: Diagnosis not present

## 2020-06-17 DIAGNOSIS — Z87440 Personal history of urinary (tract) infections: Secondary | ICD-10-CM | POA: Diagnosis not present

## 2020-06-17 DIAGNOSIS — R531 Weakness: Secondary | ICD-10-CM | POA: Diagnosis present

## 2020-06-17 DIAGNOSIS — E785 Hyperlipidemia, unspecified: Secondary | ICD-10-CM | POA: Diagnosis not present

## 2020-06-17 DIAGNOSIS — F17210 Nicotine dependence, cigarettes, uncomplicated: Secondary | ICD-10-CM | POA: Diagnosis not present

## 2020-06-17 DIAGNOSIS — R2681 Unsteadiness on feet: Secondary | ICD-10-CM | POA: Diagnosis not present

## 2020-06-17 DIAGNOSIS — I5032 Chronic diastolic (congestive) heart failure: Secondary | ICD-10-CM | POA: Diagnosis not present

## 2020-06-17 DIAGNOSIS — F0390 Unspecified dementia without behavioral disturbance: Secondary | ICD-10-CM | POA: Diagnosis not present

## 2020-06-17 DIAGNOSIS — E1122 Type 2 diabetes mellitus with diabetic chronic kidney disease: Secondary | ICD-10-CM | POA: Diagnosis not present

## 2020-06-17 DIAGNOSIS — M6281 Muscle weakness (generalized): Secondary | ICD-10-CM | POA: Diagnosis not present

## 2020-06-17 DIAGNOSIS — N401 Enlarged prostate with lower urinary tract symptoms: Secondary | ICD-10-CM | POA: Diagnosis not present

## 2020-06-17 DIAGNOSIS — G253 Myoclonus: Secondary | ICD-10-CM | POA: Diagnosis not present

## 2020-06-17 DIAGNOSIS — R32 Unspecified urinary incontinence: Secondary | ICD-10-CM | POA: Diagnosis not present

## 2020-06-17 DIAGNOSIS — E1169 Type 2 diabetes mellitus with other specified complication: Secondary | ICD-10-CM | POA: Diagnosis not present

## 2020-06-17 DIAGNOSIS — N179 Acute kidney failure, unspecified: Secondary | ICD-10-CM | POA: Diagnosis not present

## 2020-06-17 DIAGNOSIS — Z7902 Long term (current) use of antithrombotics/antiplatelets: Secondary | ICD-10-CM | POA: Diagnosis not present

## 2020-06-17 DIAGNOSIS — Z833 Family history of diabetes mellitus: Secondary | ICD-10-CM | POA: Diagnosis not present

## 2020-06-17 DIAGNOSIS — R338 Other retention of urine: Secondary | ICD-10-CM | POA: Diagnosis not present

## 2020-06-17 DIAGNOSIS — N1831 Chronic kidney disease, stage 3a: Secondary | ICD-10-CM | POA: Diagnosis not present

## 2020-06-17 LAB — GLUCOSE, CAPILLARY
Glucose-Capillary: 167 mg/dL — ABNORMAL HIGH (ref 70–99)
Glucose-Capillary: 215 mg/dL — ABNORMAL HIGH (ref 70–99)
Glucose-Capillary: 219 mg/dL — ABNORMAL HIGH (ref 70–99)
Glucose-Capillary: 195 mg/dL — ABNORMAL HIGH (ref 70–99)

## 2020-06-17 NOTE — Evaluation (Signed)
Occupational Therapy Evaluation Patient Details Name: Steven Ingram MRN: [id number] DOB: 05/23/1950 Today's Date: 06/17/2020    History of Present Illness Patient is a 70 y.o. male with medical history significant for dementia, history of CVA, CKD stage III, s/p right nephrectomy, CAD, chronic diastolic CHF (EF 99-37%,, T2DM, HTN, HLD, BPH who presents to the ED for evaluation of new onset of generalized weakness with shaking/jerking movements with with abnormal/slurred speech . No CT evidence for acute intracranial abnormality per report.   Clinical Impression   Pt seen for OT evaluation this date in setting of acute hospitalization d/t AKI. Pt's spouse reports that she does have to help with some of his LB ADLs including bathing and dressing at home at baseline, but pt is otherwise able to care for himself and perform fxl mobility. Pt presents this date with some confusion and very few verbalizations with therapist. Pt with general weakness and decreased fxl activity tolerance impacting pt's ability to complete ADLs/ADL mobility safely. This date, pt requires MIN A for seated UB ADLs and MOD/MAX A for seated LB ADLs. Pt requires MIN A for ADL transfers with RW and cues for safety/sequencing throughout. Pt left in chair with posey belt alarm and all needs met/in reach. RN aware of contents of OT session. Will continue to follow acutely and anticipate that pt will require STR in SNF setting upon d/c from acute setting.     Follow Up Recommendations  SNF    Equipment Recommendations  3 in 1 bedside commode;Tub/shower seat    Recommendations for Other Services       Precautions / Restrictions Precautions Precautions: Fall Restrictions Weight Bearing Restrictions: No      Mobility Bed Mobility Overal bed mobility: Needs Assistance Bed Mobility: Supine to Sit     Supine to sit: Min assist;Mod assist Sit to supine: Mod assist   General bed mobility comments: verbal cues for sequencing  and task initiation    Transfers Overall transfer level: Needs assistance Equipment used: Rolling walker (2 wheeled) Transfers: Sit to/from Stand Sit to Stand: Min assist;From elevated surface         General transfer comment: increased time and assist initially, but progresses to MIN A with subsequent trials. Does however, require MIN/MOD A From chair as the surface is lower.    Balance Overall balance assessment: Needs assistance Sitting-balance support: Feet supported Sitting balance-Leahy Scale: Fair     Standing balance support: No upper extremity supported Standing balance-Leahy Scale: Poor Standing balance comment: Min A- Mod A to maintain balance with posterior lean                           ADL either performed or assessed with clinical judgement   ADL                                         General ADL Comments: requires MIN A for seated UB ADLs and MOD/MAX A for seated LB ADLs. Pt requires MIN A for ADL transfers with RW and cues for safety/sequencing throughout     Vision Patient Visual Report: No change from baseline       Perception     Praxis      Pertinent Vitals/Pain Pain Assessment: No/denies pain     Hand Dominance Left   Extremity/Trunk Assessment Upper Extremity Assessment Upper Extremity Assessment: Generalized  weakness   Lower Extremity Assessment Lower Extremity Assessment: Generalized weakness       Communication Communication Communication: No difficulties   Cognition Arousal/Alertness: Awake/alert Behavior During Therapy: WFL for tasks assessed/performed Overall Cognitive Status: No family/caregiver present to determine baseline cognitive functioning                                 General Comments: Pt's spouse present at beginning of session, but leaves before prior cognition can be discussed. Pt is able to appropriate follow commands, but does demo confusion and is only oriented to  self and "hospital" not which hospital, which town, situation or any temporal concepts.   General Comments       Exercises Other Exercises Other Exercises: OT educates pt and spouse re: role of OT in acute setting, safety considerations, f/u considerations and needs.   Shoulder Instructions      Home Living Family/patient expects to be discharged to:: Private residence Living Arrangements: Spouse/significant other Available Help at Discharge: Family;Available 24 hours/day Type of Home: House Home Access: Stairs to enter [employer] of Steps: 3-4 Entrance Stairs-Rails: Right;Left;Can reach both Home Layout: Laundry or work area in basement;One level     Bathroom Shower/Tub: Tub/shower unit;Door   Bathroom Toilet: Standard     Home Equipment: Environmental consultant - 2 wheels   Additional Comments: Pt is questionable historian d/t confusion. Most PLOF gathered from prior evaluations. Will f/u with pt's spouse as she is supportive and visits regularly.      Prior Functioning/Environment Level of Independence: Independent with assistive device(s)        Comments: using rolling walker per patient report        OT Problem List: Decreased strength;Decreased range of motion;Decreased activity tolerance;Impaired balance (sitting and/or standing);Decreased cognition;Decreased safety awareness;Decreased knowledge of use of DME or AE      OT Treatment/Interventions: Self-care/ADL training;DME and/or AE instruction;Therapeutic activities;Balance training;Therapeutic exercise;Energy conservation;Patient/family education    OT Goals(Current goals can be found in the care plan section) Acute Rehab OT Goals Patient Stated Goal: none stated OT Goal Formulation: With patient Time For Goal Achievement: 07/01/20 Potential to Achieve Goals: Fair ADL Goals Pt Will Perform Lower Body Dressing: with min assist;sitting/lateral leans (with decreased burden to caregiver, use of AE PRN) Pt Will  Transfer to Toilet: with min guard assist;with supervision;ambulating (LRAD to commode in restroom with BSC over to elevate as needed to simulate fxl HH distance) Pt Will Perform Toileting - Clothing Manipulation and hygiene: with supervision;with min guard assist;sit to/from stand Pt/caregiver will Perform Home Exercise Program: Increased strength;Both right and left upper extremity;With minimal assist  OT Frequency: Min 1X/week   Barriers to D/C:            Co-evaluation              AM-PAC OT "6 Clicks" Daily Activity     Outcome Measure Help from another person eating meals?: A Little Help from another person taking care of personal grooming?: A Little Help from another person toileting, which includes using toliet, bedpan, or urinal?: A Lot Help from another person bathing (including washing, rinsing, drying)?: A Lot Help from another person to put on and taking off regular upper body clothing?: A Little Help from another person to put on and taking off regular lower body clothing?: A Lot 6 Click Score: 15   End of Session Equipment Utilized During Treatment: Gait belt;Rolling walker Nurse  Communication: Mobility status  Activity Tolerance: Patient tolerated treatment well Patient left: in chair;with call bell/phone within reach;with chair alarm set  OT Visit Diagnosis: Unsteadiness on feet (R26.81);Muscle weakness (generalized) (M62.81);Other symptoms and signs involving cognitive function                Time: 3557-3220 OT Time Calculation (min): 61 min Charges:  OT General Charges $OT Visit: 1 Visit OT Evaluation $OT Eval Moderate Complexity: 1 Mod OT Treatments $Self Care/Home Management : 23-37 mins $Therapeutic Activity: 23-37 mins  Gerrianne Scale, MS, OTR/L ascom [phone] 06/17/20, 7:28 PM

## 2020-06-17 NOTE — TOC Initial Note (Addendum)
Transition of Care [facility]) - Initial/Assessment Note    Patient Details  Name: Steven Ingram MRN: [id number] Date of Birth: 05-02-1950  Transition of Care [facility]) CM/SW Contact:    Magnus Ivan, LCSW Phone Number: 06/17/2020, 9:12 AM  Clinical Narrative:                Patient is disoriented. CSW spoke with patient's wife Davis Mikita via phone. Patient lives with his wife who provides transportation. PCP is [facility]. [facility]acy [facility] [place]. Patient went to [facility] for short term rehab in December for 2-3 weeks. Patient is active with [facility]. Patient has a RW and a shower chair at home. Patient has had both COVID vaccines and the booster shot. [name] is agreeable to SNF recommendation, she reported she goes to dialysis so would not be able to provide supervision needed until patient completes rehab. She would like to see what bed offers patient gets then choose SNF from there. CSW is starting SNF work up.   Expected Discharge Plan: Skilled Nursing Facility Barriers to Discharge: Continued Medical Work up   Patient Goals and CMS Choice Patient states their goals for this hospitalization and ongoing recovery are:: SNF rehab CMS Medicare.gov Compare Post Acute Care list provided to:: Patient Represenative (must comment) Choice offered to / list presented to : Spouse  Expected Discharge Plan and Services Expected Discharge Plan: [place]       Living arrangements for the past 2 months: Single Family Home                                      Prior Living Arrangements/Services Living arrangements for the past 2 months: Single Family Home Lives with:: Spouse Patient language and need for interpreter reviewed:: Yes Do you feel safe going back to the place where you live?: Yes      Need for Family Participation in Patient Care: Yes (Comment) Care giver support system in place?: Yes (comment) Current home  services: DME Criminal Activity/Legal Involvement Pertinent to Current Situation/Hospitalization: No - Comment as needed  Activities of Daily Living Home Assistive Devices/Equipment: None ADL Screening (condition at time of admission) Patient's cognitive ability adequate to safely complete daily activities?: No Is the patient deaf or have difficulty hearing?: No Does the patient have difficulty seeing, even when wearing glasses/contacts?: No Does the patient have difficulty concentrating, remembering, or making decisions?: No Patient able to express need for assistance with ADLs?: No Does the patient have difficulty dressing or bathing?: Yes Independently performs ADLs?: No Communication: Dependent Is this a change from baseline?: Pre-admission baseline Dressing (OT): Dependent Is this a change from baseline?: Pre-admission baseline Grooming: Dependent Is this a change from baseline?: Pre-admission baseline Feeding: Independent Bathing: Dependent Is this a change from baseline?: Pre-admission baseline Toileting: Dependent Is this a change from baseline?: Pre-admission baseline In/Out Bed: Dependent Is this a change from baseline?: Pre-admission baseline Walks in Home: Needs assistance Is this a change from baseline?: Pre-admission baseline Does the patient have difficulty walking or climbing stairs?: Yes Weakness of Legs: Both Weakness of Arms/Hands: Both  Permission Sought/Granted Permission sought to share information with : Facility Art therapist granted to share information with : Yes, Verbal Permission Granted (by spouse Randye Lobo)     Permission granted to share info w AGENCY: SNFs  Emotional Assessment       Orientation: : Fluctuating Orientation (Suspected and/or reported Sundowners) Alcohol / Substance Use: Not Applicable Psych Involvement: No (comment)  Admission diagnosis:  Urinary retention [R33.9] Tremor [R25.1] Weakness  [R53.1] Seizure-like activity ([place]) [R56.9] Patient Active Problem List   Diagnosis Date Noted  . Seizure-like activity ([place]) 06/15/2020  . Hyperlipidemia associated with type 2 diabetes mellitus ([place]) 06/15/2020  . Dementia without behavioral disturbance ([place])   . Hypokalemia   . Encephalopathy acute 04/01/2020  . Acute metabolic encephalopathy 123456  . SIRS (systemic inflammatory response syndrome) ([place]) 03/31/2020  . Acute kidney injury superimposed on CKD ([place]) 03/31/2020  . HLD (hyperlipidemia) 03/31/2020  . Chronic diastolic CHF (congestive heart failure) ([place]) 03/31/2020  . Malnutrition of moderate degree 07/20/2019  . Dizziness 07/19/2019  . Unsteady gait when walking 07/19/2019  . History of CVA (cerebrovascular accident) 07/19/2019  . Type 2 diabetes mellitus with stage 3 chronic kidney disease ([place]) 07/19/2019  . CAD (coronary artery disease) 07/19/2019  . Hypertension associated with diabetes ([place]) 07/19/2019  . H/O right nephrectomy 07/19/2019  . Left pontine stroke ([place]) 07/19/2019  . CVA (cerebral vascular accident) ([place]) 10/03/2018   PCP:  [facility], [place]:   [id number] [place] [id number]: [phone]: [phone]    Social Determinants of Health (SDOH) Interventions    Readmission Risk Interventions Readmission Risk Prevention Plan 10/04/2018  Post Dischage Appt Not Complete  Medication Screening Complete  Transportation Screening Complete

## 2020-06-17 NOTE — Progress Notes (Signed)
Physical Therapy Treatment Patient Details Name: Steven Ingram MRN: [id number] DOB: 1950/04/28 Today's Date: 06/17/2020    History of Present Illness Patient is a 70 y.o. male with medical history significant for dementia, history of CVA, CKD stage III, s/p right nephrectomy, CAD, chronic diastolic CHF (EF 0000000,, T2DM, HTN, HLD, BPH who presents to the ED for evaluation of new onset of generalized weakness with shaking/jerking movements with with abnormal/slurred speech . No CT evidence for acute intracranial abnormality per report.    PT Comments     Pt was long sitting in bed upon arriving. He agrees to PT session and is cooperative and pleasant. Does have baseline cognition deficits and per supportive spouse (present) he is not at baseline with functional mobility. He was able to exit L side of bed, stand, and ambulate with RW 100 ft. Does require min assist throughout gait training due to poor gait kinematics. Flexed posture noted throughout with narrow BOS + occasional scissoring. Pt will greatly benefit from SNF at DC to address deficits while assisting pt to PLOF.    Follow Up Recommendations  SNF     Equipment Recommendations  Other (comment) (defer to next level of care)       Precautions / Restrictions Precautions Precautions: Fall Restrictions Weight Bearing Restrictions: No    Mobility  Bed Mobility Overal bed mobility: Needs Assistance Bed Mobility: Supine to Sit;Sit to Supine     Supine to sit: Mod assist Sit to supine: Mod assist        Transfers Overall transfer level: Needs assistance Equipment used: Rolling walker (2 wheeled) Transfers: Sit to/from Stand Sit to Stand: Min assist;Mod assist;From elevated surface         General transfer comment: Pt was able to stand from elevated bed height with min-mod assist  Ambulation/Gait Ambulation/Gait assistance: Min assist Gait Distance (Feet): 100 Feet Assistive device: Rolling walker (2 wheeled)    Gait velocity: decreased   General Gait Details: Pt was able to ambulate 100 ft with RW but has slow, very narrow BOS. poor gait posture throughout.       Balance Overall balance assessment: Needs assistance Sitting-balance support: Feet supported Sitting balance-Leahy Scale: Fair     Standing balance support: No upper extremity supported Standing balance-Leahy Scale: Poor         Cognition Arousal/Alertness: Awake/alert Behavior During Therapy: WFL for tasks assessed/performed Overall Cognitive Status: No family/caregiver present to determine baseline cognitive functioning      General Comments: Pt was awake however does have baseline cognition deficits. Spouse in room and very supportive.             Pertinent Vitals/Pain Pain Assessment: No/denies pain           PT Goals (current goals can now be found in the care plan section) Acute Rehab PT Goals Patient Stated Goal: none stated    Frequency    Min 2X/week      PT Plan Current plan remains appropriate       AM-PAC PT "6 Clicks" Mobility   Outcome Measure  Help needed turning from your back to your side while in a flat bed without using bedrails?: A Lot Help needed moving from lying on your back to sitting on the side of a flat bed without using bedrails?: A Lot Help needed moving to and from a bed to a chair (including a wheelchair)?: A Lot Help needed standing up from a chair using your arms (e.g., wheelchair or bedside chair)?:  A Lot Help needed to walk in hospital room?: A Lot Help needed climbing 3-5 steps with a railing? : A Lot 6 Click Score: 12    End of Session Equipment Utilized During Treatment: Gait belt Activity Tolerance: Patient tolerated treatment well Patient left: in bed;with call bell/phone within reach Nurse Communication: Mobility status PT Visit Diagnosis: Muscle weakness (generalized) (M62.81);Unsteadiness on feet (R26.81);Difficulty in walking, not elsewhere classified  (R26.2)     Time: [id number] PT Time Calculation (min) (ACUTE ONLY): 28 min  Charges:  $Gait Training: 8-22 mins $Therapeutic Activity: 8-22 mins                     Julaine Fusi PTA 06/17/20, 4:35 PM

## 2020-06-17 NOTE — Progress Notes (Signed)
PROGRESS NOTE  Steven Ingram [id number] DOB: May 28, 1950 DOA: 06/15/2020 PCP: [facility] [employer]  Brief History   The patient is a 70 yr old man who presented to [facility] ED from home with complaints of new onset generalized weakness with shaking and jerks. The patient is demented at baseline. 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 Changes were noted on the morning of 3/2/20222. CT head negative for evidence of acute intracranial abnormality.   [facility] have been consulted to admit the patient for further evaluation and treatment. EEG has been ordered. PT/OT has been ordered. He is on seizure precautions. He has been admitted under observation status.   Pharmacy has raised the idea that perhaps the patient's altered mental status is due to his taking Cipro prior to presentation for a couple of days for UTI. It will be recorded as a possible adverse reaction.   Sensorium is somewhat improved, but patient continues to appear to have significant cognitive decline. EEG and MRI negative for causative findings. MRI did demonstrate extensive small vessel ischemic changes.   OT has recommended SNF placement. Placement pending.   Consultants  . Neurology  Procedures  . EEG is negative for seizure.  Antibiotics   Anti-infectives (From admission, onward)   None     Subjective  The patient is sitting up in bed. He is not displaying the shaking as he was yesterday. No new complaints.  Objective   Vitals:  Vitals:   06/17/20 0828 06/17/20 1221  BP: (!) 167/82 127/69  Pulse: 73 74  Resp: 16 15  Temp: 98.2 F (36.8 C) 97.8 F (36.6 C)  SpO2: 99% 99%   Exam:  Constitutional:  . The patient is awake and alert. He is oriented to self. No acute distress. Respiratory:  . No increased work of breathing. . No wheezes, rales, or rhonchi . No tactile  fremitus Cardiovascular:  . Regular rate and rhythm . No murmurs, ectopy, or gallups. . No lateral PMI. No thrills. Abdomen:  . Abdomen is soft, non-tender, non-distended . No hernias, masses, or organomegaly . Normoactive bowel sounds.  Musculoskeletal:  . No cyanosis, clubbing, or edema Skin:  . No rashes, lesions, ulcers . palpation of skin: no induration or nodules Neurologic:  . Pt is moving all extremities. No further tremors are exhibited.  Psychiatric:  . Affect is flat. Mood is depresssed.  I have personally reviewed the following:   Today's Data  . Orthoptist  . UA negative for infection  Imaging  . CT head without contrast . MRI Brain . EEG negative  Cardiology Data  . EKG Sinus rhythm with 1st degree AVB  Scheduled Meds: . amLODipine  10 mg Oral Daily  . atorvastatin  80 mg Oral q1800  . Chlorhexidine Gluconate Cloth  6 each Topical Daily  . clopidogrel  75 mg Oral Daily  . diltiazem  60 mg Oral Q8H  . heparin  5,000 Units Subcutaneous Q8H  . insulin aspart  0-9 Units Subcutaneous TID WC  . risperiDONE  0.5 mg Oral Daily  . sertraline  25 mg Oral Daily  . sodium chloride flush  3 mL Intravenous Once  . sodium chloride flush  3 mL Intravenous Q12H  . tamsulosin  0.4 mg Oral BID   Continuous Infusions:  Principal Problem:   Acute kidney injury superimposed on CKD ([place]) Active Problems:  History of CVA (cerebrovascular accident)   Type 2 diabetes mellitus with stage 3 chronic kidney disease (HCC)   CAD (coronary artery disease)   Hypertension associated with diabetes (HCC)   Chronic diastolic CHF (congestive heart failure) (HCC)   Dementia without behavioral disturbance ([place])   Seizure-like activity ([place])   Hyperlipidemia associated with type 2 diabetes mellitus ([place])   LOS: 0 days   A & P  Acute kidney injury superimposed on CKD ([place]) Active Problems:   History of CVA (cerebrovascular accident)   Type 2 diabetes mellitus with  stage 3 chronic kidney disease (HCC)   CAD (coronary artery disease)   Hypertension associated with diabetes ([place])   Chronic diastolic CHF (congestive heart failure) (HCC)   Dementia without behavioral disturbance ([place])   Seizure-like activity ([place])   Hyperlipidemia associated with type 2 diabetes mellitus ([place])  Steven Ingram is a 70 y.o. male with medical history significant for dementia, history of CVA, CKD stage III, s/p right nephrectomy (? RCC), CAD, chronic diastolic CHF (EF 0000000, [id number]), T2DM, HTN, HLD, BPH who is admitted with AKI on CKD stage III.  Acute Encephalopathy: Pt is somewhat better today. He is certainly more awake and alert. Still confused. Speech is somewhat impaired. No clear etiology. Possibly due to adverse reaction to Cipro taken for a couple of days prior to admission for a UTI. This is also in the setting of AKI.  Also of note patient's B-12 is low. Will supplement.    AKI on CKD stage III: Baseline creatinine 1.5.  Creatinine 1.88 on admission. 1.69 this am. Likely multifactorial from urinary retention and medication effect (taking lisinopril and Invokana per recent notes in Care Everywhere). -Received 1 L LR in ED -Foley catheter placed in ED -Hold lisinopril, Invokana, NSAIDs and nephrotoxic agents Monitor electrolytes, creatinine and volume status.   Seizure-like activity/myoclonus/generalized weakness: EEG negative. MRI demonstrated chronic microvascular ischemic changes. Pharmacy has raised the possibility that mental status changes and tremors are related to recent Cipro that the patient tool for a UTI. It has now been stopped and has been recorded to have possibly caused mental status changes. Also possibly due to uremia from AKI.   Pt is on seizure precautions. PT/OT to eval and treat. EEG is negative.   BPH with urinary retention: Foley catheter placed in the ED.  Continue Flomax.  Type 2 diabetes with hyperglycemia: Without evidence of DKA or  HHS.  Continue sensitive SSI. Pt is on renal/ carb modified diet. Blood sugars have been running 157-219 in the last 24 hours.   History of CVA: Continue Plavix and atorvastatin.  CAD: Denies any chest pain.  Troponin minimally elevated at 36, repeat pending.  No acute changes on EKG.  Continue Plavix and atorvastatin.  Hypertension: Resume amlodipine. Blood pressures are normotensive today.  Hyperlipidemia: Continue atorvastatin.  Chronic diastolic CHF: Appears stable without acute exacerbation.  Mild edema in lower extremities, this is decreased from his baseline per wife.  Not on diuretics as an outpatient.  Monitor strict I/O's.  Dementia/depression: Continue Risperdal, sertraline.  I have seen and examined this patient myself. I have spent 38 minutes in his evaluation and care.  DVT prophylaxis: Subcutaneous heparin Code Status: Full code, confirmed with patient and wife at bedside Family Communication: None available. Disposition Plan:  Status is: Inpatient  Remains inpatient appropriate because:Ongoing diagnostic testing needed not appropriate for outpatient work up   Dispo: The patient is from: Home  Anticipated d/c is to: SNF              Patient currently is not medically stable to d/c.   Difficult to place patient No Easter Kennebrew, DO [facility] Direct contact: see [email or web address]  7PM-7AM contact night coverage as above 06/17/2020, 5:16 PM  LOS: 0 days

## 2020-06-17 NOTE — Plan of Care (Signed)

## 2020-06-17 NOTE — Care Management Obs Status (Signed)
[place] NOTIFICATION   Patient Details  Name: Steven Ingram MRN: [id number] Date of Birth: 02/27/1951   Medicare Observation Status Notification Given:  Yes  Patient is disoriented to situation. CSW reviewed with patient's wife [name] via phone as she is not currently at the hospital with patient.  [place], LCSW 06/17/2020, 9:04 AM

## 2020-06-17 NOTE — Evaluation (Addendum)
Clinical/Bedside Swallow Evaluation Patient Details  Name: Steven Ingram MRN: [id number] Date of Birth: [birth year]  Today's Date: 06/17/2020 Time: SLP Start Time (ACUTE ONLY): 0955 SLP Stop Time (ACUTE ONLY): 1055 SLP Time Calculation (min) (ACUTE ONLY): 60 min  Past Medical History:  Past Medical History:  Diagnosis Date  . Acute kidney injury superimposed on CKD ([place]) 03/31/2020  . Acute metabolic encephalopathy 123456  . CAD (coronary artery disease) 07/19/2019  . Chronic diastolic CHF (congestive heart failure) ([place]) 03/31/2020  . CVA (cerebral vascular accident) ([place]) 10/03/2018  . Dementia without behavioral disturbance ([place])   . Diabetes mellitus without complication ([place])   . Encephalopathy acute 04/01/2020  . H/O right nephrectomy 07/19/2019  . High cholesterol   . History of CVA (cerebrovascular accident) 07/19/2019  . HLD (hyperlipidemia) 03/31/2020  . HTN (hypertension) 07/19/2019  . Hypertension   . Hypokalemia   . 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 Malnutrition of moderate degree 07/20/2019  . SIRS (systemic inflammatory response syndrome) ([place]) 03/31/2020  . Type 2 diabetes mellitus with stage 3 chronic kidney disease ([place]) 07/19/2019   Past Surgical History:  Past Surgical History:  Procedure Laterality Date  . TEE WITHOUT CARDIOVERSION N/A 10/07/2018   Procedure: TRANSESOPHAGEAL ECHOCARDIOGRAM (TEE);  Surgeon: Teodoro Spray, MD;  Location: [facility] ORS;  Service: Cardiovascular;  Laterality: N/A;   HPI:  Pt is a 70 y.o. male with Multiple issues of medical history including Dementia, history of CVA, CKD stage III, s/p right nephrectomy (? RCC), CAD, chronic diastolic CHF (EF 0000000, XX123456), T2DM, HTN, HLD, BPH, CVA, malnutrtion, who presents to the ED for evaluation of new onset of generalized weakness with shaking/jerking movements.  History is limited from patient due to Dementia and is otherwise obtained from [place], chart review, and his wife at bedside. Per wife, patient  had new onset of whole body shaking motion with abnormal/slurred speech.  He has not been able to ambulate on his own.  She says he has chronic urinary incontinence.  She says he was recently started on an antibiotic to treat a UTI.  MRI/Head CT: No acute or reversible finding. Extensive chronic small-vessel  ischemic changes throughout the brain.   Assessment / Plan / Recommendation Clinical Impression  Pt is known to this service and has been seen for Dysphagia evaluation in previous 2 years now -- recommendations have been for a Dysphagia level 3 diet (chopped meats) d/t lengthy oral phase time w/ solids. Pt does have Baseline Dementia which is known to impact the Oral phase of swallowing.  Currently at this evaluation, pt appears to present w/ grossly functional pharyngeal phase swallowing function when following general aspiration precautions; however, pt does exhibit Oral phase deficits c/b lengthy mastication of increased textures. No immediate, overt clinical s/s of aspiration noted w/ po trials except during Mixed Consistency trials(drinking milk w/ food still in mouth). Pt was instructed in, and monitored for, following general aspiration precautions w/ all oral intake -- d/t decreased awareness and min impulsivity overall. Pt appears at reduced risk for aspiration when following aspiration precautions w/ a modified diet(minced foods). Suspect pt's Oral phase deficits are related to his Baseline Cognitive decline, Dementia.  Pt needed positioning support for sitting up in bed to eat safely also -- again suspect related to Cognitive decline/awareness. Pt consumed po trials of thin liquids VIA CUP, purees, and softened solids w/ no coughing/throat clearing except when pt took Mixed consistencies of food/liquid. W/ single trials of each, and when pt slowed down  and took smaller, single sips and bites, no other coughing or throat clearing noted; vocal quality clear and no decline in respiratory status  noted(98% O2 sats). Oral phase was c/b lengthy bolus management, mastication, and oral clearing (~2+ mins w/ some solid food trials) w/ expectoration of 2 trials after lengthy munching/mastication. More of a munching pattern noted vs rotary mastication during bolus management of solids. Alternating foods to aid oral clearing instructed. Pt fed self w/ setup assistance given. OM exam grossly [facility] for individual movements/strength w/ no unilateral weakness or decreased ROM noted.  Recommend a Dysphagia level 2 (MINCED diet) d/t decline in Cognitive attention to solids foods during Oral phase. Recommend general aspiration precautions. Pills Crushed in Puree if needed for safer swallowing. Tray setup and positioning upright for meals. Assistance w/ meals as needed. ST services can be available for any further education re: diet consistency; largely suspect pt may be at his new Baseline re: his oropharyngeal swallowing d/t Baseline declined Cognitive status; Dementia. NSG to reconsult if any decline in status while admitted. Pt can be followed at next venue of care for any diet consistency upgrade if appropriate then.  SLP Visit Diagnosis: Dysphagia, oropharyngeal phase (R13.12) (baseline Dementia)    Aspiration Risk  Mild aspiration risk;Risk for inadequate nutrition/hydration (reduced w/ precautions in place)    Diet Recommendation  Dysphagia level 2 (MINCED foods w/ Gravies); Thin liquids VIA CUP ONLY. General aspiration precautions; Reflux precautions. Reduced distractions during meals; tray setup and monitoring  Medication Administration: Crushed with puree (for safety of swallowing)    Other  Recommendations Recommended Consults:  (Dietician f/u; Palliative Care f/u for overall GOC) Oral Care Recommendations: Oral care BID;Oral care before and after PO;Staff/trained caregiver to provide oral care (upper Denture) Other Recommendations:  (n/a)   Follow up Recommendations None (TBD)      Frequency and  Duration  (n/a)   (n/a)       Prognosis Prognosis for Safe Diet Advancement: Fair Barriers to Reach Goals: Cognitive deficits;Time post onset;Severity of deficits;Behavior      Swallow Study   General Date of Onset: 06/16/20 HPI: Pt is a 70 y.o. male with Multiple issues of medical history including Dementia, history of CVA, CKD stage III, s/p right nephrectomy (? RCC), CAD, chronic diastolic CHF (EF 0000000, [id number]), T2DM, HTN, HLD, BPH, CVA, malnutrtion, who presents to the ED for evaluation of new onset of generalized weakness with shaking/jerking movements.  History is limited from patient due to Dementia and is otherwise obtained from [place], chart review, and his wife at bedside. Per wife, patient had new onset of whole body shaking motion with abnormal/slurred speech.  He has not been able to ambulate on his own.  She says he has chronic urinary incontinence.  She says he was recently started on an antibiotic to treat a UTI.  MRI/Head CT: No acute or reversible finding. Extensive chronic small-vessel  ischemic changes throughout the brain. Type of Study: Bedside Swallow Evaluation Previous Swallow Assessment: 2020; 2021(x2) Diet Prior to this Study: Regular;Thin liquids Temperature Spikes Noted: No (wbc 5.5) Respiratory Status: Room air History of Recent Intubation: No Behavior/Cognition: Alert;Cooperative;Pleasant mood;Confused;Distractible;Requires cueing (baseline Dementia) Oral Cavity Assessment:  (sticky) Oral Care Completed by SLP: Yes Oral Cavity - Dentition: Dentures, top (native bottom) Vision: Functional for self-feeding Self-Feeding Abilities: Able to feed self;Needs assist;Needs set up (min slow; imupulsive) Patient Positioning: Upright in bed (needed positioning support) Baseline Vocal Quality: Normal;Low vocal intensity Volitional Cough: Strong Volitional Swallow: Able to elicit  Oral/Motor/Sensory Function Overall Oral Motor/Sensory Function: Within functional limits  (grossly)   Ice Chips Ice chips: Within functional limits Presentation: Spoon (fed; 3 trialsq)   Thin Liquid Thin Liquid: Within functional limits (grossly -- single sips from cup) Presentation: Cup;Self Fed (~8 ozs total) Other Comments: NO MIXED consistencies    Nectar Thick Nectar Thick Liquid: Within functional limits Presentation: Cup;Self Fed   Honey Thick Honey Thick Liquid: Not tested   Puree Puree: Within functional limits Presentation: Self Fed;Spoon (8 ozs+)   Solid     Solid: Impaired Presentation: Self Fed;Spoon (8-10 trials) Oral Phase Impairments: Impaired mastication;Poor awareness of bolus (increased Time) Oral Phase Functional Implications: Prolonged oral transit;Impaired mastication Pharyngeal Phase Impairments:  (none) Other Comments: expectorated 2 pieces       Orinda Kenner, MS, CCC-SLP Speech Language Pathologist Rehab Services [phone] [facility] 06/17/2020,11:35 AM

## 2020-06-17 NOTE — NC FL2 (Signed)
[place] LEVEL OF CARE SCREENING TOOL     IDENTIFICATION  Patient Name: Steven Ingram Birthdate: 1950-07-07 Sex: male Admission Date (Current Location): 06/15/2020  [place] and [place] Number:  Engineering geologist and Address:  [facility], [address], [place], [place] [address]      Provider Number: [id number]  Attending Physician Name and Address:  Karie Kirks, DO  Relative Name and Phone Number:  Janiel, Runkel (Spouse) [phone]    Current Level of Care: Hospital Recommended Level of Care: [place] Prior Approval Number:    Date Approved/Denied:   PASRR Number: [id number]  Discharge Plan: SNF    Current Diagnoses: Patient Active Problem List   Diagnosis Date Noted  . Seizure-like activity ([place]) 06/15/2020  . Hyperlipidemia associated with type 2 diabetes mellitus ([place]) 06/15/2020  . Dementia without behavioral disturbance ([place])   . Hypokalemia   . Encephalopathy acute 04/01/2020  . Acute metabolic encephalopathy 123456  . SIRS (systemic inflammatory response syndrome) ([place]) 03/31/2020  . Acute kidney injury superimposed on CKD ([place]) 03/31/2020  . HLD (hyperlipidemia) 03/31/2020  . Chronic diastolic CHF (congestive heart failure) ([place]) 03/31/2020  . Malnutrition of moderate degree 07/20/2019  . Dizziness 07/19/2019  . Unsteady gait when walking 07/19/2019  . History of CVA (cerebrovascular accident) 07/19/2019  . Type 2 diabetes mellitus with stage 3 chronic kidney disease ([place]) 07/19/2019  . CAD (coronary artery disease) 07/19/2019  . Hypertension associated with diabetes ([place]) 07/19/2019  . H/O right nephrectomy 07/19/2019  . 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 CVA (cerebral vascular accident) ([place]) 10/03/2018    Orientation RESPIRATION BLADDER Height & Weight     Self,Place  Normal Incontinent,External catheter Weight: 193 lb 14.4 oz (88 kg) Height:  '5\' 9"'$  (175.3 cm)  BEHAVIORAL  SYMPTOMS/MOOD NEUROLOGICAL BOWEL NUTRITION STATUS        Diet (renal/carb modified with fluid restrictions 1200 mL)  AMBULATORY STATUS COMMUNICATION OF NEEDS Skin   Limited Assist Verbally Normal                       Personal Care Assistance Level of Assistance  Bathing,Feeding,Dressing Bathing Assistance: Limited assistance Feeding assistance: Limited assistance Dressing Assistance: Limited assistance     Functional Limitations Info             SPECIAL CARE FACTORS FREQUENCY  PT (By licensed PT),OT (By licensed OT)     PT Frequency: 5 x/week OT Frequency: 5 x/week            Contractures      Additional Factors Info  Code Status,Allergies Code Status Info: full Allergies Info: nka           Current Medications (06/17/2020):  This is the current hospital active medication list Current Facility-Administered Medications  Medication Dose Route Frequency Provider Last Rate Last Admin  . acetaminophen (TYLENOL) tablet 650 mg  650 mg Oral Q6H PRN Lenore Cordia, MD       Or  . acetaminophen (TYLENOL) suppository 650 mg  650 mg Rectal Q6H PRN Zada Finders R, MD      . amLODipine (NORVASC) tablet 10 mg  10 mg Oral Daily Zada Finders R, MD   10 mg at 06/16/20 1034  . atorvastatin (LIPITOR) tablet 80 mg  80 mg Oral q1800 Lenore Cordia, MD   80 mg at 06/16/20 1734  . Chlorhexidine Gluconate Cloth 2 % PADS 6 each  6 each Topical Daily  Swayze, Ava, DO   6 each at 06/16/20 2059  . clopidogrel (PLAVIX) tablet 75 mg  75 mg Oral Daily Lenore Cordia, MD   75 mg at 06/16/20 1034  . diltiazem (CARDIZEM) tablet 60 mg  60 mg Oral Q8H Swayze, Ava, DO   60 mg at 06/17/20 0847  . haloperidol lactate (HALDOL) injection 1 mg  1 mg Intramuscular Q6H PRN Mansy, Jan A, MD      . heparin injection 5,000 Units  5,000 Units Subcutaneous Q8H Lenore Cordia, MD   5,000 Units at 06/17/20 0847  . insulin aspart (novoLOG) injection 0-9 Units  0-9 Units Subcutaneous TID WC Lenore Cordia,  MD   2 Units at 06/17/20 0848  . ondansetron (ZOFRAN) tablet 4 mg  4 mg Oral Q6H PRN Lenore Cordia, MD       Or  . ondansetron (ZOFRAN) injection 4 mg  4 mg Intravenous Q6H PRN Zada Finders R, MD      . risperiDONE (RISPERDAL) tablet 0.5 mg  0.5 mg Oral Daily Zada Finders R, MD   0.5 mg at 06/16/20 1034  . sertraline (ZOLOFT) tablet 25 mg  25 mg Oral Daily Zada Finders R, MD   25 mg at 06/16/20 1034  . sodium chloride flush (NS) 0.9 % injection 3 mL  3 mL Intravenous Once Lucrezia Starch, MD      . sodium chloride flush (NS) 0.9 % injection 3 mL  3 mL Intravenous Q12H Lenore Cordia, MD   3 mL at 06/17/20 0852  . tamsulosin (FLOMAX) capsule 0.4 mg  0.4 mg Oral BID Lenore Cordia, MD   0.4 mg at 06/16/20 2059     Discharge Medications: Please see discharge summary for a list of discharge medications.  Relevant Imaging Results:  Relevant Lab Results:   Additional Information SS #: [id number]  [place], LCSW

## 2020-06-18 DIAGNOSIS — N179 Acute kidney failure, unspecified: Secondary | ICD-10-CM | POA: Diagnosis not present

## 2020-06-18 DIAGNOSIS — R531 Weakness: Secondary | ICD-10-CM | POA: Diagnosis not present

## 2020-06-18 DIAGNOSIS — N189 Chronic kidney disease, unspecified: Secondary | ICD-10-CM | POA: Diagnosis not present

## 2020-06-18 LAB — GLUCOSE, CAPILLARY
Glucose-Capillary: 205 mg/dL — ABNORMAL HIGH (ref 70–99)
Glucose-Capillary: 229 mg/dL — ABNORMAL HIGH (ref 70–99)
Glucose-Capillary: 219 mg/dL — ABNORMAL HIGH (ref 70–99)
Glucose-Capillary: 201 mg/dL — ABNORMAL HIGH (ref 70–99)

## 2020-06-18 MED ORDER — VITAMIN B-12 1000 MCG PO TABS
1000.0000 ug | ORAL_TABLET | Freq: Every day | ORAL | Status: DC
  Administered 2020-06-18 – 2020-06-21 (×4): 1000 ug via ORAL
  Filled 2020-06-18 (×4): qty 1

## 2020-06-18 NOTE — Progress Notes (Signed)
PROGRESS NOTE  Steven Ingram [id number] DOB: 01/26/1951 DOA: 06/15/2020 PCP: [employer] [employer]  Brief History   The patient is a 70 yr old man who presented to [facility] ED from home with complaints of new onset generalized weakness with shaking and jerks. The patient is demented at baseline. 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 Changes were noted on the morning of 3/2/20222. CT head negative for evidence of acute intracranial abnormality.   [facility] have been consulted to admit the patient for further evaluation and treatment. EEG has been ordered. PT/OT has been ordered. He is on seizure precautions. He has been admitted under inpatient status  Pharmacy has raised the idea that perhaps the patient's altered mental status is due to his taking Cipro prior to presentation for a couple of days for UTI. It will be recorded as a possible adverse reaction.   Sensorium is somewhat improved, but patient continues to appear to have significant cognitive decline. EEG and MRI negative for causative findings. MRI did demonstrate extensive small vessel ischemic changes.   OT has recommended SNF placement. Placement pending.   Consultants  . Neurology  Procedures  . EEG is negative for seizure.  Antibiotics   Anti-infectives (From admission, onward)   None     Subjective  The patient is sitting up in bed. He is not displaying the shaking as he was yesterday. No new complaints.  Objective   Vitals:  Vitals:   06/18/20 0733 06/18/20 1217  BP: (!) 154/82 135/71  Pulse: 90 86  Resp: 14 16  Temp: 98.3 F (36.8 C) 98.4 F (36.9 C)  SpO2: 100% 100%   Exam:  Constitutional:  . The patient is awake and alert. He is oriented to self. No acute distress. Respiratory:  . No increased work of breathing. . No wheezes, rales, or rhonchi . No tactile  fremitus Cardiovascular:  . Regular rate and rhythm . No murmurs, ectopy, or gallups. . No lateral PMI. No thrills. Abdomen:  . Abdomen is soft, non-tender, non-distended . No hernias, masses, or organomegaly . Normoactive bowel sounds.  Musculoskeletal:  . No cyanosis, clubbing, or edema Skin:  . No rashes, lesions, ulcers . palpation of skin: no induration or nodules Neurologic:  . Pt is moving all extremities. No further tremors are exhibited.  Psychiatric:  . Affect is flat. Mood is depresssed. . The patient is awake and alert. Marland Kitchen He is not oriented and he is confused.  I have personally reviewed the following:   Today's Data  . Orthoptist  . UA negative for infection  Imaging  . CT head without contrast . MRI Brain . EEG negative  Cardiology Data  . EKG Sinus rhythm with 1st degree AVB  Scheduled Meds: . amLODipine  10 mg Oral Daily  . atorvastatin  80 mg Oral q1800  . Chlorhexidine Gluconate Cloth  6 each Topical Daily  . clopidogrel  75 mg Oral Daily  . diltiazem  60 mg Oral Q8H  . heparin  5,000 Units Subcutaneous Q8H  . insulin aspart  0-9 Units Subcutaneous TID WC  . risperiDONE  0.5 mg Oral Daily  . sertraline  25 mg Oral Daily  . sodium chloride flush  3 mL Intravenous Once  . sodium chloride flush  3 mL Intravenous Q12H  . tamsulosin  0.4 mg Oral BID   Continuous  Infusions:  Principal Problem:   Acute kidney injury superimposed on CKD (HCC) Active Problems:   History of CVA (cerebrovascular accident)   Type 2 diabetes mellitus with stage 3 chronic kidney disease (HCC)   CAD (coronary artery disease)   Hypertension associated with diabetes ([place])   Acute metabolic encephalopathy   Chronic diastolic CHF (congestive heart failure) (HCC)   Dementia without behavioral disturbance ([place])   Seizure-like activity ([place])   Hyperlipidemia associated with type 2 diabetes mellitus ([place])   LOS: 1 day   A & P  Acute kidney injury superimposed on  CKD ([place]) Active Problems:   History of CVA (cerebrovascular accident)   Type 2 diabetes mellitus with stage 3 chronic kidney disease (HCC)   CAD (coronary artery disease)   Hypertension associated with diabetes ([place])   Chronic diastolic CHF (congestive heart failure) (HCC)   Dementia without behavioral disturbance ([place])   Seizure-like activity ([place])   Hyperlipidemia associated with type 2 diabetes mellitus ([place])  Steven Ingram is a 70 y.o. male with medical history significant for dementia, history of CVA, CKD stage III, s/p right nephrectomy (? RCC), CAD, chronic diastolic CHF (EF 0000000, [id number]), T2DM, HTN, HLD, BPH who is admitted with AKI on CKD stage III.  Acute Encephalopathy: Pt is somewhat better today. He is certainly more awake and alert. Still confused. He is not oriented to place, time, date, or situation. Speech remains somewhat impaired. No clear etiology. Possibly due to adverse reaction to Cipro taken for a couple of days prior to admission for a UTI. This is also in the setting of AKI.  Also of note patient's B-12 is low. Will supplement.    AKI on CKD stage III: Baseline creatinine 1.5.  Creatinine 1.88 on admission. 1.69 this am. Likely multifactorial from urinary retention and medication effect (taking lisinopril and Invokana per recent notes in Care Everywhere). -Received 1 L LR in ED -Foley catheter placed in ED -Hold lisinopril, Invokana, NSAIDs and nephrotoxic agents Monitor electrolytes, creatinine and volume status.   Seizure-like activity/myoclonus/generalized weakness: EEG negative. MRI demonstrated chronic microvascular ischemic changes. Pharmacy has raised the possibility that mental status changes and tremors are related to recent Cipro that the patient tool for a UTI. It has now been stopped and has been recorded to have possibly caused mental status changes. Also possibly due to uremia from AKI.   Pt is on seizure precautions. PT/OT to eval and treat. EEG  is negative.   BPH with urinary retention: Foley catheter placed in the ED.  Continue Flomax.  Type 2 diabetes with hyperglycemia: Without evidence of DKA or HHS.  Continue sensitive SSI. Pt is on renal/ carb modified diet. Blood sugars have been running 157-219 in the last 24 hours.   History of CVA: Continue Plavix and atorvastatin.  CAD: Denies any chest pain.  Troponin minimally elevated at 36, repeat pending.  No acute changes on EKG.  Continue Plavix and atorvastatin.  Hypertension: Resume amlodipine. Blood pressures are normotensive today.  Hyperlipidemia: Continue atorvastatin.  Chronic diastolic CHF: Appears stable without acute exacerbation.  Mild edema in lower extremities, this is decreased from his baseline per wife.  Not on diuretics as an outpatient.  Monitor strict I/O's.  Dementia/depression: Continue Risperdal, sertraline.  I have seen and examined this patient myself. I have spent 32 minutes in his evaluation and care.  DVT prophylaxis: Subcutaneous heparin Code Status: Full code, confirmed with patient and wife at bedside Family Communication: None available. Disposition Plan:  Status is:  Inpatient  Remains inpatient appropriate because:Ongoing diagnostic testing needed not appropriate for outpatient work up   Dispo: The patient is from: Home              Anticipated d/c is to: SNF              Patient currently is not medically stable to d/c. no safe   discharge.   Difficult to place patient No Ava Swayze, DO [facility] Direct contact: see [email or web address]  7PM-7AM contact night coverage as above 06/18/2020, 2:34 PM  LOS: 0 days

## 2020-06-19 DIAGNOSIS — N179 Acute kidney failure, unspecified: Secondary | ICD-10-CM | POA: Diagnosis not present

## 2020-06-19 DIAGNOSIS — N189 Chronic kidney disease, unspecified: Secondary | ICD-10-CM | POA: Diagnosis not present

## 2020-06-19 DIAGNOSIS — R531 Weakness: Secondary | ICD-10-CM | POA: Diagnosis not present

## 2020-06-19 LAB — CBC WITH DIFFERENTIAL/PLATELET
Abs Immature Granulocytes: 0.01 10*3/uL (ref 0.00–0.07)
Basophils Absolute: 0 10*3/uL (ref 0.0–0.1)
Basophils Relative: 0 %
Eosinophils Absolute: 0.1 10*3/uL (ref 0.0–0.5)
Eosinophils Relative: 1 %
HCT: 34.1 % — ABNORMAL LOW (ref 39.0–52.0)
Hemoglobin: 12 g/dL — ABNORMAL LOW (ref 13.0–17.0)
Immature Granulocytes: 0 %
Lymphocytes Relative: 35 %
Lymphs Abs: 1.8 10*3/uL (ref 0.7–4.0)
MCH: 33.4 pg (ref 26.0–34.0)
MCHC: 35.2 g/dL (ref 30.0–36.0)
MCV: 95 fL (ref 80.0–100.0)
Monocytes Absolute: 0.7 10*3/uL (ref 0.1–1.0)
Monocytes Relative: 14 %
Neutro Abs: 2.6 10*3/uL (ref 1.7–7.7)
Neutrophils Relative %: 50 %
Platelets: 209 10*3/uL (ref 150–400)
RBC: 3.59 MIL/uL — ABNORMAL LOW (ref 4.22–5.81)
RDW: 11.4 % — ABNORMAL LOW (ref 11.5–15.5)
WBC: 5.1 10*3/uL (ref 4.0–10.5)
nRBC: 0 % (ref 0.0–0.2)

## 2020-06-19 LAB — GLUCOSE, CAPILLARY
Glucose-Capillary: 188 mg/dL — ABNORMAL HIGH (ref 70–99)
Glucose-Capillary: 243 mg/dL — ABNORMAL HIGH (ref 70–99)
Glucose-Capillary: 241 mg/dL — ABNORMAL HIGH (ref 70–99)
Glucose-Capillary: 272 mg/dL — ABNORMAL HIGH (ref 70–99)

## 2020-06-19 LAB — COMPREHENSIVE METABOLIC PANEL
ALT: 13 U/L (ref 0–44)
AST: 13 U/L — ABNORMAL LOW (ref 15–41)
Albumin: 3 g/dL — ABNORMAL LOW (ref 3.5–5.0)
Alkaline Phosphatase: 42 U/L (ref 38–126)
Anion gap: 8 (ref 5–15)
BUN: 26 mg/dL — ABNORMAL HIGH (ref 8–23)
CO2: 25 mmol/L (ref 22–32)
Calcium: 9 mg/dL (ref 8.9–10.3)
Chloride: 107 mmol/L (ref 98–111)
Creatinine, Ser: 1.85 mg/dL — ABNORMAL HIGH (ref 0.61–1.24)
GFR, Estimated: 39 mL/min — ABNORMAL LOW (ref >60)
Glucose, Bld: 203 mg/dL — ABNORMAL HIGH (ref 70–99)
Potassium: 3.7 mmol/L (ref 3.5–5.1)
Sodium: 140 mmol/L (ref 135–145)
Total Bilirubin: 0.9 mg/dL (ref 0.3–1.2)
Total Protein: 6 g/dL — ABNORMAL LOW (ref 6.5–8.1)

## 2020-06-19 MED ORDER — SODIUM CHLORIDE 0.9 % IV SOLN: INTRAVENOUS | Status: DC

## 2020-06-19 NOTE — Progress Notes (Signed)
PROGRESS NOTE  Steven Ingram [id number] DOB: [birth year] DOA: 06/15/2020 PCP: [employer]  Brief History   The patient is a 70 yr old man who presented to [facility] ED from home with complaints of new onset generalized weakness with shaking and jerks. The patient is demented at baseline. 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 Changes were noted on the morning of 3/2/20222. CT head negative for evidence of acute intracranial abnormality.   [facility] have been consulted to admit the patient for further evaluation and treatment. EEG has been ordered. PT/OT has been ordered. He is on seizure precautions. He has been admitted under inpatient status  Pharmacy has raised the idea that perhaps the patient's altered mental status is due to his taking Cipro prior to presentation for a couple of days for UTI. It will be recorded as a possible adverse reaction.   Sensorium is somewhat improved, but patient continues to appear to have significant cognitive decline. EEG and MRI negative for causative findings. MRI did demonstrate extensive small vessel ischemic changes.   OT has recommended SNF placement. Placement pending.   Consultants  . Neurology  Procedures  . EEG is negative for seizure.  Antibiotics   Anti-infectives (From admission, onward)   None     Subjective  The patient is sitting up in bed. He is not displaying the shaking anymore. He remains confused. No new complaints.  Objective   Vitals:  Vitals:   06/19/20 0818 06/19/20 1207  BP: (!) 150/78 (!) 141/74  Pulse: 85 78  Resp: 16 14  Temp: 98.6 F (37 C) (!) 97.3 F (36.3 C)  SpO2: 100% 99%   Exam:  Constitutional:  . The patient is awake and alert. He is oriented to self. No acute distress. He remains confused. Respiratory:  . No increased work of breathing. . No wheezes, rales, or  rhonchi . No tactile fremitus Cardiovascular:  . Regular rate and rhythm . No murmurs, ectopy, or gallups. . No lateral PMI. No thrills. Abdomen:  . Abdomen is soft, non-tender, non-distended . No hernias, masses, or organomegaly . Normoactive bowel sounds.  Musculoskeletal:  . No cyanosis, clubbing, or edema Skin:  . No rashes, lesions, ulcers . palpation of skin: no induration or nodules Neurologic:  . Pt is moving all extremities. No further tremors are exhibited.  Psychiatric:  . Affect is flat. Mood is depresssed. . The patient is awake and alert. Marland Kitchen He is not oriented and he is confused.  I have personally reviewed the following:   Today's Data  . Vitals, CMP, CBC, Glucoses  Micro Data  . UA negative for infection  Imaging  . CT head without contrast . MRI Brain . EEG negative  Cardiology Data  . EKG Sinus rhythm with 1st degree AVB  Scheduled Meds: . amLODipine  10 mg Oral Daily  . atorvastatin  80 mg Oral q1800  . Chlorhexidine Gluconate Cloth  6 each Topical Daily  . clopidogrel  75 mg Oral Daily  . diltiazem  60 mg Oral Q8H  . heparin  5,000 Units Subcutaneous Q8H  . insulin aspart  0-9 Units Subcutaneous TID WC  . risperiDONE  0.5 mg Oral Daily  . sertraline  25 mg Oral Daily  . sodium chloride flush  3 mL Intravenous Once  . sodium chloride flush  3 mL Intravenous Q12H  . tamsulosin  0.4 mg Oral BID  . vitamin B-12  1,000 mcg Oral Daily   Continuous Infusions:  Principal Problem:   Acute kidney injury superimposed on CKD (HCC) Active Problems:   History of CVA (cerebrovascular accident)   Type 2 diabetes mellitus with stage 3 chronic kidney disease (HCC)   CAD (coronary artery disease)   Hypertension associated with diabetes ([place])   Acute metabolic encephalopathy   Chronic diastolic CHF (congestive heart failure) (HCC)   Dementia without behavioral disturbance ([place])   Seizure-like activity ([place])   Hyperlipidemia associated with type 2  diabetes mellitus ([place])   LOS: 2 days   A & P  Acute kidney injury superimposed on CKD ([place]): Creatinine today is 1.85. Baseline creatinine is 1.50 - 1.6. Avoid nephrotoxic agents and hypotension. Monitor creatinine, electrolytes, and volume status.   Acute Encephalopathy: Pt is somewhat better today. He is certainly more awake and alert. Still confused. He is not oriented to place, time, date, or situation. Speech remains somewhat impaired. No clear etiology. Possibly due to adverse reaction to Cipro taken for a couple of days prior to admission for a UTI. This is also in the setting of AKI.  Also of note patient's B-12 is low. Will supplement.    AKI on CKD stage III: Baseline creatinine 1.5.  Creatinine 1.88 on admission. 1.85.  Likely multifactorial from urinary retention and medication effect (taking lisinopril and Invokana per recent notes in Care Everywhere). Avoid hypotension. Will holld lisinopril, Invokana, NSAIDs and any other nephrotoxic agents. Monitor electrolytes, creatinine and volume status.   Seizure-like activity/myoclonus/generalized weakness: Resolved. EEG negative. MRI demonstrated chronic microvascular ischemic changes. Pharmacy has raised the possibility that mental status changes and tremors are related to recent Cipro that the patient tool for a UTI. It has now been stopped and has been recorded to have possibly caused mental status changes. Also possibly due to uremia from AKI.   Pt is on seizure precautions. PT/OT to eval and treat. EEG is negative. Will not start [place].  BPH with urinary retention: Foley catheter placed in the ED.  Continue Flomax.  Type 2 diabetes with hyperglycemia: Without evidence of DKA or HHS.  Continue sensitive SSI. Pt is on renal/ carb modified diet. Blood sugars have been running 188-243 in the last 24 hours.   History of CVA: Continue Plavix and atorvastatin.  CAD: Denies any chest pain.  Troponin minimally elevated at 36, repeat  pending.  No acute changes on EKG.  Continue Plavix and atorvastatin.  Hypertension: Resume amlodipine. Blood pressures are normotensive today.  Hyperlipidemia: Continue atorvastatin.  Chronic diastolic CHF: Appears stable without acute exacerbation.  Mild edema in lower extremities, this is decreased from his baseline per wife.  Not on diuretics as an outpatient.  Monitor strict I/O's.  Dementia/depression: Continue Risperdal, sertraline.  I have seen and examined this patient myself. I have spent 30 minutes in his evaluation and care.  DVT prophylaxis: Subcutaneous heparin Code Status: Full code. Family Communication: None available. Disposition Plan:  Status is: Inpatient  Remains inpatient appropriate because:Ongoing diagnostic testing needed not appropriate for outpatient work up   Dispo: The patient is from: Home              Anticipated d/c is to: SNF              Patient currently is not medically stable to d/c. no safe   discharge.   Difficult to place patient No Karlina Suares, DO [facility] Direct contact: see [email or web address]  7PM-7AM contact night coverage as above 06/19/2020, 1:41 PM  LOS: 0 days

## 2020-06-19 NOTE — Plan of Care (Signed)

## 2020-06-20 DIAGNOSIS — N189 Chronic kidney disease, unspecified: Secondary | ICD-10-CM | POA: Diagnosis not present

## 2020-06-20 DIAGNOSIS — R531 Weakness: Secondary | ICD-10-CM | POA: Diagnosis not present

## 2020-06-20 DIAGNOSIS — F0390 Unspecified dementia without behavioral disturbance: Secondary | ICD-10-CM | POA: Diagnosis not present

## 2020-06-20 DIAGNOSIS — G9341 Metabolic encephalopathy: Secondary | ICD-10-CM

## 2020-06-20 DIAGNOSIS — N179 Acute kidney failure, unspecified: Secondary | ICD-10-CM | POA: Diagnosis not present

## 2020-06-20 LAB — BASIC METABOLIC PANEL
Anion gap: 9 (ref 5–15)
BUN: 22 mg/dL (ref 8–23)
CO2: 24 mmol/L (ref 22–32)
Calcium: 8.5 mg/dL — ABNORMAL LOW (ref 8.9–10.3)
Chloride: 105 mmol/L (ref 98–111)
Creatinine, Ser: 1.53 mg/dL — ABNORMAL HIGH (ref 0.61–1.24)
GFR, Estimated: 49 mL/min — ABNORMAL LOW (ref >60)
Glucose, Bld: 207 mg/dL — ABNORMAL HIGH (ref 70–99)
Potassium: 3.5 mmol/L (ref 3.5–5.1)
Sodium: 138 mmol/L (ref 135–145)

## 2020-06-20 LAB — GLUCOSE, CAPILLARY
Glucose-Capillary: 180 mg/dL — ABNORMAL HIGH (ref 70–99)
Glucose-Capillary: 162 mg/dL — ABNORMAL HIGH (ref 70–99)
Glucose-Capillary: 239 mg/dL — ABNORMAL HIGH (ref 70–99)
Glucose-Capillary: 181 mg/dL — ABNORMAL HIGH (ref 70–99)

## 2020-06-20 NOTE — Progress Notes (Signed)
Pt pleasantly confused. Kept pulling off condom catheter and Tele re and off medicated for agitation /delirium with little effect. Pt continue to get oob and pulled on tubes  Mitten  May not help . Pt needs Air cabin crew.

## 2020-06-20 NOTE — Progress Notes (Signed)
Inpatient Diabetes Program Recommendations  AACE/ADA: New Consensus Statement on Inpatient Glycemic Control (2015)  Target Ranges:  Prepandial:   less than 140 mg/dL      Peak postprandial:   less than 180 mg/dL (1-2 hours)      Critically ill patients:  140 - 180 mg/dL   Results for Steven Ingram, Steven Ingram (MRN [id number]) as of 06/20/2020 09:53  Ref. Range 06/19/2020 08:20 06/19/2020 12:05 06/19/2020 15:41 06/19/2020 21:00  Glucose-Capillary Latest Ref Range: 70 - 99 mg/dL 188 (H)  2 units NOVOLOG  243 (H)  3 units NOVOLOG  241 (H)  3 units NOVOLOG  272 (H)   Results for Steven Ingram, Steven Ingram (MRN [id number]) as of 06/20/2020 09:53  Ref. Range 06/20/2020 08:17  Glucose-Capillary Latest Ref Range: 70 - 99 mg/dL 180 (H)     History: Type 2 Diabetes  Home DM Meds: None listed  Current Orders: Novolog Sensitive Correction Scale/ SSI (0-9 units) TID AC     MD- Note CBGs >200.  Please consider:  Start Novolog Meal Coverage: Novolog 3 units TID with meals Hold if pt eats <50% of meal, Hold if pt NPO    --Will follow patient during hospitalization--  Wyn Quaker RN, MSN, CDE Diabetes Coordinator Inpatient Glycemic Control Team Team Pager: [phone] (8a-5p)

## 2020-06-20 NOTE — Procedures (Addendum)
Patient Name: Steven Ingram  MRN: [id number]  Epilepsy Attending: Lora Havens  Referring Physician/Provider: Dr Sharen Hones Date: 06/20/2020 Duration: 25.40 mins  Patient history: 70yo M with ams. EEG to evaluate for seizure  Level of alertness: Awake, asleep  AEDs during EEG study: None  Technical aspects: This EEG study was done with scalp electrodes positioned according to the 10-20 International system of electrode placement. Electrical activity was acquired at a sampling rate of '500Hz'$  and reviewed with a high frequency filter of '70Hz'$  and a low frequency filter of '1Hz'$ . EEG data were recorded continuously and digitally stored.   Description: The posterior dominant rhythm consists of 8-9 Hz activity of moderate voltage (25-35 uV) seen predominantly in posterior head regions, symmetric and reactive to eye opening and eye closing. Sleep was characterized by vertex waves, sleep spindles (12 to 14 Hz), maximal frontocentral region.  Physiologic photic driving was not seen during photic stimulation.  Hyperventilation and photic stimulation were not performed.     IMPRESSION: This study is within normal limits. No seizures or epileptiform discharges were seen throughout the recording.  [name] Barbra Sarks

## 2020-06-20 NOTE — Progress Notes (Signed)
Physical Therapy Treatment Patient Details Name: Steven Ingram MRN: [id number] DOB: 1950-08-15 Today's Date: 06/20/2020    History of Present Illness Patient is a 70 y.o. male with medical history significant for dementia, history of CVA, CKD stage III, s/p right nephrectomy, CAD, chronic diastolic CHF (EF 0000000,, T2DM, HTN, HLD, BPH who presents to the ED for evaluation of new onset of generalized weakness with shaking/jerking movements with with abnormal/slurred speech . No CT evidence for acute intracranial abnormality per report.    PT Comments    Patient pleasantly confused during session, oriented to self only but able to follow single step commands consistently. Patient continues to require assistance with mobility for safety and fall prevention. Patient declined ambulating today due to fatigue and required Min A- Mod A to stand multiple times from bed. Poor to fair standing balance. Alerted by LCSW that family is planning to bring the patient home at discharge. If patient is going home, recommend supervision/assistance for mobility for safety and HHPT if possible. Recommend to continue PT to maximize independence and address remaining functional limitations listed below.     Follow Up Recommendations  SNF;Supervision for mobility/OOB     Equipment Recommendations  Rolling walker with 5" wheels    Recommendations for Other Services       Precautions / Restrictions Precautions Precautions: Fall Restrictions Weight Bearing Restrictions: No    Mobility  Bed Mobility Overal bed mobility: Needs Assistance Bed Mobility: Supine to Sit     Supine to sit: Mod assist Sit to supine: Min assist   General bed mobility comments: assistance for LE and trunk support to sit upright. cues for task initiation. increased time required to complete tasks    Transfers Overall transfer level: Needs assistance   Transfers: Sit to/from Stand Sit to Stand: Min assist;Mod assist          General transfer comment: patient performed multiple sit to stand transfers with bed in lowest height position. Min A- Mod A required for lifting. verbal cues for technique  Ambulation/Gait Ambulation/Gait assistance: Min assist Gait Distance (Feet): 2 Feet Assistive device: 1 person hand held assist   Gait velocity: decreased   General Gait Details: patient able to take several side steps along edge of bed with steadying assistance. Min A provided for safety. Patient declined attempting to ambulate further due to fatigue. no significant change in heart rate noted with activity   Stairs             Wheelchair Mobility    Modified Rankin (Stroke Patients Only)       Balance           Standing balance support: Single extremity supported Standing balance-Leahy Scale: Poor Standing balance comment: Min A initially required to maintain standing balance with posterior lean. progressing to Min guard with repeated standing trials                            Cognition Arousal/Alertness: Lethargic (sleeping on arrival to room, wakes up to sound) Behavior During Therapy: [facility] for tasks assessed/performed Overall Cognitive Status: No family/caregiver present to determine baseline cognitive functioning                                 General Comments: patient oriented to self only this session. patient able to follow single step commands with increased time      Exercises  General Exercises - Lower Extremity Long Arc Quad: AAROM;Strengthening;Both;10 reps;Seated Heel Slides: AAROM;Strengthening;Both;10 reps;Supine Straight Leg Raises: AAROM;Strengthening;Both;10 reps;Supine Hip Flexion/Marching: AAROM;Strengthening;Both;10 reps;Seated Other Exercises Other Exercises: performed sit to stand x 5 reps for LE strengthening with cues for technique to facilitate independence with standing. verbal and visual cues for exercises technique for strengthening  exercises    General Comments        Pertinent Vitals/Pain Pain Assessment: No/denies pain    Home Living                      Prior Function            PT Goals (current goals can now be found in the care plan section) Acute Rehab PT Goals Patient Stated Goal: to go home PT Goal Formulation: With patient Time For Goal Achievement: 06/30/20 Potential to Achieve Goals: Fair Progress towards PT goals: Progressing toward goals    Frequency    Min 2X/week      PT Plan Current plan remains appropriate    Co-evaluation              AM-PAC PT "6 Clicks" Mobility   Outcome Measure  Help needed turning from your back to your side while in a flat bed without using bedrails?: A Little Help needed moving from lying on your back to sitting on the side of a flat bed without using bedrails?: A Lot Help needed moving to and from a bed to a chair (including a wheelchair)?: A Lot Help needed standing up from a chair using your arms (e.g., wheelchair or bedside chair)?: A Lot Help needed to walk in hospital room?: A Little Help needed climbing 3-5 steps with a railing? : A Lot 6 Click Score: 14    End of Session Equipment Utilized During Treatment: Gait belt Activity Tolerance: Patient tolerated treatment well Patient left: in bed;with call bell/phone within reach;with bed alarm set Nurse Communication: Mobility status PT Visit Diagnosis: Muscle weakness (generalized) (M62.81);Unsteadiness on feet (R26.81);Difficulty in walking, not elsewhere classified (R26.2)     Time: [id number] PT Time Calculation (min) (ACUTE ONLY): 23 min  Charges:  $Therapeutic Exercise: 8-22 mins $Therapeutic Activity: 8-22 mins                     Minna Merritts, PT, MPT    Percell Locus 06/20/2020, 2:11 PM

## 2020-06-20 NOTE — TOC Progression Note (Addendum)
Transition of Care [facility]) - Progression Note    Patient Details  Name: Steven Ingram MRN: [id number] Date of Birth: Aug 23, 1950  Transition of Care [facility]) CM/SW [place], LCSW Phone Number: 06/20/2020, 9:35 AM  Clinical Narrative:   CSW left VM for patient's wife requesting a return call to discuss SNF bed offers.  11:10- Call from Tammy at [facility]. Patient is in his SNF copay days. CSW will inform wife.   1:25- Left another VM for patient's wife.  1:32- Spoke to patient's wife and informed her that patient is in his copay days for short term rehab. She reported they cannot afford this, that patient will have to come home and resume [facility] services. Patient will need 24 hour care. She reported patient's brother can take care of patient while his wife is at dialysis 3 days a week. She reported they are not able to have patient come home today, but could be ready tomorrow if patient is medically ready. Updated MD, RN, PT, and OT. Updated [facility] Representative [place].   Patient to discharge home tomorrow with Home Health. Patient's wife is aware and said she will transport patient home.   Expected Discharge Plan: [place] Barriers to Discharge: Continued Medical Work up  Expected Discharge Plan and Services Expected Discharge Plan: [place] arrangements for the past 2 months: Single Family Home                                       Social Determinants of Health (SDOH) Interventions    Readmission Risk Interventions Readmission Risk Prevention Plan 10/04/2018  Post Dischage Appt Not Complete  Medication Screening Complete  Transportation Screening Complete

## 2020-06-20 NOTE — Progress Notes (Signed)
eeg done °

## 2020-06-20 NOTE — Progress Notes (Signed)
PROGRESS NOTE    Steven Ingram  [id number] DOB: Aug 05, 1950 DOA: 06/15/2020 PCP: [facility] [employer]   Chief complaint.  Shaking with generalized weakness Brief Narrative:  The patient is a 70 yr old man who presented to [facility] ED from home with complaints of new onset generalized weakness with shaking and jerks. The patient is demented at baseline. 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 Changes were noted on the morning of 3/2/20222. CT head negative for evidence of acute intracranial abnormality.  EEG is pending, MRI showed extensive small vessel ischemic changes.   Assessment & Plan:   Principal Problem:   Acute kidney injury superimposed on CKD ([name]) Active Problems:   History of CVA (cerebrovascular accident)   Type 2 diabetes mellitus with stage 3 chronic kidney disease (HCC)   CAD (coronary artery disease)   Hypertension associated with diabetes ([name])   Acute metabolic encephalopathy   Chronic diastolic CHF (congestive heart failure) (HCC)   Dementia without behavioral disturbance ([name])   Seizure-like activity ([name])   Hyperlipidemia associated with type 2 diabetes mellitus ([name])  #1.  Acute kidney injury on chronic kidney disease stage IIIa. Reviewed previous lab, patient baseline creatinine was 1.5.  Condition stable.  Renal function is better.  2.  Acute metabolic cephalopathy. Baseline dementia. Seizure-like activity. Generalized weakness. Patient MRI showed significant small vessel ischemia changes, consistent with baseline dementia.  Not able to reach family to discuss. Patient currently is confused, without agitation.  This is probably his baseline. Doubt seizure.  Will wait for EEG results. Pending SNF placement.  3.  Type 2 diabetes with hyperglycemia Continue current coverage.  4.  Essential hypertension. Chronic diastolic congestive heart  failure. History of CVA. Conditions are stable.    DVT prophylaxis: heparin Code Status: full Family Communication: wife updated.  Disposition Plan:  .   Status is: Inpatient  Remains inpatient appropriate because:Inpatient level of care appropriate due to severity of illness   Dispo: The patient is from: Home              Anticipated d/c is to: SNF              Patient currently is not medically stable to d/c.   Difficult to place patient No        I/O last 3 completed shifts: In: 1869.4 [P.O.:990; I.V.:879.4] Out: 750 [Urine:750] No intake/output data recorded.     Consultants:   None  Procedures: None  Antimicrobials: None  Subjective: Patient is confused, no agitation. Denies any short of breath or cough. No abdominal pain or nausea vomiting. No dysuria or hematuria  Objective: Vitals:   06/20/20 0029 06/20/20 0430 06/20/20 0500 06/20/20 0815  BP: (!) 158/71 (!) 160/78  (!) 159/105  Pulse: 86 83  88  Resp: '15 18  18  '$ Temp: 98.7 F (37.1 C) 98.5 F (36.9 C)  97.6 F (36.4 C)  TempSrc: Oral Oral    SpO2: 100% 98%  (!) 88%  Weight:   85.5 kg   Height:        Intake/Output Summary (Last 24 hours) at 06/20/2020 1046 Last data filed at 06/20/2020 0600 Gross per 24 hour  Intake 1509.41 ml  Output 750 ml  Net 759.41 ml   Filed Weights   06/15/20 1801 06/16/20 1404 06/20/20 0500  Weight: 89.4 kg 88 kg 85.5 kg  Examination:  General exam: Appears calm and comfortable  Respiratory system: Clear to auscultation. Respiratory effort normal. Cardiovascular system: S1 & S2 heard, RRR. No JVD, murmurs, rubs, gallops or clicks. No pedal edema. Gastrointestinal system: Abdomen is nondistended, soft and nontender. No organomegaly or masses felt. Normal bowel sounds heard. Central nervous system: Alert and oriented x1. No focal neurological deficits. Extremities: Symmetric 5 x 5 power. Skin: No rashes, lesions or ulcers     Data Reviewed: I have  personally reviewed following labs and imaging studies  CBC: Recent Labs  Lab 06/15/20 1804 06/16/20 0328 06/19/20 0442  WBC 4.1 5.5 5.1  NEUTROABS 2.1  --  2.6  HGB 11.0* 11.4* 12.0*  HCT 33.2* 33.7* 34.1*  MCV 96.8 96.6 95.0  PLT 215 207 [id number]   Basic Metabolic Panel: Recent Labs  Lab 06/15/20 1804 06/16/20 0328 06/19/20 0442 06/20/20 0650  NA 135 139 140 138  K 4.2 3.5 3.7 3.5  CL 105 107 107 105  CO2 '24 25 25 24  '$ GLUCOSE 342* 143* 203* 207*  BUN 27* 24* 26* 22  CREATININE 1.88* 1.69* 1.85* 1.53*  CALCIUM 8.9 8.9 9.0 8.5*  MG  --  1.7  --   --   PHOS  --  3.4  --   --    GFR: Estimated Creatinine Clearance: 48.7 mL/min (A) (by C-G formula based on SCr of 1.53 mg/dL (H)). Liver Function Tests: Recent Labs  Lab 06/15/20 1804 06/16/20 0328 06/19/20 0442  AST 14* 15 13*  ALT '13 13 13  '$ ALKPHOS 39 38 42  BILITOT 0.6 1.0 0.9  PROT 6.1* 6.0* 6.0*  ALBUMIN 3.2* 3.1* 3.0*   No results for input(s): LIPASE, AMYLASE in the last 168 hours. Recent Labs  Lab 06/16/20 0008  AMMONIA 13   Coagulation Profile: Recent Labs  Lab 06/15/20 1804  INR 1.0   Cardiac Enzymes: Recent Labs  Lab 06/16/20 0008  CKTOTAL 130   BNP (last 3 results) No results for input(s): PROBNP in the last 8760 hours. HbA1C: No results for input(s): HGBA1C in the last 72 hours. CBG: Recent Labs  Lab 06/19/20 0820 06/19/20 1205 06/19/20 1541 06/19/20 2100 06/20/20 0817  GLUCAP 188* 243* 241* 272* 180*   Lipid Profile: No results for input(s): CHOL, HDL, LDLCALC, TRIG, CHOLHDL, LDLDIRECT in the last 72 hours. Thyroid Function Tests: No results for input(s): TSH, T4TOTAL, FREET4, T3FREE, THYROIDAB in the last 72 hours. Anemia Panel: No results for input(s): VITAMINB12, FOLATE, FERRITIN, TIBC, IRON, RETICCTPCT in the last 72 hours. Sepsis Labs: No results for input(s): PROCALCITON, LATICACIDVEN in the last 168 hours.  Recent Results (from the past 240 hour(s))  SARS CORONAVIRUS  2 (TAT 6-24 HRS) Nasopharyngeal Nasopharyngeal Swab     Status: None   Collection Time: 06/16/20  1:40 AM   Specimen: Nasopharyngeal Swab  Result Value Ref Range Status   SARS Coronavirus 2 NEGATIVE NEGATIVE Final    Comment: (NOTE) SARS-CoV-2 target nucleic acids are NOT DETECTED.  The SARS-CoV-2 RNA is generally detectable in upper and lower respiratory specimens during the acute phase of infection. Negative results do not preclude SARS-CoV-2 infection, do not rule out co-infections with other pathogens, and should not be used as the sole basis for treatment or other patient management decisions. Negative results must be combined with clinical observations, patient history, and epidemiological information. The expected result is Negative.  Fact Sheet for Patients: SugarRoll.be  Fact Sheet for Healthcare Providers: https://www.woods-mathews.com/  This test is not yet approved  or cleared by the [place] and  has been authorized for detection and/or diagnosis of SARS-CoV-2 by FDA under an Emergency Use Authorization (EUA). This EUA will remain  in effect (meaning this test can be used) for the duration of the COVID-19 declaration under Se ction 564(b)(1) of the Act, 21 U.S.C. section 360bbb-3(b)(1), unless the authorization is terminated or revoked sooner.  Performed at [facility], [place] [address], [place], [place] [address]          Radiology Studies: No results found.      Scheduled Meds: . amLODipine  10 mg Oral Daily  . atorvastatin  80 mg Oral q1800  . Chlorhexidine Gluconate Cloth  6 each Topical Daily  . clopidogrel  75 mg Oral Daily  . diltiazem  60 mg Oral Q8H  . heparin  5,000 Units Subcutaneous Q8H  . insulin aspart  0-9 Units Subcutaneous TID WC  . risperiDONE  0.5 mg Oral Daily  . sertraline  25 mg Oral Daily  . sodium chloride flush  3 mL Intravenous Once  . sodium chloride flush  3 mL  Intravenous Q12H  . tamsulosin  0.4 mg Oral BID  . vitamin B-12  1,000 mcg Oral Daily   Continuous Infusions: . sodium chloride 75 mL/hr at 06/19/20 1534     LOS: 3 days    Time spent: 28 minutes    Sharen Hones, MD [facility]   To contact the attending provider between 7A-7P or the covering provider during after hours 7P-7A, please log into the web site [email or web address] and access using universal [place] password for that web site. If you do not have the password, please call the hospital operator.  06/20/2020, 10:46 AM

## 2020-06-21 DIAGNOSIS — R531 Weakness: Secondary | ICD-10-CM | POA: Diagnosis not present

## 2020-06-21 DIAGNOSIS — F0390 Unspecified dementia without behavioral disturbance: Secondary | ICD-10-CM | POA: Diagnosis not present

## 2020-06-21 DIAGNOSIS — N179 Acute kidney failure, unspecified: Secondary | ICD-10-CM | POA: Diagnosis not present

## 2020-06-21 DIAGNOSIS — N189 Chronic kidney disease, unspecified: Secondary | ICD-10-CM | POA: Diagnosis not present

## 2020-06-21 LAB — BASIC METABOLIC PANEL
Anion gap: 8 (ref 5–15)
BUN: 20 mg/dL (ref 8–23)
CO2: 21 mmol/L — ABNORMAL LOW (ref 22–32)
Calcium: 8.2 mg/dL — ABNORMAL LOW (ref 8.9–10.3)
Chloride: 109 mmol/L (ref 98–111)
Creatinine, Ser: 1.38 mg/dL — ABNORMAL HIGH (ref 0.61–1.24)
GFR, Estimated: 55 mL/min — ABNORMAL LOW (ref >60)
Glucose, Bld: 164 mg/dL — ABNORMAL HIGH (ref 70–99)
Potassium: 3.5 mmol/L (ref 3.5–5.1)
Sodium: 138 mmol/L (ref 135–145)

## 2020-06-21 LAB — GLUCOSE, CAPILLARY
Glucose-Capillary: 148 mg/dL — ABNORMAL HIGH (ref 70–99)
Glucose-Capillary: 167 mg/dL — ABNORMAL HIGH (ref 70–99)
Glucose-Capillary: 265 mg/dL — ABNORMAL HIGH (ref 70–99)

## 2020-06-21 LAB — MAGNESIUM: Magnesium: 1.7 mg/dL (ref 1.7–2.4)

## 2020-06-21 MED ORDER — METOPROLOL TARTRATE 50 MG PO TABS: 50.0000 mg | ORAL_TABLET | Freq: Every day | ORAL | 0 refills | Status: DC

## 2020-06-21 MED ORDER — CYANOCOBALAMIN 1000 MCG PO TABS: 1000.0000 ug | ORAL_TABLET | Freq: Every day | ORAL | 0 refills | Status: DC

## 2020-06-21 NOTE — Discharge Summary (Signed)
Physician Discharge Summary  Patient ID: Steven Ingram MRN: [id number] DOB/AGE: [birth year] 70 y.o.  Admit date: 06/15/2020 Discharge date: 06/21/2020  Admission Diagnoses:  Discharge Diagnoses:  Principal Problem:   Acute kidney injury superimposed on CKD [facility]) Active Problems:   History of CVA (cerebrovascular accident)   Type 2 diabetes mellitus with stage 3 chronic kidney disease (HCC)   CAD (coronary artery disease)   Hypertension associated with diabetes ([place])   Acute metabolic encephalopathy   Chronic diastolic CHF (congestive heart failure) ([place])   Dementia without behavioral disturbance ([place])   Seizure-like activity ([place])   Hyperlipidemia associated with type 2 diabetes mellitus ([place])   Discharged Condition: fair  Hospital Course:  The patient is a 70 yr old man who presented to [facility] ED from home with complaints of new onset generalized weakness with shaking and jerks. The patient is demented at baseline. 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 Changes were noted on the morning of 3/2/20222. CT head negative for evidence of acute intracranial abnormality.  EEG showed no seizure activity, MRI showed extensive small vessel ischemic changes. Patient was initially pending SNF placement, however, patient family could not afford the copayment for SNF, they decided to take patient home.  #1.  Acute kidney injury on chronic kidney disease stage IIIa. Reviewed previous lab, patient baseline creatinine was 1.5.  Condition stable.  Renal function is better.  2.  Acute metabolic cephalopathy. Baseline dementia. Seizure-like activity. Generalized weakness. Patient MRI showed significant small vessel ischemia changes, consistent with baseline dementia.   Patient currently is confused, without agitation.    Discussed with patient's wife, patient is normally disoriented to place and  person. EEG did not show any significant seizure activity. Condition seem to be back to baseline.  3.  Type 2 diabetes with hyperglycemia Continue current coverage.  4.  Essential hypertension. Chronic diastolic congestive heart failure. History of CVA. Conditions are stable.  Consults: None  Significant Diagnostic Studies:  CT HEAD WITHOUT CONTRAST  TECHNIQUE: Contiguous axial images were obtained from the base of the skull through the vertex without intravenous contrast.  COMPARISON:  MRI 04/02/2020, CT 03/31/2020  FINDINGS: Brain: Mild motion degradation. No acute territorial infarction, hemorrhage or intracranial mass. Atrophy. Moderate hypodensity in the white matter consistent with chronic small vessel ischemic change. Chronic lacunar infarcts within the thalamus, basal ganglia and right cerebellum. Stable ventricle size.  Vascular: No hyperdense vessels. Vertebral and carotid vascular calcification  Skull: Normal. Negative for fracture or focal lesion.  Sinuses/Orbits: No acute finding.  Other: None  IMPRESSION: 1. No CT evidence for acute intracranial abnormality. 2. Atrophy and chronic small vessel ischemic changes of the white matter.   Electronically Signed   By: Donavan Foil M.D.   On: 06/15/2020 18:28   Treatments: IVF  Discharge Exam: Blood pressure (!) 152/83, pulse 74, temperature 98 F (36.7 C), resp. rate 18, height '5\' 9"'$  (1.753 m), weight 87 kg, SpO2 100 %. General appearance: alert, cooperative and Alert and oriented to himself Resp: clear to auscultation bilaterally Cardio: regular rate and rhythm, S1, S2 normal, no murmur, click, rub or gallop GI: soft, non-tender; bowel sounds normal; no masses,  no organomegaly Extremities: extremities normal, atraumatic, no cyanosis or edema  Disposition: Discharge disposition: 01-Home or Self Care       Discharge Instructions    Diet - low sodium heart healthy  Complete by:  As directed    Increase activity slowly   Complete by: As directed      Allergies as of 06/21/2020      Reactions   Ciprofloxacin Other (See Comments)   Possibly contributed to seizure-like activity resulting in 06/15/20 admission       Medication List    STOP taking these medications   diltiazem 120 MG tablet Commonly known as: CARDIZEM   lisinopril 40 MG tablet Commonly known as: ZESTRIL     TAKE these medications   amLODipine 10 MG tablet Commonly known as: NORVASC Take 10 mg by mouth daily.   atorvastatin 80 MG tablet Commonly known as: LIPITOR Take 80 mg by mouth daily.   clopidogrel 75 MG tablet Commonly known as: PLAVIX Take 1 tablet (75 mg total) by mouth daily.   cyanocobalamin 1000 MCG tablet Take 1 tablet (1,000 mcg total) by mouth daily. Start taking on: June 22, 2020   metoprolol tartrate 50 MG tablet Commonly known as: Lopressor Take 1 tablet (50 mg total) by mouth daily.   risperiDONE 1 MG tablet Commonly known as: RISPERDAL Take 1 mg by mouth daily.   sertraline 25 MG tablet Commonly known as: ZOLOFT Take 25 mg by mouth daily.   tamsulosin 0.4 MG Caps capsule Commonly known as: FLOMAX Take 0.4 mg by mouth 2 (two) times daily.   Vitamin D (Ergocalciferol) 1.25 MG (50000 UNIT) Caps capsule Commonly known as: DRISDOL Take 50,000 Units by mouth once a week.       [place] Follow up in 1 week(s).   Contact information: [place] [address] [phone]              More than 30 minutes Signed: Sharen Hones 06/21/2020, 9:56 AM

## 2020-06-21 NOTE — TOC Progression Note (Addendum)
Transition of Care [facility]) - Progression Note    Patient Details  Name: Steven Ingram MRN: [id number] Date of Birth: 10-21-1950  Transition of Care [facility]) CM/SW [place], LCSW Phone Number: 06/21/2020, 10:47 AM  Clinical Narrative:   Patient to discharge home today. Per RN needs EMS transport. Called patient's wife who reported she has dialysis today and won't be home until 3-4 pm. She said she will call CSW when she gets home so CSW can call EMS. CSW updated RN.  EMS paperwork placed in DC folder.  Informed [place] with [facility] of plan to DC today.    Expected Discharge Plan: [place] Barriers to Discharge: Continued Medical Work up  Expected Discharge Plan and Services Expected Discharge Plan: [place] arrangements for the past 2 months: Single Family Home Expected Discharge Date: 06/21/20                                     Social Determinants of Health (SDOH) Interventions    Readmission Risk Interventions Readmission Risk Prevention Plan 10/04/2018  Post Dischage Appt Not Complete  Medication Screening Complete  Transportation Screening Complete

## 2020-06-21 NOTE — Progress Notes (Signed)
EMS [place] called by Care RN to schedule pick up for patient.

## 2020-06-21 NOTE — Care Management Important Message (Signed)
Important Message  Patient Details  Name: Steven Ingram MRN: [id number] Date of Birth: 01/31/1951   Medicare Important Message Given:  Yes     Juliann Pulse A Ericka Marcellus 06/21/2020, 11:36 AM

## 2020-06-21 NOTE — Progress Notes (Signed)
Patient transferred to 3B holding unit to await discharge via EMS in stable condition.

## 2020-06-21 NOTE — Progress Notes (Signed)
Patient discharged to home with home health with [facility] and all belongings.  PIV removed, no bleeding, intact.  Patient A+Ox1.  VSS.  Room Air.  Care RN will call wife to notify patient on the way home.

## 2020-06-21 NOTE — Plan of Care (Signed)

## 2020-06-21 NOTE — Plan of Care (Signed)
  Problem: Health Behavior/Discharge Planning: Goal: Ability to manage health-related needs will improve Outcome: Progressing   Problem: Clinical Measurements: Goal: Respiratory complications will improve Outcome: Progressing   Problem: Coping: Goal: Level of anxiety will decrease Outcome: Progressing   Problem: Pain Managment: Goal: General experience of comfort will improve Outcome: Progressing   Problem: Safety: Goal: Ability to remain free from injury will improve Outcome: Progressing   Problem: Skin Integrity: Goal: Risk for impaired skin integrity will decrease Outcome: Progressing

## 2020-07-08 ENCOUNTER — Encounter: Payer: Self-pay | Admitting: *Deleted

## 2020-08-23 ENCOUNTER — Telehealth: Payer: Self-pay | Admitting: Primary Care

## 2020-08-23 NOTE — Telephone Encounter (Signed)
Called patient's wife's cell, to offer to schedule a Palliative Consult, no answer and unable to leave message due to mailbox was full.  I then called home #, with no answer - left message with reason for call along with my name and call back number

## 2020-08-24 ENCOUNTER — Telehealth: Payer: Self-pay | Admitting: Primary Care

## 2020-08-24 NOTE — Telephone Encounter (Signed)
Returned call to patient's wife, [name], regarding the Palliative referral, discussed services with her and she was in agreement with scheduling visit.  I have scheduled an In-home Consult for 09/19/20 @ 3 PM.

## 2020-09-19 ENCOUNTER — Other Ambulatory Visit: Payer: Medicare PPO | Admitting: Primary Care

## 2020-09-19 ENCOUNTER — Other Ambulatory Visit: Payer: Self-pay

## 2020-09-28 ENCOUNTER — Other Ambulatory Visit: Payer: Self-pay

## 2020-09-28 ENCOUNTER — Other Ambulatory Visit: Payer: Medicare PPO | Admitting: Primary Care

## 2020-09-28 DIAGNOSIS — Z8673 Personal history of transient ischemic attack (TIA), and cerebral infarction without residual deficits: Secondary | ICD-10-CM

## 2020-09-28 DIAGNOSIS — I639 Cerebral infarction, unspecified: Secondary | ICD-10-CM

## 2020-09-28 DIAGNOSIS — N1831 Chronic kidney disease, stage 3a: Secondary | ICD-10-CM

## 2020-09-28 DIAGNOSIS — E1122 Type 2 diabetes mellitus with diabetic chronic kidney disease: Secondary | ICD-10-CM

## 2020-09-28 DIAGNOSIS — R2681 Unsteadiness on feet: Secondary | ICD-10-CM

## 2020-09-28 DIAGNOSIS — I5032 Chronic diastolic (congestive) heart failure: Secondary | ICD-10-CM

## 2020-09-28 DIAGNOSIS — I69391 Dysphagia following cerebral infarction: Secondary | ICD-10-CM

## 2020-09-28 DIAGNOSIS — I6930 Unspecified sequelae of cerebral infarction: Secondary | ICD-10-CM | POA: Insufficient documentation

## 2020-09-28 DIAGNOSIS — Z515 Encounter for palliative care: Secondary | ICD-10-CM

## 2020-09-28 NOTE — Progress Notes (Signed)
[facility] Pall[facility] Care Consult Note Telephone: [phone]  Fax: [phone]    Date of encounter: 09/28/20 PATIENT NAME: Steven Ingram [address] In[address] [place] [address]   [phone] (home)  DOB: 16-Jan-1951 MRN: [id number] PRIMARY CARE PROVIDER:    Inc, [facility],  [place] [address] [phone]  REFERRING PROVIDER:   Inc, [facility] Steven Ingram,  [place] [address] [phone]  RESPONSIBLE PARTY:    Contact Information     Name Relation Home Work Steven Ingram, Steven Ingram Spouse   [phone]   Steven Ingram Daughter   [phone]        I met face to face with patient and family in the home. Palliative Care was asked to follow this patient by consultation request of  Inc, [place] to address advance care planning and complex medical decision making. This is the initial visit.                                     ASSESSMENT AND PLAN / RECOMMENDATIONS:   Advance Care Planning/Goals of Care: Goals include to maximize quality of life and symptom management. Our advance care planning conversation included a discussion about:    The value and importance of advance care planning  Experiences with loved ones who have been seriously ill or have died  Exploration of personal, cultural or spiritual beliefs that might influence medical decisions  Exploration of goals of care in the event of a sudden injury or illness  Identification of a healthcare agent - Wife Scientist, water quality of an  advance directive document . CODE STATUS: Full code, limited interventions ACP: Both have children from previous marriages but wife states she is POA. We discuss advance care plans. She states their living will and powers of attorney are in process. We reviewed the MOST  form and wife also requested a MOST  form for her care.  She completed his as well as his proxy, as he does not have capacity  to make decisions at this time. These will be uploaded into the [facility]  system and hardcopies were left with the patient and his wife.   Symptom Management/Plan:  Dementia: I met with patient and his wife in their home. Wife request some caregiver assistance and help with managing his symptoms of dementia. She reports he is intermittently unable to ambulate and  feels he's choosing not to do certain functions. Some days he can ambulate some days he cannot. Today he presents fully dressed, able to ambulate from the bedroom to the living room and is able to interact with the conversation somewhat appropriately.   Dysphagia: He's had some trouble swallowing, which is another one of her concerns. Dysphagia apparently dates back to the winter and early spring 2021-22 when he had a hospital stay with strokes and sequelae. I reviewed a speech therapy note whereby he was approved for a minced diet and thin liquids by cup only.  She said he's been using a straw and getting aspirated on the liquids. I encouraged her not to use the straw per speech recommendation but the cup only.   We also discussed his pills. She states he has a lot of coughing and choking with taking his pills. She crushes them but has been giving them straight. I suggested she put them into some applesauce or pudding  or similar and see if he could take those easier. She felt this may work.    Caregiver strain: We discussed his care needs. I also gave her some resources for dementia caregiving. He is currently not applied for Medicaid no has  any hired caregivers for ADLs. His brother comes and sits with him while wife goes to dialysis Tuesday, Thursdays and Saturdays. We discussed that he may need a personal care provider for personal care. I directed her to DSS Medicaid workers and or elder care lawyer to discuss resources and ways to pay for additional caregivers for ADLs. We discussed that caregiving needs could escalate  as they both have  health it could decline. She will consider this.   Medication reviewed we went over the medicines that he has in his med list. He's currently taking lisinopril and has stopped amlodipine;  it is not clear if he should still be on metoprolol.  He is also seeking a new primary provider due to his last one leaving. Wife is looking for an appointment for that now. He's also known to nephrology and neurology.   Nutrition:  patient's weight is stable although wife endorses he's not eating as well as he used to. According to speech report he does have a very delayed oral phase and so I encouraged her to let him eat very slowly and to make sure he could chew the foods, that they were soft or well cut up. I will monitor his weight. She did state that he had some muscle mass loss, which is consistent with the dementia process. He endorses his appetite was good and his bowels were regular.  Follow up Palliative Care Visit: Palliative care will continue to follow for complex medical decision making, advance care planning, and clarification of goals. Return 6-8 weeks or prn.  I spent 75 minutes providing this consultation. More than 50% of the time in this consultation was spent in counseling and care coordination.  PPS: 40%  HOSPICE ELIGIBILITY/DIAGNOSIS: TBD  Chief Complaint: dysphagia  HISTORY OF PRESENT ILLNESS:  Steven Ingram is a 70 y.o. year old male  with h/o cva, dementia, recent dysphagia. Wife states crushed pills cause him to choke and sometimes vomit. He is also eating less and has trouble with thin liquids through a straw .   History obtained from review of EMR, discussion with primary team, and interview with family, facility staff/caregiver and/or Steven Ingram.  I reviewed available labs, medications, imaging, studies and related documents from the EMR.  Records reviewed and summarized above.   ROS/wife   General: NAD EYES: denies vision changes ENMT: endorses dysphagia Cardiovascular:  denies chest pain, denies DOE Pulmonary: denies cough, denies increased SOB Abdomen: endorses good appetite, denies constipation, endorses incontinence of bowel GU: denies dysuria, endorses incontinence of urine MSK:  endorses  weakness,  occ bradykinesia, no falls reported Skin: denies rashes or wounds Neurological: denies pain, denies insomnia, endorses somnolence Psych: Endorses positive mood Heme/lymph/immuno: denies bruises, abnormal bleeding  Physical Exam: Current and past weights: 180 lb estimated Constitutional: NAD, 134/78 HR 74 RR 18 General: frail appearing, WNWD EYES: anicteric sclera, lids intact, no discharge  ENMT: intact hearing, oral mucous membranes moist, dentition intact CV: S1S2, RRR, slight bil  LE edema Pulmonary: LCTA, no increased work of breathing, no cough, room air Abdomen: intake 75%, no ascites GU: deferred MSK: mild  sarcopenia, moves all extremities, ambulatory with walker and cue Skin: warm and dry, no rashes or wounds on visible skin Neuro:  mod generalized weakness,  severe cognitive impairment Psych: non-anxious affect, A and O x 1 Hem/lymph/immuno: no widespread bruising   CURRENT PROBLEM LIST:  Patient Active Problem List   Diagnosis Date Noted   Seizure-like activity ([place]) 06/15/2020   Hyperlipidemia associated with type 2 diabetes mellitus ([place]) 06/15/2020   Dementia without behavioral disturbance (HCC)    Hypokalemia    Encephalopathy acute 61/60/7371   Acute metabolic encephalopathy 10/10/9483   SIRS (systemic inflammatory response syndrome) ([place]) 03/31/2020   Acute kidney injury superimposed on CKD ([place]) 03/31/2020   HLD (hyperlipidemia) 03/31/2020   Chronic diastolic CHF (congestive heart failure) ([place]) 03/31/2020   Malnutrition of moderate degree 07/20/2019   Dizziness 07/19/2019   Unsteady gait when walking 07/19/2019   History of CVA (cerebrovascular accident) 07/19/2019   Type 2 diabetes mellitus with stage 3 chronic kidney  disease ([place]) 07/19/2019   CAD (coronary artery disease) 07/19/2019   Hypertension associated with diabetes ([place]) 07/19/2019   H/O right nephrectomy 07/19/2019   Left pontine stroke ([place]) 07/19/2019   CVA (cerebral vascular accident) ([place]) 10/03/2018   PAST MEDICAL HISTORY:  Active Ambulatory Problems    Diagnosis Date Noted   CVA (cerebral vascular accident) ([place]) 10/03/2018   Dizziness 07/19/2019   Unsteady gait when walking 07/19/2019   History of CVA (cerebrovascular accident) 07/19/2019   Type 2 diabetes mellitus with stage 3 chronic kidney disease ([place]) 07/19/2019   CAD (coronary artery disease) 07/19/2019   Hypertension associated with diabetes ([place]) 07/19/2019   H/O right nephrectomy 07/19/2019   Left pontine stroke ([place]) 07/19/2019   Malnutrition of moderate degree 46/27/0350   Acute metabolic encephalopathy 09/38/1829   SIRS (systemic inflammatory response syndrome) ([place]) 03/31/2020   Acute kidney injury superimposed on CKD ([place]) 03/31/2020   HLD (hyperlipidemia) 03/31/2020   Chronic diastolic CHF (congestive heart failure) ([place]) 03/31/2020   Encephalopathy acute 04/01/2020   Dementia without behavioral disturbance ([place])    Hypokalemia    Seizure-like activity ([place]) 06/15/2020   Hyperlipidemia associated with type 2 diabetes mellitus ([place]) 06/15/2020   Resolved Ambulatory Problems    Diagnosis Date Noted   No Resolved Ambulatory Problems   Past Medical History:  Diagnosis Date   Diabetes mellitus without complication ([place])    High cholesterol    HTN (hypertension) 07/19/2019   Hypertension    SOCIAL HX:  Social History   Tobacco Use   Smoking status: Every Day    Packs/day: 1.00    Years: 53.00    Pack years: 53.00    Types: Cigarettes   Smokeless tobacco: Never  Substance Use Topics   Alcohol use: Yes    Comment: last drink yesterday   FAMILY HX:  Family History  Problem Relation Age of Onset   Diabetes Mellitus II Mother    Hypertension Mother     Diabetes Mellitus II Father    Hypertension Father    Prostate cancer Neg Hx    Bladder Cancer Neg Hx    Kidney cancer Neg Hx       ALLERGIES:  Allergies  Allergen Reactions   Ciprofloxacin Other (See Comments)    Possibly contributed to seizure-like activity resulting in 06/15/20 admission      PERTINENT MEDICATIONS:  Outpatient Encounter Medications as of 09/28/2020  Medication Sig   atorvastatin (LIPITOR) 80 MG tablet Take 80 mg by mouth daily.   clopidogrel (PLAVIX) 75 MG tablet Take 1 tablet (75 mg total) by mouth daily.   lisinopril (ZESTRIL) 40 MG tablet Take 40 mg  by mouth daily.   risperiDONE (RISPERDAL) 1 MG tablet Take 1 mg by mouth daily.   sertraline (ZOLOFT) 25 MG tablet Take 25 mg by mouth daily.   tamsulosin (FLOMAX) 0.4 MG CAPS capsule Take 0.4 mg by mouth 2 (two) times daily.   Vitamin D, Ergocalciferol, (DRISDOL) 1.25 MG (50000 UNIT) CAPS capsule Take 50,000 Units by mouth every 30 (thirty) days.   metoprolol tartrate (LOPRESSOR) 50 MG tablet Take 1 tablet (50 mg total) by mouth daily. (Patient not taking: Reported on 09/28/2020)   vitamin B-12 1000 MCG tablet Take 1 tablet (1,000 mcg total) by mouth daily. (Patient not taking: Reported on 09/28/2020)   [DISCONTINUED] amLODipine (NORVASC) 10 MG tablet Take 10 mg by mouth daily.   No facility-administered encounter medications on file as of 09/28/2020.    Thank you for the opportunity to participate in the care of Mr. Lembke.  The palliative care team will continue to follow. Please call our office at [phone] if we can be of additional assistance.   Jason Coop, NP , DNP, MPH, AGPCNP-BC, ACHPN  COVID-19 PATIENT SCREENING TOOL Asked and negative response unless otherwise noted:   Have you had symptoms of covid, tested positive or been in contact with someone with symptoms/positive test in the past 5-10 days?

## 2020-11-07 ENCOUNTER — Telehealth: Payer: Self-pay | Admitting: Primary Care

## 2020-11-07 ENCOUNTER — Other Ambulatory Visit: Payer: Self-pay

## 2020-11-07 ENCOUNTER — Other Ambulatory Visit: Payer: Medicare PPO | Admitting: Primary Care

## 2020-11-07 NOTE — Telephone Encounter (Signed)
Message left RE visit on Friday, Monday arrived at home at visit time. Wife states they are going out and cannot keep appointment. Card given, she will call to reschedule. No Show Visit.

## 2020-11-25 ENCOUNTER — Telehealth: Payer: Self-pay | Admitting: Primary Care

## 2020-11-25 NOTE — Telephone Encounter (Signed)
Spoke with patient's wife and have rescheduled the Palliative f/u visit to 11/30/20 @ 3 PM.

## 2020-11-30 ENCOUNTER — Other Ambulatory Visit: Payer: Self-pay

## 2020-11-30 ENCOUNTER — Other Ambulatory Visit: Payer: Medicare PPO | Admitting: Primary Care

## 2020-11-30 DIAGNOSIS — E1122 Type 2 diabetes mellitus with diabetic chronic kidney disease: Secondary | ICD-10-CM

## 2020-11-30 DIAGNOSIS — R2681 Unsteadiness on feet: Secondary | ICD-10-CM

## 2020-11-30 DIAGNOSIS — Z8673 Personal history of transient ischemic attack (TIA), and cerebral infarction without residual deficits: Secondary | ICD-10-CM

## 2020-11-30 DIAGNOSIS — N1831 Chronic kidney disease, stage 3a: Secondary | ICD-10-CM

## 2020-11-30 NOTE — Progress Notes (Signed)
[facility] Palliative Care Consult Note Telephone: [phone]  Fax: [phone]    Date of encounter: 11/30/20 PATIENT NAME: Steven Ingram [name] [address]   [phone] (home)  DOB: 1951/02/17 MRN: [id number] PRIMARY CARE PROVIDER:    Inc, [facility],  [place] [address] [phone]  REFERRING PROVIDER:   Inc, [facility] [address],  [place] [address] [phone]  RESPONSIBLE PARTY:    Contact Information     Name Relation Home Work Ingram, Steven Catalina Spouse   [phone]   Steven Ingram Daughter   [phone]        I met face to face with patient and family in  home. Palliative Care was asked to follow this patient by consultation request of  Inc, [place] to address advance care planning and complex medical decision making. This is a follow up visit.                                   ASSESSMENT AND PLAN / RECOMMENDATIONS:   Advance Care Planning/Goals of Care: Goals include to maximize quality of life and symptom management. Our advance care planning conversation included a discussion about:   Exploration of personal, cultural or spiritual beliefs that might influence medical decisions   Symptom Management/Plan:  Dysphagia: Ongoing from CVA, has trouble with meats. Has lost 20 lbs and endorses waning appetite. Using high protein supplements. Made suggestion to have ST assess if needed, or purchase food processor to mince meats smaller.  Mobility: Able to ambulate in home. Dressed and interactive today but adds only occasionally to conversation. PCP has suggested [facility] again and wife is eager to apply now and get some in home assistance. Pt is alone while wife is at HD and he is not getting meals, oversight or socialization.   Med review: I have sent risperidone [facility] to [facility], daily at [facility], #30, as wife showed me AVS of recent PCP visit.  She is trying to quarter 1 mg tabs. She has some confusion over 0.5 and 0.25 mg, so I have sent the 0.25 tabs only and she knows to discard the 1 mg and use one pill of the 0.25 mg. She endorses min. Agitation. He is continuing sertraline 25 mg daily. She endorses they cannot pay for new jardiance prescription.  Follow up Palliative Care Visit: Palliative care will continue to follow for complex medical decision making, advance care planning, and clarification of goals. Return 6-8 weeks or prn.  I spent 60 minutes providing this consultation. More than 50% of the time in this consultation was spent in counseling and care coordination.  PPS: 40%  HOSPICE ELIGIBILITY/DIAGNOSIS: TBD  Chief Complaint: dementia, weight loss, dysphagia  HISTORY OF PRESENT ILLNESS:  Steven Ingram is a 70 y.o. year old male  with h/o cva, DM, ataxia, weight loss .   History obtained from review of EMR, discussion with primary team, and interview with family, facility staff/caregiver and/or Steven Ingram.  I reviewed available labs, medications, imaging, studies and related documents from the EMR.  Records reviewed and summarized above.   ROS  General: NAD ENMT: endorses dysphagia Cardiovascular: denies chest pain, denies DOE Pulmonary: denies cough, denies increased SOB Abdomen: endorses fair appetite, denies constipation, endorses incontinence of bowel GU: denies dysuria, endorses incontinence of urine MSK:  endorses  weakness,  no falls reported Skin: denies rashes or wounds Neurological: denies pain, denies insomnia Psych: Endorses positive mood Heme/lymph/immuno: denies bruises, abnormal bleeding  Physical Exam: Current and past weights: 161, 20 lb loss since 4/22.  Constitutional: NAD General: frail appearing, thin EYES: anicteric sclera, lids intact, no discharge  ENMT: intact hearing, oral mucous membranes moist CV: no LE edema Pulmonary:  no increased work of breathing, no cough, room  air Abdomen: intake 50%, no ascites GU: deferred MSK: mod sarcopenia, moves all extremities, ambulatory Skin: warm and dry, no rashes or wounds on visible skin Neuro:  ++generalized weakness,  ++ cognitive impairment Psych: non-anxious affect, A and O x 1-2 Hem/lymph/immuno: no widespread bruising  Thank you for the opportunity to participate in the care of Steven Ingram.  The palliative care team will continue to follow. Please call our office at [phone] if we can be of additional assistance.   Jason Coop, NP , [facility]  COVID-19 PATIENT SCREENING TOOL Asked and negative response unless otherwise noted:   Have you had symptoms of covid, tested positive or been in contact with someone with symptoms/positive test in the past 5-10 days?

## 2020-12-11 ENCOUNTER — Emergency Department: Payer: Medicare PPO

## 2020-12-11 ENCOUNTER — Other Ambulatory Visit: Payer: Self-pay

## 2020-12-11 ENCOUNTER — Inpatient Hospital Stay
Admission: EM | Admit: 2020-12-11 | Discharge: 2020-12-16 | DRG: 682 | Disposition: A | Discharge status: Skilled Nursing Facility | Payer: Medicare PPO | Attending: Family Medicine | Admitting: Family Medicine

## 2020-12-11 DIAGNOSIS — Z7984 Long term (current) use of oral hypoglycemic drugs: Secondary | ICD-10-CM

## 2020-12-11 DIAGNOSIS — R627 Adult failure to thrive: Secondary | ICD-10-CM | POA: Diagnosis present

## 2020-12-11 DIAGNOSIS — E7800 Pure hypercholesterolemia, unspecified: Secondary | ICD-10-CM | POA: Diagnosis present

## 2020-12-11 DIAGNOSIS — Z20822 Contact with and (suspected) exposure to covid-19: Secondary | ICD-10-CM | POA: Diagnosis present

## 2020-12-11 DIAGNOSIS — I2510 Atherosclerotic heart disease of native coronary artery without angina pectoris: Secondary | ICD-10-CM | POA: Diagnosis present

## 2020-12-11 DIAGNOSIS — Z79899 Other long term (current) drug therapy: Secondary | ICD-10-CM

## 2020-12-11 DIAGNOSIS — I5032 Chronic diastolic (congestive) heart failure: Secondary | ICD-10-CM | POA: Diagnosis present

## 2020-12-11 DIAGNOSIS — E876 Hypokalemia: Secondary | ICD-10-CM | POA: Diagnosis present

## 2020-12-11 DIAGNOSIS — I130 Hypertensive heart and chronic kidney disease with heart failure and stage 1 through stage 4 chronic kidney disease, or unspecified chronic kidney disease: Secondary | ICD-10-CM | POA: Diagnosis present

## 2020-12-11 DIAGNOSIS — R4189 Other symptoms and signs involving cognitive functions and awareness: Secondary | ICD-10-CM | POA: Diagnosis present

## 2020-12-11 DIAGNOSIS — N183 Chronic kidney disease, stage 3 unspecified: Secondary | ICD-10-CM | POA: Diagnosis present

## 2020-12-11 DIAGNOSIS — E1159 Type 2 diabetes mellitus with other circulatory complications: Secondary | ICD-10-CM | POA: Diagnosis present

## 2020-12-11 DIAGNOSIS — Z905 Acquired absence of kidney: Secondary | ICD-10-CM | POA: Diagnosis not present

## 2020-12-11 DIAGNOSIS — Z515 Encounter for palliative care: Secondary | ICD-10-CM | POA: Diagnosis not present

## 2020-12-11 DIAGNOSIS — F32A Depression, unspecified: Secondary | ICD-10-CM | POA: Diagnosis present

## 2020-12-11 DIAGNOSIS — Z833 Family history of diabetes mellitus: Secondary | ICD-10-CM

## 2020-12-11 DIAGNOSIS — I69991 Dysphagia following unspecified cerebrovascular disease: Secondary | ICD-10-CM

## 2020-12-11 DIAGNOSIS — Z6824 Body mass index (BMI) 24.0-24.9, adult: Secondary | ICD-10-CM

## 2020-12-11 DIAGNOSIS — N179 Acute kidney failure, unspecified: Principal | ICD-10-CM | POA: Diagnosis present

## 2020-12-11 DIAGNOSIS — E860 Dehydration: Secondary | ICD-10-CM | POA: Diagnosis present

## 2020-12-11 DIAGNOSIS — N400 Enlarged prostate without lower urinary tract symptoms: Secondary | ICD-10-CM | POA: Diagnosis present

## 2020-12-11 DIAGNOSIS — I152 Hypertension secondary to endocrine disorders: Secondary | ICD-10-CM | POA: Diagnosis present

## 2020-12-11 DIAGNOSIS — Z881 Allergy status to other antibiotic agents status: Secondary | ICD-10-CM | POA: Diagnosis not present

## 2020-12-11 DIAGNOSIS — F419 Anxiety disorder, unspecified: Secondary | ICD-10-CM | POA: Diagnosis present

## 2020-12-11 DIAGNOSIS — I69319 Unspecified symptoms and signs involving cognitive functions following cerebral infarction: Secondary | ICD-10-CM

## 2020-12-11 DIAGNOSIS — Z7189 Other specified counseling: Secondary | ICD-10-CM | POA: Diagnosis not present

## 2020-12-11 DIAGNOSIS — N189 Chronic kidney disease, unspecified: Secondary | ICD-10-CM | POA: Diagnosis not present

## 2020-12-11 DIAGNOSIS — Z7902 Long term (current) use of antithrombotics/antiplatelets: Secondary | ICD-10-CM

## 2020-12-11 DIAGNOSIS — E1169 Type 2 diabetes mellitus with other specified complication: Secondary | ICD-10-CM | POA: Diagnosis present

## 2020-12-11 DIAGNOSIS — E43 Unspecified severe protein-calorie malnutrition: Secondary | ICD-10-CM | POA: Diagnosis present

## 2020-12-11 DIAGNOSIS — F17210 Nicotine dependence, cigarettes, uncomplicated: Secondary | ICD-10-CM | POA: Diagnosis present

## 2020-12-11 DIAGNOSIS — N1831 Chronic kidney disease, stage 3a: Secondary | ICD-10-CM | POA: Diagnosis present

## 2020-12-11 DIAGNOSIS — F0390 Unspecified dementia without behavioral disturbance: Secondary | ICD-10-CM | POA: Diagnosis present

## 2020-12-11 DIAGNOSIS — E1122 Type 2 diabetes mellitus with diabetic chronic kidney disease: Secondary | ICD-10-CM | POA: Diagnosis present

## 2020-12-11 DIAGNOSIS — Z8249 Family history of ischemic heart disease and other diseases of the circulatory system: Secondary | ICD-10-CM

## 2020-12-11 DIAGNOSIS — F0150 Vascular dementia without behavioral disturbance: Secondary | ICD-10-CM | POA: Diagnosis not present

## 2020-12-11 DIAGNOSIS — Z66 Do not resuscitate: Secondary | ICD-10-CM | POA: Diagnosis present

## 2020-12-11 DIAGNOSIS — E785 Hyperlipidemia, unspecified: Secondary | ICD-10-CM | POA: Diagnosis present

## 2020-12-11 DIAGNOSIS — R1310 Dysphagia, unspecified: Secondary | ICD-10-CM | POA: Diagnosis present

## 2020-12-11 DIAGNOSIS — N184 Chronic kidney disease, stage 4 (severe): Secondary | ICD-10-CM | POA: Diagnosis present

## 2020-12-11 LAB — URINALYSIS, COMPLETE (UACMP) WITH MICROSCOPIC
Bilirubin Urine: NEGATIVE
Glucose, UA: >=500 mg/dL — AB
Hgb urine dipstick: NEGATIVE
Ketones, ur: NEGATIVE mg/dL
Leukocytes,Ua: NEGATIVE
Nitrite: NEGATIVE
Protein, ur: >=300 mg/dL — AB
Specific Gravity, Urine: 1.023 (ref 1.005–1.030)
pH: 5 (ref 5.0–8.0)

## 2020-12-11 LAB — COMPREHENSIVE METABOLIC PANEL
ALT: 17 U/L (ref 0–44)
AST: 27 U/L (ref 15–41)
Albumin: 2.9 g/dL — ABNORMAL LOW (ref 3.5–5.0)
Alkaline Phosphatase: 43 U/L (ref 38–126)
Anion gap: 10 (ref 5–15)
BUN: 21 mg/dL (ref 8–23)
CO2: 23 mmol/L (ref 22–32)
Calcium: 8.8 mg/dL — ABNORMAL LOW (ref 8.9–10.3)
Chloride: 110 mmol/L (ref 98–111)
Creatinine, Ser: 1.92 mg/dL — ABNORMAL HIGH (ref 0.61–1.24)
GFR, Estimated: 37 mL/min — ABNORMAL LOW (ref >60)
Glucose, Bld: 181 mg/dL — ABNORMAL HIGH (ref 70–99)
Potassium: 3.8 mmol/L (ref 3.5–5.1)
Sodium: 143 mmol/L (ref 135–145)
Total Bilirubin: 1.4 mg/dL — ABNORMAL HIGH (ref 0.3–1.2)
Total Protein: 6 g/dL — ABNORMAL LOW (ref 6.5–8.1)

## 2020-12-11 LAB — RESP PANEL BY RT-PCR (FLU A&B, COVID) ARPGX2
Influenza A by PCR: NEGATIVE
Influenza B by PCR: NEGATIVE
SARS Coronavirus 2 by RT PCR: NEGATIVE

## 2020-12-11 LAB — CBC
HCT: 36.2 % — ABNORMAL LOW (ref 39.0–52.0)
Hemoglobin: 11.8 g/dL — ABNORMAL LOW (ref 13.0–17.0)
MCH: 32.8 pg (ref 26.0–34.0)
MCHC: 32.6 g/dL (ref 30.0–36.0)
MCV: 100.6 fL — ABNORMAL HIGH (ref 80.0–100.0)
Platelets: 242 10*3/uL (ref 150–400)
RBC: 3.6 MIL/uL — ABNORMAL LOW (ref 4.22–5.81)
RDW: 12 % (ref 11.5–15.5)
WBC: 9.4 10*3/uL (ref 4.0–10.5)
nRBC: 0 % (ref 0.0–0.2)

## 2020-12-11 LAB — PROTIME-INR
INR: 1.1 (ref 0.8–1.2)
Prothrombin Time: 14.6 seconds (ref 11.4–15.2)

## 2020-12-11 LAB — DIFFERENTIAL
Abs Immature Granulocytes: 0.03 10*3/uL (ref 0.00–0.07)
Basophils Absolute: 0 10*3/uL (ref 0.0–0.1)
Basophils Relative: 0 %
Eosinophils Absolute: 0 10*3/uL (ref 0.0–0.5)
Eosinophils Relative: 0 %
Immature Granulocytes: 0 %
Lymphocytes Relative: 22 %
Lymphs Abs: 2.1 10*3/uL (ref 0.7–4.0)
Monocytes Absolute: 1 10*3/uL (ref 0.1–1.0)
Monocytes Relative: 11 %
Neutro Abs: 6.3 10*3/uL (ref 1.7–7.7)
Neutrophils Relative %: 67 %

## 2020-12-11 LAB — APTT: aPTT: 26 seconds (ref 24–36)

## 2020-12-11 MED ORDER — STERILE WATER FOR INJECTION IV SOLN
INTRAVENOUS | Status: DC
  Filled 2020-12-11: qty 1000
  Filled 2020-12-11: qty 150
  Filled 2020-12-11 (×3): qty 1000
  Filled 2020-12-11: qty 150
  Filled 2020-12-11 (×3): qty 1000
  Filled 2020-12-11: qty 150
  Filled 2020-12-11 (×2): qty 1000

## 2020-12-11 MED ORDER — ACETAMINOPHEN 325 MG PO TABS: 650.0000 mg | ORAL_TABLET | Freq: Four times a day (QID) | ORAL | Status: DC | PRN

## 2020-12-11 MED ORDER — HEPARIN SODIUM (PORCINE) 5000 UNIT/ML IJ SOLN
5000.0000 [IU] | Freq: Three times a day (TID) | INTRAMUSCULAR | Status: DC
  Administered 2020-12-12 – 2020-12-15 (×9): 5000 [IU] via SUBCUTANEOUS
  Filled 2020-12-11 (×9): qty 1

## 2020-12-11 MED ORDER — CALCIUM CARBONATE ANTACID 1250 MG/5ML PO SUSP
500.0000 mg | Freq: Four times a day (QID) | ORAL | Status: DC | PRN
  Filled 2020-12-11: qty 5

## 2020-12-11 MED ORDER — ACETAMINOPHEN 650 MG RE SUPP: 650.0000 mg | Freq: Four times a day (QID) | RECTAL | Status: DC | PRN

## 2020-12-11 MED ORDER — INSULIN ASPART 100 UNIT/ML IJ SOLN
0.0000 [IU] | Freq: Every day | INTRAMUSCULAR | Status: DC
  Administered 2020-12-15: 22:00:00 2 [IU] via SUBCUTANEOUS
  Filled 2020-12-11: qty 1

## 2020-12-11 MED ORDER — INSULIN ASPART 100 UNIT/ML IJ SOLN
0.0000 [IU] | Freq: Three times a day (TID) | INTRAMUSCULAR | Status: DC
  Administered 2020-12-12 (×2): 3 [IU] via SUBCUTANEOUS
  Administered 2020-12-12 – 2020-12-13 (×3): 2 [IU] via SUBCUTANEOUS
  Administered 2020-12-13: 09:00:00 1 [IU] via SUBCUTANEOUS
  Administered 2020-12-14: 12:00:00 3 [IU] via SUBCUTANEOUS
  Administered 2020-12-15: 2 [IU] via SUBCUTANEOUS
  Administered 2020-12-15: 12:00:00 5 [IU] via SUBCUTANEOUS
  Administered 2020-12-16: 08:00:00 2 [IU] via SUBCUTANEOUS
  Administered 2020-12-16: 12:00:00 1 [IU] via SUBCUTANEOUS
  Filled 2020-12-11 (×11): qty 1

## 2020-12-11 MED ORDER — ACETAMINOPHEN 500 MG PO TABS
1000.0000 mg | ORAL_TABLET | Freq: Once | ORAL | Status: AC
  Administered 2020-12-11: 1000 mg via ORAL
  Filled 2020-12-11: qty 2

## 2020-12-11 MED ORDER — ZOLPIDEM TARTRATE 5 MG PO TABS
5.0000 mg | ORAL_TABLET | Freq: Every evening | ORAL | Status: DC | PRN
  Administered 2020-12-14: 22:00:00 5 mg via ORAL
  Filled 2020-12-11: qty 1

## 2020-12-11 MED ORDER — CAMPHOR-MENTHOL 0.5-0.5 % EX LOTN
1.0000 "application " | TOPICAL_LOTION | Freq: Three times a day (TID) | CUTANEOUS | Status: DC | PRN
  Filled 2020-12-11: qty 222

## 2020-12-11 MED ORDER — SODIUM CHLORIDE 0.9% FLUSH
3.0000 mL | Freq: Once | INTRAVENOUS | Status: AC
Start: 2020-12-11 — End: 2020-12-11
  Administered 2020-12-11: 3 mL via INTRAVENOUS

## 2020-12-11 MED ORDER — LACTATED RINGERS IV BOLUS
1000.0000 mL | Freq: Once | INTRAVENOUS | Status: AC
  Administered 2020-12-11: 1000 mL via INTRAVENOUS

## 2020-12-11 MED ORDER — NEPRO/CARBSTEADY PO LIQD: 237.0000 mL | Freq: Three times a day (TID) | ORAL | Status: DC | PRN

## 2020-12-11 MED ORDER — DOCUSATE SODIUM 283 MG RE ENEM
1.0000 | ENEMA | RECTAL | Status: DC | PRN
  Filled 2020-12-11: qty 1

## 2020-12-11 MED ORDER — HYDROXYZINE HCL 25 MG PO TABS
25.0000 mg | ORAL_TABLET | Freq: Three times a day (TID) | ORAL | Status: DC | PRN
  Filled 2020-12-11: qty 1

## 2020-12-11 MED ORDER — ONDANSETRON HCL 4 MG PO TABS: 4.0000 mg | ORAL_TABLET | Freq: Four times a day (QID) | ORAL | Status: DC | PRN

## 2020-12-11 MED ORDER — SORBITOL 70 % SOLN
30.0000 mL | Status: DC | PRN
  Filled 2020-12-11: qty 30

## 2020-12-11 MED ORDER — ONDANSETRON HCL 4 MG/2ML IJ SOLN: 4.0000 mg | Freq: Four times a day (QID) | INTRAMUSCULAR | Status: DC | PRN

## 2020-12-11 NOTE — H&P (Signed)
History and Physical   General Steven [id number] DOB: 02/19/1951 DOA: 12/11/2020  Referring MD/NP/PA: Dr. Vladimir Steven Ingram  PCP: [facility]    Patient coming from: Home  Chief Complaint: Failure to thrive  HPI: Steven Ingram is a 70 y.o. male with medical history significant of dementia, diabetes, previous CVA, hypertension, hyperlipidemia, moderate protein calorie malnutrition, chronic kidney disease stage III, coronary artery disease, chronic diastolic CHF among other things who was brought in by family after sustaining a fall recently.  Patient apparently has been working and doing fine prior to the fall at home.  Since then he has progressively declined.  He has had poor oral intake.  Also noted to be progressively weak.  He is generally failing to thrive.  Patient seen in the ER looks chronically ill.  His baseline creatinine is 1.3 currently at 1.95.  Also has evidence of dehydration.  At this point he is being admitted with acute kidney injury on chronic kidney disease stage of 3 with failure to thrive at home..  ED Course: Temperature 99.4, blood pressure 160/78, pulse 114 respiratory of 19 oxygen sat 94% room air.  Sodium 143 potassium 3.8, chloride 110 CO2 23, glucose 181, BUN 21 creatinine 1.92, calcium 8.8 with a gap of 10.  Albumin is 2.9.  Total bilirubin 1.4.  White count 9.4 hemoglobin 11.8 and platelets 242.  Head CT without contrast is negative.  Chest x-ray showed no acute findings.  Patient being admitted with AKI on CKD 3 and failure to thrive probably due to poor oral intake.  Review of Systems: As per HPI otherwise 10 point review of systems negative.    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   High cholesterol    History of CVA (cerebrovascular accident) 07/19/2019   HLD (hyperlipidemia) 03/31/2020   HTN (hypertension) 07/19/2019   Hypertension    Hypokalemia    Left pontine stroke (Steven Ingram) 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 He has a 53.00 pack-year smoking history. He has never used smokeless tobacco. He reports current alcohol use. He reports that he does not use drugs.  Allergies  Allergen Reactions   Ciprofloxacin Other (See Comments)    Possibly contributed to seizure-like activity resulting in 06/15/20 admission     Family History  Problem Relation Age of Onset   Diabetes Mellitus II Mother    Hypertension Mother    Diabetes Mellitus II Father    Hypertension Father    Prostate cancer Neg Hx    Bladder Cancer Neg Hx    Kidney cancer Neg Hx      Prior to Admission medications   Medication Sig Start Date End Date  Taking? Authorizing Provider  atorvastatin (LIPITOR) 80 MG tablet Take 80 mg by mouth daily.    [provider]  clopidogrel (PLAVIX) 75 MG tablet Take 1 tablet (75 mg total) by mouth daily. 07/21/19   Jennye Boroughs, MD  empagliflozin (JARDIANCE) 10 MG TABS tablet Take 10 mg by mouth daily.    [provider]  lisinopril (ZESTRIL) 40 MG tablet Take 40 mg by mouth daily. 07/01/20   [provider]  metoprolol tartrate  (LOPRESSOR) 50 MG tablet Take 1 tablet (50 mg total) by mouth daily. Patient not taking: No sig reported 06/21/20 06/21/21  Sharen Hones, MD  risperiDONE (RISPERDAL) 0.25 MG tablet Take 0.25 mg by mouth daily. 05/25/20   [provider]  sertraline (ZOLOFT) 25 MG tablet Take 25 mg by mouth daily.    [provider]  tamsulosin (FLOMAX) 0.4 MG CAPS capsule Take 0.4 mg by mouth 2 (two) times daily.    [provider]  vitamin B-12 1000 MCG tablet Take 1 tablet (1,000 mcg total) by mouth daily. Patient not taking: No sig reported 06/22/20   Sharen Hones, MD  Vitamin D, Ergocalciferol, (DRISDOL) 1.25 MG (50000 UNIT) CAPS capsule Take 50,000 Units by mouth every 30 (thirty) days. Patient not taking: Reported on 11/30/2020    [provider]    Physical Exam: Vitals:   12/11/20 1516 12/11/20 2043 12/11/20 2230 12/11/20 2300  BP:  126/76 140/73 (!) 160/78  Pulse:  100 83 82  Resp:  '18 17 19  '$ Temp:      TempSrc:      SpO2:  94% 97% 100%  Weight: 87 kg     Height: '5\' 9"'$  (1.753 m)         Constitutional: Chronically ill looking, confused Vitals:   12/11/20 1516 12/11/20 2043 12/11/20 2230 12/11/20 2300  BP:  126/76 140/73 (!) 160/78  Pulse:  100 83 82  Resp:  '18 17 19  '$ Temp:      TempSrc:      SpO2:  94% 97% 100%  Weight: 87 kg     Height: '5\' 9"'$  (1.753 m)      Eyes: PERRL, lids and conjunctivae normal ENMT: Mucous membranes are dry. Posterior pharynx clear of any exudate or lesions.Normal dentition.  Neck: normal, supple, no masses, no thyromegaly Respiratory: clear to auscultation bilaterally, no wheezing, no crackles. Normal respiratory effort. No accessory muscle use.  Cardiovascular: Sinus tachycardia no murmurs / rubs / gallops. No extremity edema. 2+ pedal pulses. No carotid bruits.  Abdomen: no tenderness, no masses palpated. No hepatosplenomegaly. Bowel sounds positive.  Musculoskeletal: no clubbing / cyanosis. No joint deformity upper and lower  extremities. Good ROM, no contractures. Normal muscle tone.  Skin: no rashes, lesions, ulcers. No induration Neurologic: CN 2-12 grossly intact. Sensation intact, DTR normal. Strength 5/5 in all 4.  No focal weakness Psychiatric: Lethargic, confused,.     Labs on Admission: I have personally reviewed following labs and imaging studies  CBC: Recent Labs  Lab 12/11/20 1528  WBC 9.4  NEUTROABS 6.3  HGB 11.8*  HCT 36.2*  MCV 100.6*  PLT [id number]   Basic Metabolic Panel: Recent Labs  Lab 12/11/20 1528  NA 143  K 3.8  CL 110  CO2 23  GLUCOSE 181*  BUN 21  CREATININE 1.92*  CALCIUM 8.8*   GFR: Estimated Creatinine Clearance: 38.5 mL/min (A) (by C-G formula based on SCr of 1.92 mg/dL (H)). Liver Function Tests: Recent Labs  Lab 12/11/20 1528  AST 27  ALT 17  ALKPHOS 43  BILITOT 1.4*  PROT 6.0*  ALBUMIN 2.9*   No results for input(s): LIPASE, AMYLASE in the last 168 hours. No results for input(s): AMMONIA in the last 168 hours. Coagulation Profile: Recent Labs  Lab 12/11/20 1528  INR 1.1   Cardiac Enzymes: No results for input(s): CKTOTAL, CKMB, CKMBINDEX, TROPONINI in the last 168 hours. BNP (last 3 results) No results for input(s): PROBNP in the last 8760 hours. HbA1C: No results for input(s): HGBA1C in the last 72 hours. CBG: No results for input(s): GLUCAP in the last 168 hours. Lipid Profile: No results for input(s): CHOL, HDL, LDLCALC, TRIG, CHOLHDL, LDLDIRECT in the last 72 hours. Thyroid Function Tests: No results for input(s): TSH, T4TOTAL, FREET4, T3FREE, THYROIDAB in the last 72 hours. Anemia Panel: No results for input(s): VITAMINB12, FOLATE, FERRITIN, TIBC, IRON, RETICCTPCT in the last 72 hours. Urine analysis:    Component Value Date/Time   COLORURINE AMBER (A) 12/11/2020 2217   APPEARANCEUR CLOUDY (A) 12/11/2020 2217   LABSPEC 1.023 12/11/2020 2217   PHURINE 5.0 12/11/2020 2217   GLUCOSEU >=500 (A) 12/11/2020 2217   HGBUR NEGATIVE  12/11/2020 2217   [place] NEGATIVE 12/11/2020 2217   KETONESUR NEGATIVE 12/11/2020 2217   PROTEINUR >=300 (A) 12/11/2020 2217   NITRITE NEGATIVE 12/11/2020 2217   LEUKOCYTESUR NEGATIVE 12/11/2020 2217   Sepsis Labs: '@LABRCNTIP'$ (procalcitonin:4,lacticidven:4) ) Recent Results (from the past 240 hour(s))  Resp Panel by RT-PCR (Flu A&B, Covid) Nasopharyngeal Swab     Status: None   Collection Time: 12/11/20 10:17 PM   Specimen: Nasopharyngeal Swab; Nasopharyngeal(NP) swabs in vial transport medium  Result Value Ref Range Status   SARS Coronavirus 2 by RT PCR NEGATIVE NEGATIVE Final    Comment: (NOTE) SARS-CoV-2 target nucleic acids are NOT DETECTED.  The SARS-CoV-2 RNA is generally detectable in upper respiratory specimens during the acute phase of infection. The lowest concentration of SARS-CoV-2 viral copies this assay can detect is 138 copies/mL. A negative result does not preclude SARS-Cov-2 infection and should not be used as the sole basis for treatment or other patient management decisions. A negative result may occur with  improper specimen collection/handling, submission of specimen other than nasopharyngeal swab, presence of viral mutation(s) within the areas targeted by this assay, and inadequate number of viral copies(<138 copies/mL). A negative result must be combined with clinical observations, patient history, and epidemiological information. The expected result is Negative.  Fact Sheet for Patients:  EntrepreneurPulse.com.au  Fact Sheet for Healthcare Providers:  IncredibleEmployment.be  This test is no t yet approved or cleared by the [place] FDA and  has been authorized for detection and/or diagnosis of SARS-CoV-2 by FDA under an Emergency Use Authorization (EUA). This EUA will remain  in effect (meaning this test can be used) for the duration of the COVID-19 declaration under Section 564(b)(1) of the Act,  21 U.S.C.section 360bbb-3(b)(1), unless the authorization is terminated  or revoked sooner.       Influenza A by PCR NEGATIVE NEGATIVE Final   Influenza B by PCR NEGATIVE NEGATIVE Final    Comment: (NOTE) The Xpert Xpress SARS-CoV-2/FLU/RSV plus assay is intended as an aid in the diagnosis of influenza from Nasopharyngeal swab specimens and should not be used as a sole basis for treatment. Nasal washings and aspirates are unacceptable for Xpert Xpress SARS-CoV-2/FLU/RSV testing.  Fact Sheet for Patients: EntrepreneurPulse.com.au  Fact Sheet for Healthcare Providers: IncredibleEmployment.be  This test is not yet approved or cleared by the  [place] Architectural technologist and has been authorized for detection and/or diagnosis of SARS-CoV-2 by FDA under an Print production planner (EUA). This EUA will remain in effect (meaning this test can be used) for the duration of the COVID-19 declaration under Section 564(b)(1) of the Act, 21 U.S.C. section 360bbb-3(b)(1), unless the authorization is terminated or revoked.  Performed at [facility], [address], [place], [place] [address]      Radiological Exams on Admission: CT HEAD WO CONTRAST  Result Date: 12/11/2020 CLINICAL DATA:  Altered mental status. EXAM: CT HEAD WITHOUT CONTRAST TECHNIQUE: Contiguous axial images were obtained from the base of the skull through the vertex without intravenous contrast. COMPARISON:  June 15, 2020. FINDINGS: Brain: Mild diffuse cortical atrophy is noted. Mild chronic ischemic white matter disease is noted. No mass effect or midline shift is noted. Ventricular size is within normal limits. There is no evidence of mass lesion, hemorrhage or acute infarction. Vascular: No hyperdense vessel or unexpected calcification. Skull: Normal. Negative for fracture or focal lesion. Sinuses/Orbits: No acute finding. Other: Small left posterior scalp hematoma is noted. IMPRESSION:  Small left posterior scalp hematoma. No acute intracranial abnormality seen. Electronically Signed   By: Marijo Conception M.D.   On: 12/11/2020 15:57   DG Chest Portable 1 View  Result Date: 12/11/2020 CLINICAL DATA:  Cough EXAM: PORTABLE CHEST 1 VIEW COMPARISON:  03/31/2020 FINDINGS: The heart size and mediastinal contours are within normal limits. Both lungs are clear. The visualized skeletal structures are unremarkable. IMPRESSION: No active disease. Electronically Signed   By: Ulyses Jarred M.D.   On: 12/11/2020 22:03      Assessment/Plan Principal Problem:   Acute kidney injury superimposed on CKD [facility]) Active Problems:   Type 2 diabetes mellitus with stage 3 chronic kidney disease (HCC)   CAD (coronary artery disease)   Hypertension associated with diabetes ([place])   HLD (hyperlipidemia)   Chronic diastolic CHF (congestive heart failure) (HCC)   Dementia without behavioral disturbance ([place])   Hyperlipidemia associated with type 2 diabetes mellitus (HCC)   Cognitive deficit, post-stroke   FTT (failure to thrive) in adult   AKI (acute kidney injury) ([place])     #1 acute kidney injury on CKD 3: Most likely as a result of poor oral intake and dehydration.  No reported history of nausea vomiting or diarrhea.  At this point admit the patient for aggressive hydration.  Monitor renal function.  Renal ultrasound if no significant improvement.  #2 recent fall: PT and OT consultation.  Patient may require skilled placement.  #3 type 2 diabetes: On oral medications at home.  We will hold that.  Initiate sliding scale insulin.  #4 hyperlipidemia: Patient on atorvastatin.  Confirm dose and resume home regimen.  #5 failure to thrive in adult: PT OT and nutritional consultation.  Possible placement.  #6 essential hypertension: Patient has been on lisinopril.  We will hold it due to renal function decline.  Monitor blood pressure.  On metoprolol which we may continue with.  #7 chronic diastolic  heart failure: Appears compensated at the moment.  Continue monitoring  #8 coronary artery disease: On Plavix, statin,.  We will continue once confirmed   DVT prophylaxis: Heparin Code Status: Full code Family Communication: Wife Disposition Plan: Home Consults called: None Admission status: Inpatient  Severity of Illness: The appropriate patient status for this patient is INPATIENT. Inpatient status is judged to be reasonable and necessary in order to provide the required intensity of service to  ensure the patient's safety. The patient's presenting symptoms, physical exam findings, and initial radiographic and laboratory data in the context of their chronic comorbidities is felt to place them at high risk for further clinical deterioration. Furthermore, it is not anticipated that the patient will be medically stable for discharge from the hospital within 2 midnights of admission. The following factors support the patient status of inpatient.   " The patient's presenting symptoms include weakness and failure to thrive. " The worrisome physical exam findings include dehydration and confusion. " The initial radiographic and laboratory data are worrisome because of worsening renal function. " The chronic co-morbidities include chronic kidney disease stage III and diabetes.   * I certify that at the point of admission it is my clinical judgment that the patient will require inpatient hospital care spanning beyond 2 midnights from the point of admission due to high intensity of service, high risk for further deterioration and high frequency of surveillance required.Barbette Merino MD [facility] Pager [phone]  If 7PM-7AM, please contact night-coverage [email or web address] Password [facility]  12/11/2020, 11:34 PM

## 2020-12-11 NOTE — ED Provider Notes (Signed)
[facility] Emergency Department Provider Note ____________________________________________   Event Date/Time   First MD Initiated Contact with Patient 12/11/20 2054     (approximate)  I have reviewed the triage vital signs and the nursing notes.  HISTORY  Chief Complaint Aphasia and trouble walking   HPI Williom Ingram is a 70 y.o. malewho presents to the ED for evaluation of weakness.   Chart review indicates history of dementia, CVA and CKD 3.  He is s/p right nephrectomy.  CAD and diastolic CHF.  HTN, HLD and DM.  Patient presents to the ED from home, accompanied by his wife, for evaluation of increasing weakness since a fall that occurred this past Wednesday, 4 days ago.  Wife provides majority of history as patient is quite weak with a soft voice.   She reports that she heard him fall coming out of the bathroom this past Wednesday, and she had to call for help to get him up.  No known syncope or seizure-like activity.  Since that fall, he has hardly walked at all and has been "laid up" since then.  They report an increased nonproductive cough with minimal production.  They report generalized weakness, poor p.o. intake and concerns for dehydration.  They report foul-smelling urine and decreased urinary output without dysuria or hematuria. They further reports bilateral leg aches and pain without trauma.  He denies any injuries from the fall, but his bilateral legs have been aching and painful despite Tylenol.  Wife reports trying to get him up out of the bed today, and he could not even get up, so due to this they present to the ED for evaluation.  Patient Active Problem List   Diagnosis Date Noted   Dysphagia as late effect of stroke 09/28/2020   Seizure-like activity ([place]) 06/15/2020   Hyperlipidemia associated with type 2 diabetes mellitus ([place]) 06/15/2020   Dementia without behavioral disturbance ([place])    Hypokalemia    Encephalopathy acute  123456   Acute metabolic encephalopathy 123456   SIRS (systemic inflammatory response syndrome) ([place]) 03/31/2020   Acute kidney injury superimposed on CKD ([place]) 03/31/2020   HLD (hyperlipidemia) 03/31/2020   Chronic diastolic CHF (congestive heart failure) ([place]) 03/31/2020   Malnutrition of moderate degree 07/20/2019   Dizziness 07/19/2019   Unsteady gait when walking 07/19/2019   History of CVA (cerebrovascular accident) 07/19/2019   Type 2 diabetes mellitus with stage 3 chronic kidney disease ([place]) 07/19/2019   CAD (coronary artery disease) 07/19/2019   Hypertension associated with diabetes ([place]) 07/19/2019   H/O right nephrectomy 07/19/2019   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 Authorizing Provider  atorvastatin (LIPITOR) 80 MG tablet Take 80 mg by mouth daily.    [provider]  clopidogrel (PLAVIX) 75 MG tablet Take 1 tablet (75 mg total) by mouth daily. 07/21/19   Jennye Boroughs, MD  empagliflozin (JARDIANCE) 10 MG TABS tablet Take 10 mg by mouth daily.    [provider]  lisinopril (ZESTRIL) 40 MG tablet Take 40 mg by mouth daily. 07/01/20   [provider]  metoprolol tartrate (LOPRESSOR) 50  MG tablet Take 1 tablet (50 mg total) by mouth daily. Patient not taking: No sig reported 06/21/20 06/21/21  Sharen Hones, MD  risperiDONE (RISPERDAL) 0.25 MG tablet Take 0.25 mg by mouth daily. 05/25/20   [provider]  sertraline (ZOLOFT) 25 MG tablet Take 25 mg by mouth daily.    [provider]  tamsulosin (FLOMAX) 0.4 MG  CAPS capsule Take 0.4 mg by mouth 2 (two) times daily.    [provider]  vitamin B-12 1000 MCG tablet Take 1 tablet (1,000 mcg total) by mouth daily. Patient not taking: No sig reported 06/22/20   Sharen Hones, MD  Vitamin D, Ergocalciferol, (DRISDOL) 1.25 MG (50000 UNIT) CAPS capsule Take 50,000 Units by mouth every 30 (thirty) days. Patient not taking: Reported on 11/30/2020    [provider]    Allergies Ciprofloxacin  Family History  Problem Relation Age of Onset   Diabetes Mellitus II Mother    Hypertension Mother    Diabetes Mellitus II Father    Hypertension Father    Prostate cancer Neg Hx    Bladder Cancer Neg Hx    Kidney cancer Neg Hx     Social History Social History   Tobacco Use   Smoking status: Every Day    Packs/day: 1.00    Years: 53.00    Pack years: 53.00    Types: Cigarettes   Smokeless tobacco: Never  Vaping Use   Vaping Use: Never used  Substance Use Topics   Alcohol use: Yes    Comment: last drink yesterday   Drug use: Never    Review of Systems  Constitutional: No fever/chills.  Positive for generalized weakness and malaise.  Poor p.o. intake. Eyes: No visual changes. ENT: No sore throat. Cardiovascular: Denies chest pain. Respiratory: Denies shortness of breath.  Positive for nonproductive cough. Gastrointestinal: No abdominal pain.  No nausea, no vomiting.  No diarrhea.  No constipation.  Positive for poor p.o. intake. Genitourinary: Negative for dysuria.  Positive for foul-smelling urine and decreased volume Musculoskeletal: Negative for back pain. Skin: Negative for rash. Neurological: Negative for headaches, focal weakness or numbness. ____________________________________________   PHYSICAL EXAM:  VITAL SIGNS: Vitals:   12/11/20 2230 12/11/20 2300  BP: 140/73 (!) 160/78  Pulse: 83 82  Resp: 17 19  Temp:    SpO2: 97% 100%    Constitutional: Alert and oriented.  Conversational with a soft and raspy  voice. Eyes: Conjunctivae are normal. PERRL. EOMI. Head: Atraumatic. Nose: No congestion/rhinnorhea. Mouth/Throat: Mucous membranes are moist.  Oropharynx non-erythematous. Neck: No stridor. No cervical spine tenderness to palpation. Cardiovascular: Normal rate, regular rhythm. Grossly normal heart sounds.  Good peripheral circulation. Respiratory: Normal respiratory effort.  No retractions. Lungs CTAB. Gastrointestinal: Soft , nondistended, nontender to palpation. No CVA tenderness. Musculoskeletal: No lower extremity tenderness nor edema.  No joint effusions. No signs of acute trauma. Neurologic:  Normal speech and language. No gross focal neurologic deficits are appreciated. No gait instability noted. Skin:  Skin is warm, dry and intact. No rash noted. Psychiatric: Mood and affect are normal. Speech and behavior are normal.  ____________________________________________   LABS (all labs ordered are listed, but only abnormal results are displayed)  Labs Reviewed  CBC - Abnormal; Notable for the following components:      Result Value   RBC 3.60 (*)    Hemoglobin 11.8 (*)    HCT 36.2 (*)    MCV 100.6 (*)    All other components within normal limits  COMPREHENSIVE METABOLIC PANEL - Abnormal; Notable for the following components:   Glucose, Bld 181 (*)    Creatinine, Ser 1.92 (*)    Calcium 8.8 (*)    Total Protein 6.0 (*)    Albumin 2.9 (*)    Total Bilirubin 1.4 (*)    GFR, Estimated 37 (*)    All other components within normal limits  URINALYSIS, COMPLETE (UACMP) WITH MICROSCOPIC - Abnormal; Notable for the following components:   Color, Urine AMBER (*)    APPearance CLOUDY (*)    Glucose, UA >=500 (*)    Protein, ur >=300 (*)    Bacteria, UA RARE (*)    All other components within normal limits  RESP PANEL BY RT-PCR (FLU A&B, COVID) [id number]  PROTIME-INR  APTT  DIFFERENTIAL  CBG MONITORING, ED   ____________________________________________  12 Lead  EKG   ____________________________________________  RADIOLOGY  ED MD interpretation: CT head reviewed by me without evidence of acute intracranial pathology. CXR reviewed by me without evidence of acute cardiopulmonary pathology.  Official radiology report(s): CT HEAD WO CONTRAST  Result Date: 12/11/2020 CLINICAL DATA:  Altered mental status. EXAM: CT HEAD WITHOUT CONTRAST TECHNIQUE: Contiguous axial images were obtained from the base of the skull through the vertex without intravenous contrast. COMPARISON:  June 15, 2020. FINDINGS: Brain: Mild diffuse cortical atrophy is noted. Mild chronic ischemic white matter disease is noted. No mass effect or midline shift is noted. Ventricular size is within normal limits. There is no evidence of mass lesion, hemorrhage or acute infarction. Vascular: No hyperdense vessel or unexpected calcification. Skull: Normal. Negative for fracture or focal lesion. Sinuses/Orbits: No acute finding. Other: Small left posterior scalp hematoma is noted. IMPRESSION: Small left posterior scalp hematoma. No acute intracranial abnormality seen. Electronically Signed   By: Marijo Conception M.D.   On: 12/11/2020 15:57   DG Chest Portable 1 View  Result Date: 12/11/2020 CLINICAL DATA:  Cough EXAM: PORTABLE CHEST 1 VIEW COMPARISON:  03/31/2020 FINDINGS: The heart size and mediastinal contours are within normal limits. Both lungs are clear. The visualized skeletal structures are unremarkable. IMPRESSION: No active disease. Electronically Signed   By: Ulyses Jarred M.D.   On: 12/11/2020 22:03    ____________________________________________   PROCEDURES and INTERVENTIONS  Procedure(s) performed (including Critical Care):  .1-3 Lead EKG Interpretation  Date/Time: 12/11/2020 11:23 PM Performed by: Vladimir Crofts, MD Authorized by: Vladimir Crofts, MD     Interpretation: normal     ECG rate:  90   ECG rate assessment: normal     Rhythm: sinus rhythm     Ectopy: none      Conduction: normal    Medications  sodium chloride flush (NS) 0.9 % injection 3 mL (3 mLs Intravenous Given 12/11/20 2155)  lactated ringers bolus 1,000 mL (1,000 mLs Intravenous New Bag/Given 12/11/20 2155)  acetaminophen (TYLENOL) tablet 1,000 mg (1,000 mg Oral Given 12/11/20 2150)    ____________________________________________   MDM / ED COURSE   70 year old male was brought to the ED by his wife for evaluation of failure to thrive, poor p.o. intake, with evidence of an AKI requiring medical admission.  Blood work with AKI on CKD, and is otherwise unremarkable.  Urine without infectious features.  No evidence of ICH or CVA on CT head.  He has no focal neurologic deficits on examination.  Considering his inability to ambulate and care for himself at home at baseline, and has AKI, we will discuss with medicine for admission.  ____________________________________________   FINAL CLINICAL IMPRESSION(S) / ED DIAGNOSES  Final diagnoses:  AKI (acute kidney injury) ([place])  Failure to thrive in adult     ED Discharge Orders     None        Jocabed Cheese   Note:  This document was prepared using Systems analyst and may include unintentional dictation errors.    Vladimir Crofts, MD 12/11/20 [phone]

## 2020-12-11 NOTE — ED Triage Notes (Signed)
Pt comes pov with wife after fall Monday. Pt hasn't been walking since then. Starting today, pt is not talking as much or well. Denies thinners. Last time he was at his baseline was before he fell Monday. Both legs are sensitive to touch.

## 2020-12-11 NOTE — ED Notes (Signed)
Warm blankets provided for pt and wife, lights dimmed, no other needs at this time

## 2020-12-12 ENCOUNTER — Inpatient Hospital Stay: Payer: Medicare PPO

## 2020-12-12 DIAGNOSIS — F0390 Unspecified dementia without behavioral disturbance: Secondary | ICD-10-CM

## 2020-12-12 DIAGNOSIS — R627 Adult failure to thrive: Secondary | ICD-10-CM

## 2020-12-12 DIAGNOSIS — I5032 Chronic diastolic (congestive) heart failure: Secondary | ICD-10-CM

## 2020-12-12 LAB — CBC
HCT: 28.9 % — ABNORMAL LOW (ref 39.0–52.0)
Hemoglobin: 9.6 g/dL — ABNORMAL LOW (ref 13.0–17.0)
MCH: 33.1 pg (ref 26.0–34.0)
MCHC: 33.2 g/dL (ref 30.0–36.0)
MCV: 99.7 fL (ref 80.0–100.0)
Platelets: 186 10*3/uL (ref 150–400)
RBC: 2.9 MIL/uL — ABNORMAL LOW (ref 4.22–5.81)
RDW: 12 % (ref 11.5–15.5)
WBC: 7.1 10*3/uL (ref 4.0–10.5)
nRBC: 0 % (ref 0.0–0.2)

## 2020-12-12 LAB — COMPREHENSIVE METABOLIC PANEL
ALT: 17 U/L (ref 0–44)
AST: 18 U/L (ref 15–41)
Albumin: 2.5 g/dL — ABNORMAL LOW (ref 3.5–5.0)
Alkaline Phosphatase: 35 U/L — ABNORMAL LOW (ref 38–126)
Anion gap: 7 (ref 5–15)
BUN: 32 mg/dL — ABNORMAL HIGH (ref 8–23)
CO2: 30 mmol/L (ref 22–32)
Calcium: 8.2 mg/dL — ABNORMAL LOW (ref 8.9–10.3)
Chloride: 103 mmol/L (ref 98–111)
Creatinine, Ser: 2.32 mg/dL — ABNORMAL HIGH (ref 0.61–1.24)
GFR, Estimated: 29 mL/min — ABNORMAL LOW (ref >60)
Glucose, Bld: 236 mg/dL — ABNORMAL HIGH (ref 70–99)
Potassium: 3.8 mmol/L (ref 3.5–5.1)
Sodium: 140 mmol/L (ref 135–145)
Total Bilirubin: 0.9 mg/dL (ref 0.3–1.2)
Total Protein: 5.3 g/dL — ABNORMAL LOW (ref 6.5–8.1)

## 2020-12-12 LAB — GLUCOSE, CAPILLARY
Glucose-Capillary: 221 mg/dL — ABNORMAL HIGH (ref 70–99)
Glucose-Capillary: 200 mg/dL — ABNORMAL HIGH (ref 70–99)

## 2020-12-12 LAB — CBG MONITORING, ED
Glucose-Capillary: 218 mg/dL — ABNORMAL HIGH (ref 70–99)
Glucose-Capillary: 160 mg/dL — ABNORMAL HIGH (ref 70–99)

## 2020-12-12 LAB — HEMOGLOBIN A1C
Hgb A1c MFr Bld: 5.7 % — ABNORMAL HIGH (ref 4.8–5.6)
Mean Plasma Glucose: 116.89 mg/dL

## 2020-12-12 NOTE — ED Notes (Signed)
Wife leaving at this time, bed alarm on and active

## 2020-12-12 NOTE — Evaluation (Signed)
1530Physical Therapy Evaluation Patient Details Name: Steven Ingram MRN: [id number] DOB: [birth year] Today's Date: 12/12/2020   History of Present Illness  Steven Ingram is a 57yoM who comes [facility] on 8/28 s/p fall at home. WIfe provides care for patient but is on HD TTS. Pt followed by Palliative Care as an outpaient. PM: dementia, DM, CVAx2 (June 2020, April 2021), HTN, HLD, protein/calorie malnutrition, CKD3, CAD, dCHF.  Clinical Impression  Pt admitted with above diagnosis. Pt currently with functional limitations due to the deficits listed below (see "PT Problem List"). Upon entry, pt in bed, awake and sometimes interactive. Pt has flat affect, is oriented to name only, and demonstrate LT memory deficits when answering questions regarding PLOF. Pt has altered processing this date, motor apraxia >50% on time. At EOB, pt never able to establish sitting upright balance, continually leaning posteriorly, poor problem solving and following of cues to correct. Bed mobility requires totalA +2. Patient's performance this date reveals decreased ability, independence, and tolerance in performing all basic mobility required for performance of activities of daily living. Pt requires additional DME, heavy physical assistance, and cues for safe participate in mobility. Unclear if current presentation is an acute anomaly or natural progression of baseline dementia and cognitive deficits. Given long-standing difficulty with balance and AMB, trying to progress AMB capacity at this point may not be the most meaningful goal in care. Pt will benefit from skilled PT intervention to increase independence and safety with basic mobility in preparation for discharge to the venue listed below.       Follow Up Recommendations Other (comment);Supervision - Intermittent;Supervision for mobility/OOB (Palliative Care, PCP, and family have been discussing enrolement in PACE program, which I think would be a good option is  available.)    Equipment Recommendations  Wheelchair (measurements PT);Wheelchair cushion (measurements PT);Hospital bed    Recommendations for Other Services       Precautions / Restrictions Precautions Precautions: Fall Restrictions Weight Bearing Restrictions: No      Mobility  Bed Mobility Overal bed mobility: Needs Assistance Bed Mobility: Supine to Sit;Sit to Supine     Supine to sit: Max assist Sit to supine: Total assist;+2 for physical assistance        Transfers Overall transfer level:  (not attempted; pt unable to follow commands for postural control, balance; appears to have some motor apraxia.)                  Ambulation/Gait                Stairs            Wheelchair Mobility    Modified Rankin (Stroke Patients Only)       Balance Overall balance assessment:  (chronic balance difficulty; unable to establish balance sitting at EOB this date)                                           Pertinent Vitals/Pain Pain Assessment: No/denies pain    Home Living Family/patient expects to be discharged to:: Unsure Living Arrangements: Spouse/significant other Available Help at Discharge: Family;Available 24 hours/day Type of Home: House Home Access: Stairs to enter Entrance Stairs-Rails: Right;Left;Can reach both Entrance Stairs-Number of Steps: 3-4 Home Layout: Laundry or work area in basement;One level Home Equipment: Walker - 2 wheels Additional Comments: followed by outpaitent palliative care    Prior Function Level  of Independence: Needs assistance   Gait / Transfers Assistance Needed: Since June, not able to AMB on a daily basis. (per palliative notes). Per prior rehab notes, has had significant difficulty with safe AMB for >12 year.           Hand Dominance        Extremity/Trunk Assessment                Communication      Cognition   Behavior During Therapy: Flat affect Overall  Cognitive Status: History of cognitive impairments - at baseline                                 General Comments: poor problem solving, poor safety awareness, delyaed processing, LT memory deficits.      General Comments      Exercises     Assessment/Plan    PT Assessment Patient needs continued PT services  PT Problem List Decreased strength;Decreased cognition;Decreased balance;Decreased mobility;Decreased knowledge of precautions;Decreased safety awareness       PT Treatment Interventions Balance training;DME instruction;Stair training;Functional mobility training;Therapeutic activities;Therapeutic exercise;Patient/family education;Cognitive remediation;Neuromuscular re-education    PT Goals (Current goals can be found in the Care Plan section)  Acute Rehab PT Goals PT Goal Formulation: Patient unable to participate in goal setting Time For Goal Achievement: 12/26/20    Frequency Min 2X/week   Barriers to discharge        Co-evaluation               AM-PAC PT "6 Clicks" Mobility  Outcome Measure Help needed turning from your back to your side while in a flat bed without using bedrails?: Total Help needed moving from lying on your back to sitting on the side of a flat bed without using bedrails?: Total Help needed moving to and from a bed to a chair (including a wheelchair)?: Total Help needed standing up from a chair using your arms (e.g., wheelchair or bedside chair)?: Total Help needed to walk in hospital room?: Total Help needed climbing 3-5 steps with a railing? : Total 6 Click Score: 6    End of Session   Activity Tolerance: No increased pain;Patient tolerated treatment well Patient left: in bed;with call bell/phone within reach;with bed alarm set;with family/visitor present   PT Visit Diagnosis: Difficulty in walking, not elsewhere classified (R26.2);Other abnormalities of gait and mobility (R26.89);Apraxia (R48.2);Muscle weakness  (generalized) (M62.81)    Time: [id number] PT Time Calculation (min) (ACUTE ONLY): 16 min   Charges:   PT Evaluation $PT Eval Moderate Complexity: 1 Mod        3:48 PM, 12/12/20 Etta Grandchild, PT, DPT Physical Therapist - [facility]  [phone] ([place])    [place] [facility] 12/12/2020, 3:43 PM

## 2020-12-12 NOTE — Evaluation (Signed)
Occupational Therapy Evaluation Patient Details Name: Steven Ingram MRN: [id number] DOB: 24-May-1950 Today's Date: 12/12/2020    History of Present Illness Steven Ingram is a 89yoM who comes to [facility] on 8/28 s/p fall at home. WIfe provides care for patient but is on HD TTS. Pt followed by Palliative Care as an outpaient. PM: dementia, DM, CVAx2 (June 2020, April 2021), HTN, HLD, protein/calorie malnutrition, CKD3, CAD, dCHF.   Clinical Impression   Steven Ingram presents today with generalized weakness, flat affect, limited endurance, impaired balance, and cognitive impairment. He is oriented to self only, is unable to follow basic, one-step commands, exhibits impairments with motor planning. He requires Max-Total A for supine<>sit and is ultimately unable to come into EOB sit, instead demonstrating posterior and right lateral lean. Pt is largely non-verbal. No caregiver present to confirm Steven Ingram prior living situation, PLOF, or baseline fxl mobility. Will continue to follow pt during hospitalization. Anticipate the pt will require SNF-level care post DC.     Follow Up Recommendations  SNF;Supervision/Assistance - 24 hour    Equipment Recommendations       Recommendations for Other Services       Precautions / Restrictions Precautions Precautions: Fall Restrictions Weight Bearing Restrictions: No      Mobility Bed Mobility Overal bed mobility: Needs Assistance Bed Mobility: Supine to Sit;Sit to Supine     Supine to sit: Max assist Sit to supine: Total assist;+2 for physical assistance   General bed mobility comments: cannot come into sitting EOB    Transfers Overall transfer level:  (not attempted; pt unable to follow commands for postural control, balance; appears to have some motor apraxia.)               General transfer comment: unable/unsafe to attempt    Balance Overall balance assessment: Needs assistance Sitting-balance support: Bilateral upper  extremity supported Sitting balance-Leahy Scale: Poor   Postural control: Posterior lean   Standing balance-Leahy Scale: Zero                             ADL either performed or assessed with clinical judgement   ADL Overall ADL's : Needs assistance/impaired                     Lower Body Dressing: Maximal assistance Lower Body Dressing Details (indicate cue type and reason): donning socks             Functional mobility during ADLs: +2 for physical assistance;Maximal assistance       Vision         Perception     Praxis      Pertinent Vitals/Pain Pain Assessment: No/denies pain     Hand Dominance     Extremity/Trunk Assessment Upper Extremity Assessment Upper Extremity Assessment: Generalized weakness   Lower Extremity Assessment Lower Extremity Assessment: Generalized weakness       Communication     Cognition Arousal/Alertness: Lethargic Behavior During Therapy: Flat affect Overall Cognitive Status: History of cognitive impairments - at baseline                                 General Comments: oriented to self only; unable to follow simple, one-step directions   General Comments       Exercises Other Exercises Other Exercises: Bed mobility, cognitive engagement, LB dressing, transfers   Shoulder Instructions  Home Living Family/patient expects to be discharged to:: Unsure Living Arrangements: Spouse/significant other Available Help at Discharge: Family;Available 24 hours/day Type of Home: House Home Access: Stairs to enter [employer] of Steps: 3-4 Entrance Stairs-Rails: Right;Left;Can reach both Home Layout: Laundry or work area in basement;One level     Bathroom Shower/Tub: Tub/shower unit;Door   [employer] Toilet: Standard     Home Equipment: Environmental consultant - 2 wheels   Additional Comments: followed by outpt palliative care      Prior Functioning/Environment Level of Independence:  Needs assistance  Gait / Transfers Assistance Needed: Since June, not able to AMB on a daily basis. (per palliative notes). Per prior rehab notes, has had significant difficulty with safe AMB for >12 year.     Comments: pt unable to provide any info re: living situation or PLOF -- all info gathered from chart review        OT Problem List: Decreased strength;Impaired balance (sitting and/or standing);Decreased cognition;Decreased safety awareness;Decreased activity tolerance      OT Treatment/Interventions: Self-care/ADL training;Therapeutic activities;Balance training;Patient/family education;Therapeutic exercise    OT Goals(Current goals can be found in the care plan section) Acute Rehab OT Goals OT Goal Formulation: Patient unable to participate in goal setting Time For Goal Achievement: 12/26/20 Potential to Achieve Goals: Good ADL Goals Pt Will Perform Grooming: sitting;with set-up;with min assist Pt Will Perform Upper Body Dressing: with min assist;sitting Pt Will Transfer to Toilet: bedside commode;stand pivot transfer;with min assist (using LRAD)  OT Frequency: Min 1X/week   Barriers to D/C:            Co-evaluation              AM-PAC OT "6 Clicks" Daily Activity     Outcome Measure Help from another person eating meals?: A Little Help from another person taking care of personal grooming?: A Lot Help from another person toileting, which includes using toliet, bedpan, or urinal?: A Lot Help from another person bathing (including washing, rinsing, drying)?: A Lot Help from another person to put on and taking off regular upper body clothing?: A Lot Help from another person to put on and taking off regular lower body clothing?: A Lot 6 Click Score: 13   End of Session    Activity Tolerance: Patient limited by lethargy Patient left: in bed;with call bell/phone within reach;with bed alarm set  OT Visit Diagnosis: Other abnormalities of gait and mobility  (R26.89);Muscle weakness (generalized) (M62.81);Adult, failure to thrive (R62.7)                Time: [id number] OT Time Calculation (min): 12 min Charges:  OT General Charges $OT Visit: 1 Visit OT Evaluation $OT Eval Moderate Complexity: 1 Mod OT Treatments $Self Care/Home Management : 8-22 mins Steven Lobo, PhD, MS, OTR/L 12/12/20, 4:16 PM

## 2020-12-12 NOTE — ED Notes (Signed)
US at bedside

## 2020-12-12 NOTE — ED Notes (Addendum)
Assisted with eating applesauce, crackers, juice

## 2020-12-12 NOTE — Hospital Course (Addendum)
70 year old male with a known history of dementia, diabetes, history of CVA, hypertension, hyperlipidemia, moderate protein calorie malnutrition, CKD stage IIIa, CAD, chronic diastolic CHF is admitted for AKI status post fall and failure to thrive.  8/29: Renal ultrasound to rule out any underlying GU obstruction 8/30: Waiting for SNF

## 2020-12-12 NOTE — ED Notes (Signed)
Pt clean and dry, no urine noted in suction canister, bladder soft and not distended

## 2020-12-12 NOTE — Progress Notes (Addendum)
1       [facility] [place] NAME: Steven Ingram    MR#:  [id number]  PCP: Inc, [place] OF BIRTH:  [birth year]  SUBJECTIVE:  CHIEF COMPLAINT:   Chief Complaint  Patient presents with  . Aphasia  . trouble walking  Patient would not talk and unable to provide any history REVIEW OF SYSTEMS:  ROS unable to obtain due to his dementia DRUG ALLERGIES:   Allergies  Allergen Reactions  . Ciprofloxacin Other (See Comments)    Possibly contributed to seizure-like activity resulting in 06/15/20 admission    VITALS:  Blood pressure (!) 142/70, pulse 74, temperature 98.3 F (36.8 C), temperature source Oral, resp. rate 17, height '5\' 9"'$  (1.753 m), weight 87 kg, SpO2 99 %. PHYSICAL EXAMINATION:  Physical Exam 70 year old male lying in the bed comfortably without any acute distress Eyes pupil equal round reactive to light and accommodation, no scleral icterus Lungs clear to auscultation bilaterally no wheezing rales rhonchi crepitation Cardiovascular S1-S2 normal, no murmur rales or gallop Abdomen soft, benign Neuro nonfocal, he is awake but not oriented. Skin no rash or lesion LABORATORY PANEL:  Male CBC Recent Labs  Lab 12/12/20 0737  WBC 7.1  HGB 9.6*  HCT 28.9*  PLT 186   ------------------------------------------------------------------------------------------------------------------ Chemistries  Recent Labs  Lab 12/12/20 0737  NA 140  K 3.8  CL 103  CO2 30  GLUCOSE 236*  BUN 32*  CREATININE 2.32*  CALCIUM 8.2*  AST 18  ALT 17  ALKPHOS 35*  BILITOT 0.9   MEDICATIONS:  Scheduled Meds: . heparin  5,000 Units Subcutaneous Q8H  . insulin aspart  0-5 Units Subcutaneous QHS  . insulin aspart  0-9 Units Subcutaneous TID WC   Continuous Infusions: .  sodium bicarbonate (isotonic) infusion in sterile water 125 mL/hr at 12/12/20 1004   RADIOLOGY:  CT HEAD WO CONTRAST  Result Date: 12/11/2020 CLINICAL DATA:  Altered  mental status. EXAM: CT HEAD WITHOUT CONTRAST TECHNIQUE: Contiguous axial images were obtained from the base of the skull through the vertex without intravenous contrast. COMPARISON:  June 15, 2020. FINDINGS: Brain: Mild diffuse cortical atrophy is noted. Mild chronic ischemic white matter disease is noted. No mass effect or midline shift is noted. Ventricular size is within normal limits. There is no evidence of mass lesion, hemorrhage or acute infarction. Vascular: No hyperdense vessel or unexpected calcification. Skull: Normal. Negative for fracture or focal lesion. Sinuses/Orbits: No acute finding. Other: Small left posterior scalp hematoma is noted. IMPRESSION: Small left posterior scalp hematoma. No acute intracranial abnormality seen. Electronically Signed   By: Marijo Conception M.D.   On: 12/11/2020 15:57   DG Chest Portable 1 View  Result Date: 12/11/2020 CLINICAL DATA:  Cough EXAM: PORTABLE CHEST 1 VIEW COMPARISON:  03/31/2020 FINDINGS: The heart size and mediastinal contours are within normal limits. Both lungs are clear. The visualized skeletal structures are unremarkable. IMPRESSION: No active disease. Electronically Signed   By: Ulyses Jarred M.D.   On: 12/11/2020 22:03   ASSESSMENT AND PLAN:  70 year old male with a known history of dementia, diabetes, history of CVA, hypertension, hyperlipidemia, moderate protein calorie malnutrition, CKD stage IIIa, CAD, chronic diastolic CHF is admitted for AKI status post fall and failure to thrive.  8/29: Renal ultrasound to rule out any underlying GU obstruction  Principal Problem:   Acute kidney injury superimposed on CKD ([place]) Active Problems:   Type 2 diabetes mellitus with stage 3 chronic  kidney disease ([place])   CAD (coronary artery disease)   Hypertension associated with diabetes ([place])   HLD (hyperlipidemia)   Chronic diastolic CHF (congestive heart failure) (HCC)   Dementia without behavioral disturbance ([place])   Hyperlipidemia associated  with type 2 diabetes mellitus (HCC)   Cognitive deficit, post-stroke   FTT (failure to thrive) in adult   AKI (acute kidney injury) ([place])  AKI with underlying CKD stage IIIa Creatinine 2.3  Will obtain renal ultrasound to rule out any underlying obstruction.  Likely prerenal with underlying dehydration and poor p.o. intake Continue aggressive hydration  Failure to thrive and recent fall Wife reports [facility] for similar reason earlier in the year and is interested to do same if he qualifies -PT and OT eval -His fall is likely multifactorial.  CT head is negative for acute pathology except small left posterior scalp hematoma  Type 2 diabetes Sliding scale insulin while in the hospital  Chronic diastolic CHF Well compensated at this time  Essential hypertension Blood pressure well controlled at this time.  No need of antihypertensive for now  Goals of care Consult palliative care  Body mass index is 28.32 kg/m.  Net IO Since Admission: 620.83 mL [12/12/20 1200]      LOS: 1 day   Consultants: PT, OT   Status is: Inpatient  Remains inpatient appropriate because:Unsafe d/c plan  Dispo: The patient is from: Home              Anticipated d/c is to: SNF              Patient currently is not medically stable to d/c.   Difficult to place patient No    DVT prophylaxis:       heparin injection 5,000 Units Start: 12/12/20 0000     Family Communication: (Updated patient's wife over phone)   All the records are reviewed and case discussed with Nursing and TOC team. Management plans discussed with the patient, family and they are in agreement.  CODE STATUS: Full Code Level of care: Med-Surg  TOTAL TIME TAKING CARE OF THIS PATIENT: 35 minutes.   More than 50% of the time was spent in counseling/coordination of care: YES  POSSIBLE D/C IN 2-3 DAYS, DEPENDING ON CLINICAL CONDITION.   Max Sane M.D on 12/12/2020 at 12:00 PM  [facility]   CC: Primary care physician; [employer], [employer]  Note: This dictation was prepared with Diplomatic Services operational officer dictation along with smaller Company secretary. Any transcriptional errors that result from this process are unintentional.

## 2020-12-12 NOTE — ED Notes (Signed)
Phlebotomy called for morning lab draw, states they came to ER for all morning lab, will look for blood in lab and come draw new specimens if needed.

## 2020-12-13 DIAGNOSIS — Z515 Encounter for palliative care: Secondary | ICD-10-CM

## 2020-12-13 DIAGNOSIS — Z7189 Other specified counseling: Secondary | ICD-10-CM

## 2020-12-13 LAB — CBC
HCT: 27.5 % — ABNORMAL LOW (ref 39.0–52.0)
Hemoglobin: 9.4 g/dL — ABNORMAL LOW (ref 13.0–17.0)
MCH: 33.8 pg (ref 26.0–34.0)
MCHC: 34.2 g/dL (ref 30.0–36.0)
MCV: 98.9 fL (ref 80.0–100.0)
Platelets: 181 10*3/uL (ref 150–400)
RBC: 2.78 MIL/uL — ABNORMAL LOW (ref 4.22–5.81)
RDW: 11.7 % (ref 11.5–15.5)
WBC: 5.6 10*3/uL (ref 4.0–10.5)
nRBC: 0 % (ref 0.0–0.2)

## 2020-12-13 LAB — BASIC METABOLIC PANEL
Anion gap: 8 (ref 5–15)
BUN: 24 mg/dL — ABNORMAL HIGH (ref 8–23)
CO2: 35 mmol/L — ABNORMAL HIGH (ref 22–32)
Calcium: 8 mg/dL — ABNORMAL LOW (ref 8.9–10.3)
Chloride: 100 mmol/L (ref 98–111)
Creatinine, Ser: 1.63 mg/dL — ABNORMAL HIGH (ref 0.61–1.24)
GFR, Estimated: 45 mL/min — ABNORMAL LOW (ref >60)
Glucose, Bld: 159 mg/dL — ABNORMAL HIGH (ref 70–99)
Potassium: 3.1 mmol/L — ABNORMAL LOW (ref 3.5–5.1)
Sodium: 143 mmol/L (ref 135–145)

## 2020-12-13 LAB — MAGNESIUM: Magnesium: 2 mg/dL (ref 1.7–2.4)

## 2020-12-13 LAB — GLUCOSE, CAPILLARY
Glucose-Capillary: 147 mg/dL — ABNORMAL HIGH (ref 70–99)
Glucose-Capillary: 170 mg/dL — ABNORMAL HIGH (ref 70–99)
Glucose-Capillary: 162 mg/dL — ABNORMAL HIGH (ref 70–99)
Glucose-Capillary: 121 mg/dL — ABNORMAL HIGH (ref 70–99)

## 2020-12-13 MED ORDER — METOPROLOL TARTRATE 50 MG PO TABS
50.0000 mg | ORAL_TABLET | Freq: Every day | ORAL | Status: DC
  Administered 2020-12-13 – 2020-12-16 (×3): 50 mg via ORAL
  Filled 2020-12-13 (×4): qty 1

## 2020-12-13 MED ORDER — RISPERIDONE 0.25 MG PO TABS
0.2500 mg | ORAL_TABLET | Freq: Every day | ORAL | Status: DC
  Administered 2020-12-13 – 2020-12-16 (×4): 0.25 mg via ORAL
  Filled 2020-12-13 (×4): qty 1

## 2020-12-13 MED ORDER — TAMSULOSIN HCL 0.4 MG PO CAPS
0.4000 mg | ORAL_CAPSULE | Freq: Two times a day (BID) | ORAL | Status: DC
  Administered 2020-12-13 – 2020-12-16 (×7): 0.4 mg via ORAL
  Filled 2020-12-13 (×7): qty 1

## 2020-12-13 MED ORDER — CLOPIDOGREL BISULFATE 75 MG PO TABS
75.0000 mg | ORAL_TABLET | Freq: Every day | ORAL | Status: DC
  Administered 2020-12-13 – 2020-12-16 (×4): 75 mg via ORAL
  Filled 2020-12-13 (×4): qty 1

## 2020-12-13 MED ORDER — SERTRALINE HCL 50 MG PO TABS
25.0000 mg | ORAL_TABLET | Freq: Every day | ORAL | Status: DC
  Administered 2020-12-13 – 2020-12-16 (×4): 25 mg via ORAL
  Filled 2020-12-13 (×4): qty 1

## 2020-12-13 MED ORDER — POTASSIUM CHLORIDE CRYS ER 20 MEQ PO TBCR
40.0000 meq | EXTENDED_RELEASE_TABLET | Freq: Once | ORAL | Status: AC
  Administered 2020-12-13: 40 meq via ORAL
  Filled 2020-12-13: qty 2

## 2020-12-13 MED ORDER — ATORVASTATIN CALCIUM 20 MG PO TABS
80.0000 mg | ORAL_TABLET | Freq: Every day | ORAL | Status: DC
  Administered 2020-12-13: 18:00:00 80 mg via ORAL
  Filled 2020-12-13 (×4): qty 4

## 2020-12-13 MED ORDER — GLUCERNA SHAKE PO LIQD
237.0000 mL | Freq: Three times a day (TID) | ORAL | Status: DC
  Administered 2020-12-13 – 2020-12-16 (×7): 237 mL via ORAL

## 2020-12-13 NOTE — Consult Note (Signed)
Consultation Note Date: 12/13/2020   Patient Name: Steven Ingram  DOB: [birth year]  MRN: [id number]  Age / Sex: 70 y.o., male  PCP: Inc, [place] Referring Physician: Max Sane, MD  Reason for Consultation:   HPI/Patient Profile: 70 y.o. male  with past medical history of mod dementia, CVA with dysphagia deficit, DM, ataxia, admitted on 12/11/2020 with acute on chronic kidney injury (concern for poor po intake), failure to thrive with recent fall. Palliative medicine consulted for [place].    Primary Decision Maker NEXT OF KIN- spouse  Discussion: Evaluated patient. He was awake and alert. Telling me he was waiting on someone. Pleasantly confused. Speech was clear.  I called his spouse- she was on dialysis and unable to talk.  Chart reviewed- Patient is followed outpatient by [facility]- last seen on 8/17. At that visit dysphagia was discussed and recommendations for minced meats and soft foods were made after review of SLP documentation.  Spouse was also in process of applying for PACE so patient could receive additional help in the home while she attends dialysis appointments. Per 8/17 note patient was ambulatory that day and dressed himself.  Prior [place] discussion on 6/15 indicated advanced care planning discussion with living will and MOST completion- elections made for full code and limited interventions per note, however, MOST form is not in chart.    SUMMARY OF RECOMMENDATIONS -Continue current level of care -Plan to meet with patient's spouse tomorrow at [place] for f/u [place] discussion    Code Status/Advance Care Planning: Full code   Prognosis:   Unable to determine  Discharge Planning: To Be Determined  Primary Diagnoses: Present on Admission:  Acute kidney injury superimposed on CKD (HCC)  CAD (coronary artery disease)  Chronic diastolic CHF (congestive  heart failure) (HCC)  Dementia without behavioral disturbance (HCC)  HLD (hyperlipidemia)  Hyperlipidemia associated with type 2 diabetes mellitus ([place])  Hypertension associated with diabetes ([place])  Type 2 diabetes mellitus with stage 3 chronic kidney disease (HCC)  FTT (failure to thrive) in adult  AKI (acute kidney injury) ([place])   Review of Systems  Unable to perform ROS: Dementia   Physical Exam Vitals and nursing note reviewed.  Constitutional:      General: He is awake.  Pulmonary:     Effort: Pulmonary effort is normal. No respiratory distress.  Neurological:     Mental Status: He is alert. He is disoriented.  Psychiatric:        Behavior: Behavior is cooperative.     Comments: Pleasantly confused    Vital Signs: BP (!) 158/72   Pulse 77   Temp 98.7 F (37.1 C) (Oral)   Resp 18   Ht '5\' 9"'$  (1.753 m)   Wt 87 kg   SpO2 100%   BMI 28.32 kg/m  Pain Scale: 0-10   Pain Score: 0-No pain   SpO2: SpO2: 100 % O2 Device:SpO2: 100 % O2 Flow Rate: .   IO: Intake/output summary:  Intake/Output Summary (Last 24 hours) at 12/13/2020 1246  Last data filed at 12/13/2020 1213 Gross per 24 hour  Intake 3750.82 ml  Output --  Net 3750.82 ml    LBM: Last BM Date:  (unknown) Baseline Weight: Weight: 87 kg Most recent weight: Weight: 87 kg     Palliative Assessment/Data: PPS: 60%   Thank you for this consult. Palliative medicine will continue to follow and assist as needed.   Time In: 1201 Time Out: 1259 Time Total: 58 mins Greater than 50%  of this time was spent counseling and coordinating care related to the above assessment and plan.  Signed by: Mariana Kaufman, AGNP-C Palliative Medicine    Please contact Palliative Medicine Team phone at [phone] for questions and concerns.  For individual provider: See Shea Evans

## 2020-12-13 NOTE — Progress Notes (Signed)
Initial Nutrition Assessment  DOCUMENTATION CODES:  Not applicable  INTERVENTION:  Liberalize diet to carb modified for poor PO intake PTA and no indication for renal diet. Glucerna Shake po TID, each supplement provides 220 kcal and 10 grams of protein Obtain new measured weight  NUTRITION DIAGNOSIS:  Inadequate oral intake related to poor appetite as evidenced by per patient/family report.  GOAL:  Patient will meet greater than or equal to 90% of their needs  MONITOR:  PO intake, Supplement acceptance, Weight trends  REASON FOR ASSESSMENT:  Malnutrition Screening Tool    ASSESSMENT:  70 y.o. male with hx of dementia, CVA with residual dysphagia, CKD 3 s/p right nephrectomy, HTN, HLD, DM, CAD, and CHF presented to ED with worsening weakness after a fall 4 days prior. Wife reported poor intake and overall FTT since fall.   Pt found to have an AKI on admission. Improved with IVF. Palliative care consulted to assist with [place] discussions. Therapy recommends SNF at dc.   Pt with dementia unable to provide a nutrition hx at this time. Not oriented to place or situation. Pt appears to have good intake this admission, but limited meals recorded. No measured weight this admission, current is estimated based on last encounter in March. Will request new measured.  Average Meal Intake: 8/29: 100% intake x 1 recorded meal  Nutritionally Relevant Medications: Scheduled Meds:  atorvastatin  80 mg Oral q1800   insulin aspart  0-5 Units Subcutaneous QHS   insulin aspart  0-9 Units Subcutaneous TID WC   PRN Meds: calcium carbonate (dosed in mg elemental calcium), docusate sodium, ondansetron, sorbitol  Labs Reviewed: K 3.1 BUN 24, creatinine 1.63 SBG ranges from 147-221 mg/dL over the last 24 hours HgbA1 5.7% (8/29)  NUTRITION - FOCUSED PHYSICAL EXAM: Defer to in-person assessment  Diet Order:   Diet Order             Diet Carb Modified Fluid consistency: Thin; Room service  appropriate? Yes with Assist  Diet effective now                   EDUCATION NEEDS:  Not appropriate for education at this time  Skin:  Skin Assessment: Reviewed RN Assessment  Last BM:  8/30 - type 6  Height:  Ht Readings from Last 1 Encounters:  12/11/20 '5\' 9"'$  (1.753 m)    Weight:  Wt Readings from Last 1 Encounters:  12/11/20 87 kg    Ideal Body Weight:  72.7 kg  BMI:  Body mass index is 28.32 kg/m.  Estimated Nutritional Needs:  Kcal:  1900-2100 kcal/d Protein:  95-105 g/d Fluid:  >2L/d   Ranell Patrick, RD, LDN Clinical Dietitian Pager on Amion

## 2020-12-13 NOTE — NC FL2 (Signed)
[place] LEVEL OF CARE SCREENING TOOL     IDENTIFICATION  Patient Name: Steven Ingram Birthdate: 12-19-1950 Sex: male Admission Date (Current Location): 12/11/2020  [facility] and [place] Number:  Engineering geologist and Address:  [facility], [address], [place], [place] [address]      Provider Number: [id number]  Attending Physician Name and Address:  Max Sane, MD  Relative Name and Phone Number:  Kawliga Crumity (wife) [phone]    Current Level of Care: Hospital Recommended Level of Care: [place] Prior Approval Number:    Date Approved/Denied:   PASRR Number: [id number]  Discharge Plan: SNF    Current Diagnoses: Patient Active Problem List   Diagnosis Date Noted   FTT (failure to thrive) in adult 12/11/2020   AKI (acute kidney injury) ([place]) 12/11/2020   Dysphagia as late effect of stroke 09/28/2020   Seizure-like activity ([place]) 06/15/2020   Hyperlipidemia associated with type 2 diabetes mellitus ([place]) 06/15/2020   Dementia without behavioral disturbance ([place])    Hypokalemia    Encephalopathy acute 123456   Acute metabolic encephalopathy 123456   SIRS (systemic inflammatory response syndrome) ([place]) 03/31/2020   Acute kidney injury superimposed on CKD ([place]) 03/31/2020   HLD (hyperlipidemia) 03/31/2020   Chronic diastolic CHF (congestive heart failure) ([place]) 03/31/2020   Malnutrition of moderate degree 07/20/2019   Dizziness 07/19/2019   Unsteady gait when walking 07/19/2019   History of CVA (cerebrovascular accident) 07/19/2019   Type 2 diabetes mellitus with stage 3 chronic kidney disease ([place]) 07/19/2019   CAD (coronary artery disease) 07/19/2019   Hypertension associated with diabetes ([place]) 07/19/2019   H/O right nephrectomy 07/19/2019   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) 07/19/2019   Cognitive deficit, post-stroke 11/08/2018   CVA (cerebral vascular accident) ([place]) 10/03/2018    Orientation  RESPIRATION BLADDER Height & Weight     Self  Normal External catheter Weight: 87 kg Height:  '5\' 9"'$  (175.3 cm)  BEHAVIORAL SYMPTOMS/MOOD NEUROLOGICAL BOWEL NUTRITION STATUS      Continent Diet (see discharge summary)  AMBULATORY STATUS COMMUNICATION OF NEEDS Skin   Extensive Assist Verbally Normal                       Personal Care Assistance Level of Assistance  Bathing, Feeding, Dressing Bathing Assistance: Maximum assistance Feeding assistance: Limited assistance Dressing Assistance: Maximum assistance     Functional Limitations Info  Sight, Hearing, Speech Sight Info: Adequate Hearing Info: Impaired Speech Info: Adequate    SPECIAL CARE FACTORS FREQUENCY  PT (By licensed PT), OT (By licensed OT)     PT Frequency: 5 times per week OT Frequency: 5 times per week            Contractures Contractures Info: Not present    Additional Factors Info  Code Status, Allergies Code Status Info: Full Allergies Info: Ciprofloxacin           Current Medications (12/13/2020):  This is the current hospital active medication list Current Facility-Administered Medications  Medication Dose Route Frequency Provider Last Rate Last Admin   acetaminophen (TYLENOL) tablet 650 mg  650 mg Oral Q6H PRN Elwyn Reach, MD       Or   acetaminophen (TYLENOL) suppository 650 mg  650 mg Rectal Q6H PRN Elwyn Reach, MD       calcium carbonate (dosed in mg elemental calcium) suspension 500 mg of elemental calcium  500 mg of elemental calcium Oral Q6H PRN  Elwyn Reach, MD       camphor-menthol [facility]) lotion 1 application  1 application Topical [id number] PRN Elwyn Reach, MD       And   hydrOXYzine (ATARAX/VISTARIL) tablet 25 mg  25 mg Oral Q8H PRN Elwyn Reach, MD       docusate sodium (ENEMEEZ) enema 283 mg  1 enema Rectal PRN Elwyn Reach, MD       feeding supplement (NEPRO CARB STEADY) liquid 237 mL  237 mL Oral TID PRN Elwyn Reach, MD       heparin  injection 5,000 Units  5,000 Units Subcutaneous Q8H Elwyn Reach, MD   5,000 Units at 12/13/20 0522   insulin aspart (novoLOG) injection 0-5 Units  0-5 Units Subcutaneous QHS Garba, Mohammad L, MD       insulin aspart (novoLOG) injection 0-9 Units  0-9 Units Subcutaneous TID WC Elwyn Reach, MD   1 Units at 12/13/20 0844   ondansetron (ZOFRAN) tablet 4 mg  4 mg Oral Q6H PRN Elwyn Reach, MD       Or   ondansetron (ZOFRAN) injection 4 mg  4 mg Intravenous Q6H PRN Jonelle Sidle, Mohammad L, MD       sodium bicarbonate 150 mEq in sterile water 1,150 mL infusion   Intravenous Continuous Elwyn Reach, MD 125 mL/hr at 12/13/20 0401 New Bag at 12/13/20 0401   sorbitol 70 % solution 30 mL  30 mL Oral PRN Elwyn Reach, MD       zolpidem (AMBIEN) tablet 5 mg  5 mg Oral QHS PRN Elwyn Reach, MD         Discharge Medications: Please see discharge summary for a list of discharge medications.  Relevant Imaging Results:  Relevant Lab Results:   Additional Information SS #: [id number]  Shelbie Hutching, RN

## 2020-12-13 NOTE — [place] Notes (Addendum)
1       [facility] [place] NAME: Steven Ingram    MR#:  [id number]  PCP: Inc, [place] OF BIRTH:  March 22, 1951  SUBJECTIVE:  CHIEF COMPLAINT:   Chief Complaint  Patient presents with  . Aphasia  . trouble walking  No new issues.  Feeling weak.  Agreeable for placement REVIEW OF SYSTEMS:  ROS unable to obtain due to his dementia DRUG ALLERGIES:   Allergies  Allergen Reactions  . Ciprofloxacin Other (See Comments)    Possibly contributed to seizure-like activity resulting in 06/15/20 admission    VITALS:  Blood pressure (!) 158/72, pulse 77, temperature 98.7 F (37.1 C), temperature source Oral, resp. rate 18, height '5\' 9"'$  (1.753 m), weight 87 kg, SpO2 100 %. PHYSICAL EXAMINATION:  Physical Exam 70 year old male lying in the bed comfortably without any acute distress Eyes pupil equal round reactive to light and accommodation, no scleral icterus Lungs clear to auscultation bilaterally no wheezing rales rhonchi crepitation Cardiovascular S1-S2 normal, no murmur rales or gallop Abdomen soft, benign Neuro nonfocal, he is awake but oriented to only names Skin no rash or lesion LABORATORY PANEL:  Male CBC Recent Labs  Lab 12/13/20 0552  WBC 5.6  HGB 9.4*  HCT 27.5*  PLT 181   ------------------------------------------------------------------------------------------------------------------ Chemistries  Recent Labs  Lab 12/12/20 0737 12/13/20 0552  NA 140 143  K 3.8 3.1*  CL 103 100  CO2 30 35*  GLUCOSE 236* 159*  BUN 32* 24*  CREATININE 2.32* 1.63*  CALCIUM 8.2* 8.0*  MG  --  2.0  AST 18  --   ALT 17  --   ALKPHOS 35*  --   BILITOT 0.9  --    MEDICATIONS:  Scheduled Meds: . atorvastatin  80 mg Oral q1800  . clopidogrel  75 mg Oral Daily  . heparin  5,000 Units Subcutaneous Q8H  . insulin aspart  0-5 Units Subcutaneous QHS  . insulin aspart  0-9 Units Subcutaneous TID WC  . metoprolol tartrate  50 mg Oral  Daily  . risperiDONE  0.25 mg Oral Daily  . sertraline  25 mg Oral Daily  . tamsulosin  0.4 mg Oral BID   Continuous Infusions: .  sodium bicarbonate (isotonic) infusion in sterile water Stopped (12/13/20 0919)   RADIOLOGY:  US RENAL  Result Date: 12/12/2020 CLINICAL DATA:  AKI EXAM: RENAL / URINARY TRACT ULTRASOUND COMPLETE COMPARISON:  None. FINDINGS: Right Kidney: Post nephrectomy. Left Kidney: Renal measurements: 12.4 x 8 x 6.6 cm = volume: 345 mL. Echogenicity within normal limits. There is a 1.9 cm cyst at the lower pole. No solid mass or hydronephrosis visualized. Bladder: Appears normal for degree of bladder distention. Other: Prostate volume 33 mL. IMPRESSION: No hydronephrosis. Electronically Signed   By: Macy Mis M.D.   On: 12/12/2020 13:39   ASSESSMENT AND PLAN:  70 year old male with a known history of dementia, diabetes, history of CVA, hypertension, hyperlipidemia, moderate protein calorie malnutrition, CKD stage IIIa, CAD, chronic diastolic CHF is admitted for AKI status post fall and failure to thrive.  8/29: Renal ultrasound to rule out any underlying GU obstruction 8/30: Waiting for SNF  Principal Problem:   Acute kidney injury superimposed on CKD [facility]) Active Problems:   Type 2 diabetes mellitus with stage 3 chronic kidney disease ([place])   CAD (coronary artery disease)   Hypertension associated with diabetes ([place])   HLD (hyperlipidemia)   Chronic diastolic CHF (congestive heart failure) (  [place])   Dementia without behavioral disturbance ([place])   Hyperlipidemia associated with type 2 diabetes mellitus (HCC)   Cognitive deficit, post-stroke   FTT (failure to thrive) in adult   AKI (acute kidney injury) ([place])  AKI with underlying CKD stage IIIa Creatinine 2.3 -> 1.6 Renal ultrasound negative for any obstruction.  Likely prerenal with underlying dehydration and poor p.o. intake Kidney function improving with hydration  Failure to thrive and recent fall Wife  reports [facility] for similar reason earlier in the year and is interested to do same if he qualifies -PT and OT recommends-SNF.  TOC aware and working on placement -His fall is likely multifactorial.  CT head is negative for acute pathology except small left posterior scalp hematoma  Hypokalemia Replete and recheck, check magnesium  Type 2 diabetes Sliding scale insulin while in the hospital  Chronic diastolic CHF Well compensated at this time  Essential hypertension Blood pressure going up.  We will start metoprolol 50 mg p.o. daily  Depression and anxiety Resume home dose of Zoloft, Risperdal, Atarax and Ambien  Goals of care Consult palliative care  Body mass index is 28.32 kg/m.  Net IO Since Admission: 4,371.65 mL [12/13/20 1233]      LOS: 2 days   Consultants: PT, OT   Status is: Inpatient  Remains inpatient appropriate because:Unsafe d/c plan  Dispo: The patient is from: Home              Anticipated d/c is to: SNF              Patient currently is not medically stable to d/c.   Difficult to place patient No    DVT prophylaxis:       heparin injection 5,000 Units Start: 12/12/20 0000     Family Communication: (Updated patient's wife over phone)   All the records are reviewed and case discussed with Nursing and TOC team. Management plans discussed with the patient, family and they are in agreement.  CODE STATUS: Full Code Level of care: Med-Surg  TOTAL TIME TAKING CARE OF THIS PATIENT: 35 minutes.   More than 50% of the time was spent in counseling/coordination of care: YES  POSSIBLE D/C IN 1-2 DAYS, DEPENDING ON CLINICAL CONDITION.  And SNF availability   Max Sane M.D on 12/13/2020 at 12:33 PM  [facility]   CC: Primary care physician; [employer], [employer]  Note: This dictation was prepared with Diplomatic Services operational officer dictation along with smaller Company secretary. Any transcriptional errors that result  from this process are unintentional.

## 2020-12-13 NOTE — TOC Initial Note (Signed)
Transition of Care [facility]) - Initial/Assessment Note    Patient Details  Name: Steven Ingram MRN: [id number] Date of Birth: 1950-12-19  Transition of Care [facility]) CM/SW Contact:    Shelbie Hutching, RN Phone Number: 12/13/2020, 11:18 AM  Clinical Narrative:                 Patient admitted to the hospital with AKI and failure to thrive.  Patient has a history of dementia, previous CVA, and chronic kidney disease.  RNCM met with patient at the bedside.  Patient is confused and believed that he was at [facility] in the gym.  RNCM called and left a message with patient's wife for return call.  PT and OT are recommending SNF and supervision 24 hour/day.  Patient has been to [facility] in the past.  RNCM will begin SNF workup in anticipation of SNF discharge.    Expected Discharge Plan: Skilled Nursing Facility Barriers to Discharge: Continued Medical Work up   Patient Goals and CMS Choice   CMS Medicare.gov Compare Post Acute Care list provided to:: Patient Represenative (must comment) Choice offered to / list presented to : Spouse  Expected Discharge Plan and Services Expected Discharge Plan: [place]   Discharge Planning Services: CM Consult Post Acute Care Choice: [place] Living arrangements for the past 2 months: Single Family Home                 DME Arranged: N/A DME Agency: NA       HH Arranged: NA          Prior Living Arrangements/Services Living arrangements for the past 2 months: Single Family Home Lives with:: Spouse Patient language and need for interpreter reviewed:: Yes Do you feel safe going back to the place where you live?: Yes      Need for Family Participation in Patient Care: Yes (Comment) (dementia) Care giver support system in place?: Yes (comment) (wife) Current home services: DME Criminal Activity/Legal Involvement Pertinent to Current Situation/Hospitalization: No - Comment as needed  Activities of Daily Living Home Assistive  Devices/Equipment: Walker (specify type) ADL Screening (condition at time of admission) Patient's cognitive ability adequate to safely complete daily activities?: No Is the patient deaf or have difficulty hearing?: No Does the patient have difficulty seeing, even when wearing glasses/contacts?: No Does the patient have difficulty concentrating, remembering, or making decisions?: Yes Patient able to express need for assistance with ADLs?: No Does the patient have difficulty dressing or bathing?: Yes Independently performs ADLs?: No Communication: Independent Dressing (OT): Needs assistance Is this a change from baseline?: Pre-admission baseline Grooming: Needs assistance Is this a change from baseline?: Pre-admission baseline Feeding: Independent Bathing: Needs assistance Is this a change from baseline?: Pre-admission baseline Toileting: Needs assistance Is this a change from baseline?: Pre-admission baseline In/Out Bed: Needs assistance Is this a change from baseline?: Pre-admission baseline Walks in Home: Needs assistance Is this a change from baseline?: Pre-admission baseline Does the patient have difficulty walking or climbing stairs?: Yes Weakness of Legs: Both Weakness of Arms/Hands: None  Permission Sought/Granted Permission sought to share information with : Case Manager, Customer service manager, Family Supports Permission granted to share information with : Yes, Verbal Permission Granted  Share Information with NAME: [name]  Permission granted to share info w AGENCY: SNF's  Permission granted to share info w Relationship: wife     Emotional Assessment Appearance:: Appears stated age Attitude/Demeanor/Rapport: Engaged Affect (typically observed): Accepting Orientation: : Oriented to Self Alcohol /  Substance Use: Not Applicable Psych Involvement: No (comment)  Admission diagnosis:  Failure to thrive in adult [R62.7] AKI (acute kidney injury) ([place])  [N17.9] Patient Active Problem List   Diagnosis Date Noted   FTT (failure to thrive) in adult 12/11/2020   AKI (acute kidney injury) ([place]) 12/11/2020   Dysphagia as late effect of stroke 09/28/2020   Seizure-like activity ([place]) 06/15/2020   Hyperlipidemia associated with type 2 diabetes mellitus ([place]) 06/15/2020   Dementia without behavioral disturbance ([place])    Hypokalemia    Encephalopathy acute 22/24/1146   Acute metabolic encephalopathy 43/14/2767   SIRS (systemic inflammatory response syndrome) ([place]) 03/31/2020   Acute kidney injury superimposed on CKD ([place]) 03/31/2020   HLD (hyperlipidemia) 03/31/2020   Chronic diastolic CHF (congestive heart failure) ([place]) 03/31/2020   Malnutrition of moderate degree 07/20/2019   Dizziness 07/19/2019   Unsteady gait when walking 07/19/2019   History of CVA (cerebrovascular accident) 07/19/2019   Type 2 diabetes mellitus with stage 3 chronic kidney disease ([place]) 07/19/2019   CAD (coronary artery disease) 07/19/2019   Hypertension associated with diabetes ([place]) 07/19/2019   H/O right nephrectomy 07/19/2019   Left pontine stroke ([place]) 07/19/2019   Cognitive deficit, post-stroke 11/08/2018   CVA (cerebral vascular accident) ([place]) 10/03/2018   PCP:  [facility], [place]:   [id number] [place] [id number]: [phone]: [phone]    Social Determinants of Health (SDOH) Interventions    Readmission Risk Interventions Readmission Risk Prevention Plan 10/04/2018  Post Dischage Appt Not Complete  Medication Screening Complete  Transportation Screening Complete

## 2020-12-14 DIAGNOSIS — F0150 Vascular dementia without behavioral disturbance: Secondary | ICD-10-CM

## 2020-12-14 LAB — BASIC METABOLIC PANEL
Anion gap: 7 (ref 5–15)
BUN: 20 mg/dL (ref 8–23)
CO2: 34 mmol/L — ABNORMAL HIGH (ref 22–32)
Calcium: 8.1 mg/dL — ABNORMAL LOW (ref 8.9–10.3)
Chloride: 101 mmol/L (ref 98–111)
Creatinine, Ser: 1.43 mg/dL — ABNORMAL HIGH (ref 0.61–1.24)
GFR, Estimated: 52 mL/min — ABNORMAL LOW (ref >60)
Glucose, Bld: 207 mg/dL — ABNORMAL HIGH (ref 70–99)
Potassium: 3.2 mmol/L — ABNORMAL LOW (ref 3.5–5.1)
Sodium: 142 mmol/L (ref 135–145)

## 2020-12-14 LAB — GLUCOSE, CAPILLARY
Glucose-Capillary: 160 mg/dL — ABNORMAL HIGH (ref 70–99)
Glucose-Capillary: 215 mg/dL — ABNORMAL HIGH (ref 70–99)
Glucose-Capillary: 172 mg/dL — ABNORMAL HIGH (ref 70–99)

## 2020-12-14 LAB — CBC
HCT: 31.1 % — ABNORMAL LOW (ref 39.0–52.0)
Hemoglobin: 10.6 g/dL — ABNORMAL LOW (ref 13.0–17.0)
MCH: 33.5 pg (ref 26.0–34.0)
MCHC: 34.1 g/dL (ref 30.0–36.0)
MCV: 98.4 fL (ref 80.0–100.0)
Platelets: 191 10*3/uL (ref 150–400)
RBC: 3.16 MIL/uL — ABNORMAL LOW (ref 4.22–5.81)
RDW: 11.5 % (ref 11.5–15.5)
WBC: 5.1 10*3/uL (ref 4.0–10.5)
nRBC: 0 % (ref 0.0–0.2)

## 2020-12-14 MED ORDER — POTASSIUM CHLORIDE CRYS ER 20 MEQ PO TBCR
40.0000 meq | EXTENDED_RELEASE_TABLET | Freq: Two times a day (BID) | ORAL | Status: AC
  Administered 2020-12-14 (×2): 40 meq via ORAL
  Filled 2020-12-14 (×2): qty 2

## 2020-12-14 MED ORDER — LISINOPRIL 20 MG PO TABS
20.0000 mg | ORAL_TABLET | Freq: Every day | ORAL | Status: DC
  Administered 2020-12-15 – 2020-12-16 (×2): 20 mg via ORAL
  Filled 2020-12-14 (×2): qty 1

## 2020-12-14 NOTE — Progress Notes (Addendum)
Physical Therapy Treatment Patient Details Name: Steven Ingram MRN: [id number] DOB: 1951-04-02 Today's Date: 12/14/2020    History of Present Illness Steven Ingram is a 51yoM who comes [facility] on 8[facility] s/p fall at home. WIfe provides care for patient but is on HD TTS. Pt followed by Palliative Care as an outpaient. PM: dementia, DM, CVAx2 (June 2020, April 2021), HTN, HLD, protein/calorie malnutrition, CKD3, CAD, dCHF.    PT Comments    Pt was long sitting in bed upon arriving. He is asleep but easily awakes and is conversational throughout however poor cognition greatly limits session progression. He inconsistently follows simple commands but with increased time was able to perform desired task. He stood 3 x EOB. 1 x with mod assist then 2 x max assist. RN in room at conclusion of session. Pt was easily able to reposition to [facility] once returned to supine to slide himself to [facility]. Overall pt tolerated session well. Will need extensive PT going forward to improve independence with ADLs.    Follow Up Recommendations  SNF     Equipment Recommendations  Other (comment) (defer to next level of care)       Precautions / Restrictions Precautions Precautions: Fall Restrictions Weight Bearing Restrictions: No    Mobility  Bed Mobility Overal bed mobility: Needs Assistance Bed Mobility: Supine to Sit;Sit to Supine     Supine to sit: Mod assist;Max assist Sit to supine: Total assist   General bed mobility comments: mod-max assist to achieve EOB sitting. total assist to return to supine    Transfers Overall transfer level: Needs assistance Equipment used: Rolling walker (2 wheeled) Transfers: Sit to/from Stand Sit to Stand: Max assist;From elevated surface         General transfer comment: pt stood 3 x EOB to RW with max assist + vcs. when pt participates in transfer, stood with mod assist. max assist when poor effort given.  Ambulation/Gait    General Gait Details: unsafe at  this time     Balance Overall balance assessment: Needs assistance Sitting-balance support: Bilateral upper extremity supported Sitting balance-Leahy Scale: Poor     Standing balance support: Bilateral upper extremity supported;During functional activity Standing balance-Leahy Scale: Poor Standing balance comment: needs constant assistance to maintain standing      Cognition Arousal/Alertness: Awake/alert Behavior During Therapy: Flat affect Overall Cognitive Status: History of cognitive impairments - at baseline    General Comments: oriented to self only; was able to follow one-step with increased time and constant vcs             Pertinent Vitals/Pain Pain Assessment: No/denies pain     PT Goals (current goals can now be found in the care plan section) Acute Rehab PT Goals Patient Stated Goal: none stated Progress towards PT goals: Progressing toward goals    Frequency    Min 2X/week      PT Plan Discharge plan needs to be updated       AM-PAC PT "6 Clicks" Mobility   Outcome Measure  Help needed turning from your back to your side while in a flat bed without using bedrails?: A Lot Help needed moving from lying on your back to sitting on the side of a flat bed without using bedrails?: A Lot Help needed moving to and from a bed to a chair (including a wheelchair)?: A Lot Help needed standing up from a chair using your arms (e.g., wheelchair or bedside chair)?: A Lot Help needed to walk in hospital  room?: Total Help needed climbing 3-5 steps with a railing? : Total 6 Click Score: 10    End of Session Equipment Utilized During Treatment: Gait belt Activity Tolerance: Patient tolerated treatment well;Patient limited by fatigue Patient left: in bed;with call bell/phone within reach;with bed alarm set;with family/visitor present Nurse Communication: Mobility status PT Visit Diagnosis: Difficulty in walking, not elsewhere classified (R26.2);Other abnormalities  of gait and mobility (R26.89);Apraxia (R48.2);Muscle weakness (generalized) (M62.81)     Time: [id number] PT Time Calculation (min) (ACUTE ONLY): 18 min  Charges:  $Therapeutic Activity: 8-22 mins                     Julaine Fusi PTA 12/14/20, 4:50 PM

## 2020-12-14 NOTE — Progress Notes (Signed)
Daily Progress Note   Patient Name: Steven Ingram       Date: 12/14/2020 DOB: [birth year]  Age: 70 y.o. MRN#: [id number] Attending Physician: Edwin Dada, * Primary Care Physician: [place] Date: 12/11/2020  Reason for Consultation/Follow-up: Establishing goals of care  Patient Profile/HPI:  Subjective: Met at bedside with patient's spouse- [name].  [name] shares that [name] has good days and bad days. Some days he can walk, and some days he cannot.  He is able to start a meal feeding himself, but then forgets and she has to assist him.  We discussed vascular dementia and its overall trajectory. Also discussed how acute hospitalizations and illnesses can affect the progression of dementia.  [name] shared that patient would not consider his current state a good quality of life. If he had a life threatening acute illness she would choose comfort measures.  MOST form was reviewed and completed electronically in [facility]-    Cardiopulmonary Resuscitation: Do Not Attempt Resuscitation (DNR/No CPR)  Medical Interventions: Comfort Measures: Keep clean, warm, and dry. Use medication by any route, positioning, wound care, and other measures to relieve pain and suffering. Use oxygen, suction and manual treatment of airway obstruction as needed for comfort. Do not transfer to the hospital unless comfort needs cannot be met in current location.  Antibiotics: Determine use of limitation of antibiotics when infection occurs  IV Fluids: IV fluids for a defined trial period  Feeding Tube: No feeding tube     Review of Systems  Unable to perform ROS: Dementia    Physical Exam Vitals reviewed.  Cardiovascular:     Rate and Rhythm: Bradycardia present.  Pulmonary:      Effort: Pulmonary effort is normal.  Neurological:     Mental Status: He is alert.     Comments: Oriented to self, otherwise pleasantly confused            Vital Signs: BP (!) 150/106 (BP Location: Left Arm)   Pulse (!) 44   Temp 98.6 F (37 C) (Oral)   Resp 18   Ht _0  (1.753 m)   Wt 75.3 kg   SpO2 100%   BMI 24.51 kg/m  SpO2: SpO2: 100 % O2 Device: O2 Device: Room Air O2 Flow Rate:    Intake/output summary:  Intake/Output Summary (  Last 24 hours) at 12/14/2020 1137 Last data filed at 12/14/2020 0500 Gross per 24 hour  Intake 3750.82 ml  Output --  Net 3750.82 ml   LBM: Last BM Date:  (unknown) Baseline Weight: Weight: 87 kg Most recent weight: Weight: 75.3 kg       Palliative Assessment/Data: PPS: 50%      Assessment/Recommendations/Plan  Dementia- moderate- progressing- MOST form completed GOC- continue current care- hopeful for patient to d/c to SNF- he needs care at home and PCP is encouraging [facility]- spouse reports she has not yet applied- but I do believe [facility] would provide much needed support at home- [name] attends dialysis and he needs to be supervised when she is not home Patient is current with community Palliative- [facility]- please continue to follow at discharge  Code Status: DNR  Prognosis:  Unable to determine  Discharge Planning: [place] for rehab with Palliative care service follow-up  Care plan was discussed with patient's spouse.  Thank you for allowing the Palliative Medicine Team to assist in the care of this patient.  Total time: 46 mins Prolonged billing: No     Greater than 50%  of this time was spent counseling and coordinating care related to the above assessment and plan.  Mariana Kaufman, AGNP-C Palliative Medicine   Please contact Palliative Medicine Team phone at [phone] for questions and concerns.

## 2020-12-14 NOTE — Progress Notes (Signed)
[place] [facility] PROGRESS NOTE    Steven Ingram  [id number] DOB: Nov 07, 1950 DOA: 12/11/2020 PCP: [facility]      Brief Narrative:  Steven Ingram is a 70 y.o. 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  In the ER, creatinine up from baseline 1.5, CT head unremarkable, chest x-ray clear.    Assessment & Plan:  AKI on CKD IIIa due to dehydration Baseline Cr 1.4, here was >2 mg/dL the night of admission, improved now to baseline with fluids and holding ACEi.   - Hold Jardiance - Resume lisinopril - Oral fluids at this point     Dementia Severe protein calorie malnutrition Patient has lost 9 kg, greater than 10% of his body weight since April.  This is likely driven by his dementia progressing. - Continue Glucerna - Dietitian consult - Consult palliative care - We would advocate for rehab services after discharge, to maximize the patient's quality of life  -Continue sertraline and Risperdal  Vascular disease Hypertension Chronic diastolic CHF Blood pressure elevated. Appears euvolemic. - Continue Plavix and atorvastatin -Continue metoprolol - Resume lisinopril -Hold Jardiance  Diabetes Glucose is normal - Continue sliding scale corrections -Hold Jardiance  Hypokalemia - Supplement potassium  BPH - Continue Flomax          Disposition: Status is: Inpatient  Remains inpatient appropriate because:Unsafe d/c plan  Dispo: The patient is from: Home              Anticipated d/c is to: SNF              Patient currently is medically stable to d/c.   Difficult to place patient No   Patient was admitted with acute kidney injury, dehydration.  His renal functions improved, he appears close to baseline, but is significantly weaker than his baseline  and we feel that his quality of life will be improved with limited physical therapy and skilled nursing for short-term rehab    Level of care: Med-Surg       MDM: The below labs and imaging reports were reviewed and summarized above.  Medication management as above.     DVT prophylaxis: heparin injection 5,000 Units Start: 12/12/20 0000  Code Status: DNR Family Communication: wife by phone         Subjective: No headache, chest pain, abdominal pain, dyspnea, fever.  Objective: Vitals:   12/13/20 2002 12/14/20 0035 12/14/20 0459 12/14/20 0803  BP: (!) 159/69 (!) 148/88 (!) 166/81 (!) 150/106  Pulse: 78 97 80 (!) 44  Resp: '18 18 18 18  '$ Temp: 97.8 F (36.6 C) 98.4 F (36.9 C) 98.4 F (36.9 C) 98.6 F (37 C)  TempSrc: Oral Oral Oral Oral  SpO2: 97% 100% 97% 100%  Weight:      Height:        Intake/Output Summary (Last 24 hours) at 12/14/2020 1506 Last data filed at 12/14/2020 1401 Gross per 24 hour  Intake 877 ml  Output --  Net 877 ml   Filed Weights   12/11/20 1516 12/13/20 1710  Weight: 87 kg 75.3 kg    Examination: General appearance: Thin elderly adult male, alert and in no acute distress.   HEENT: Anicteric, conjunctiva pink, lids and lashes normal. No nasal deformity, discharge, epistaxis.  Lips moist.  Skin: Warm and dry.  no jaundice.  No suspicious rashes or lesions. Cardiac: RRR, nl S1-S2, no murmurs appreciated.  Capillary refill is brisk.  No lower extremity edema. Respiratory: Normal respiratory rate and rhythm.  CTAB without rales or wheezes. Abdomen: Abdomen soft.  no TTP. No ascites, distension, hepatosplenomegaly.   MSK: No deformities or effusions.  Diffuse loss of subcutaneous muscle mass and fat Neuro: Awake and alert.  EOMI, moves all extremities. Speech fluent.    Psych: Sensorium intact and responding to questions, attention normal. Affect blunted.  Judgment and insight appear .    Data Reviewed: I have personally reviewed  following labs and imaging studies:  CBC: Recent Labs  Lab 12/11/20 1528 12/12/20 0737 12/13/20 0552 12/14/20 0519  WBC 9.4 7.1 5.6 5.1  NEUTROABS 6.3  --   --   --   HGB 11.8* 9.6* 9.4* 10.6*  HCT 36.2* 28.9* 27.5* 31.1*  MCV 100.6* 99.7 98.9 98.4  PLT 242 186 181 [id number]   Basic Metabolic Panel: Recent Labs  Lab 12/11/20 1528 12/12/20 0737 12/13/20 0552 12/14/20 0519  NA 143 140 143 142  K 3.8 3.8 3.1* 3.2*  CL 110 103 100 101  CO2 23 30 35* 34*  GLUCOSE 181* 236* 159* 207*  BUN 21 32* 24* 20  CREATININE 1.92* 2.32* 1.63* 1.43*  CALCIUM 8.8* 8.2* 8.0* 8.1*  MG  --   --  2.0  --    GFR: Estimated Creatinine Clearance: 47.4 mL/min (A) (by C-G formula based on SCr of 1.43 mg/dL (H)). Liver Function Tests: Recent Labs  Lab 12/11/20 1528 12/12/20 0737  AST 27 18  ALT 17 17  ALKPHOS 43 35*  BILITOT 1.4* 0.9  PROT 6.0* 5.3*  ALBUMIN 2.9* 2.5*   No results for input(s): LIPASE, AMYLASE in the last 168 hours. No results for input(s): AMMONIA in the last 168 hours. Coagulation Profile: Recent Labs  Lab 12/11/20 1528  INR 1.1   Cardiac Enzymes: No results for input(s): CKTOTAL, CKMB, CKMBINDEX, TROPONINI in the last 168 hours. BNP (last 3 results) No results for input(s): PROBNP in the last 8760 hours. HbA1C: Recent Labs    12/12/20 0737  HGBA1C 5.7*   CBG: Recent Labs  Lab 12/13/20 1156 12/13/20 1630 12/13/20 2153 12/14/20 0804 12/14/20 1127  GLUCAP 170* 162* 121* 160* 215*   Lipid Profile: No results for input(s): CHOL, HDL, LDLCALC, TRIG, CHOLHDL, LDLDIRECT in the last 72 hours. Thyroid Function Tests: No results for input(s): TSH, T4TOTAL, FREET4, T3FREE, THYROIDAB in the last 72 hours. Anemia Panel: No results for input(s): VITAMINB12, FOLATE, FERRITIN, TIBC, IRON, RETICCTPCT in the last 72 hours. Urine analysis:    Component Value Date/Time   COLORURINE AMBER (A) 12/11/2020 2217   APPEARANCEUR CLOUDY (A) 12/11/2020 2217   LABSPEC 1.023  12/11/2020 2217   PHURINE 5.0 12/11/2020 2217   GLUCOSEU >=500 (A) 12/11/2020 2217   HGBUR NEGATIVE 12/11/2020 2217   [place] NEGATIVE 12/11/2020 2217   KETONESUR NEGATIVE 12/11/2020 2217   PROTEINUR >=300 (A) 12/11/2020 2217   NITRITE NEGATIVE 12/11/2020 2217   LEUKOCYTESUR NEGATIVE 12/11/2020 2217   Sepsis Labs: '@LABRCNTIP'$ (procalcitonin:4,lacticacidven:4)  ) Recent Results (from the past 240 hour(s))  Resp Panel by RT-PCR (Flu A&B, Covid) Nasopharyngeal Swab     Status: None   Collection Time: 12/11/20 10:17 PM   Specimen: Nasopharyngeal Swab; Nasopharyngeal(NP) swabs in vial transport medium  Result Value Ref Range Status   SARS Coronavirus 2 by RT PCR NEGATIVE NEGATIVE Final  Comment: (NOTE) SARS-CoV-2 target nucleic acids are NOT DETECTED.  The SARS-CoV-2 RNA is generally detectable in upper respiratory specimens during the acute phase of infection. The lowest concentration of SARS-CoV-2 viral copies this assay can detect is 138 copies/mL. A negative result does not preclude SARS-Cov-2 infection and should not be used as the sole basis for treatment or other patient management decisions. A negative result may occur with  improper specimen collection/handling, submission of specimen other than nasopharyngeal swab, presence of viral mutation(s) within the areas targeted by this assay, and inadequate number of viral copies(<138 copies/mL). A negative result must be combined with clinical observations, patient history, and epidemiological information. The expected result is Negative.  Fact Sheet for Patients:  EntrepreneurPulse.com.au  Fact Sheet for Healthcare Providers:  IncredibleEmployment.be  This test is no t yet approved or cleared by the [place] FDA and  has been authorized for detection and/or diagnosis of SARS-CoV-2 by FDA under an Emergency Use Authorization (EUA). This EUA will remain  in effect (meaning this  test can be used) for the duration of the COVID-19 declaration under Section 564(b)(1) of the Act, 21 U.S.C.section 360bbb-3(b)(1), unless the authorization is terminated  or revoked sooner.       Influenza A by PCR NEGATIVE NEGATIVE Final   Influenza B by PCR NEGATIVE NEGATIVE Final    Comment: (NOTE) The Xpert Xpress SARS-CoV-2/FLU/RSV plus assay is intended as an aid in the diagnosis of influenza from Nasopharyngeal swab specimens and should not be used as a sole basis for treatment. Nasal washings and aspirates are unacceptable for Xpert Xpress SARS-CoV-2/FLU/RSV testing.  Fact Sheet for Patients: EntrepreneurPulse.com.au  Fact Sheet for Healthcare Providers: IncredibleEmployment.be  This test is not yet approved or cleared by the [place] FDA and has been authorized for detection and/or diagnosis of SARS-CoV-2 by FDA under an Emergency Use Authorization (EUA). This EUA will remain in effect (meaning this test can be used) for the duration of the COVID-19 declaration under Section 564(b)(1) of the Act, 21 U.S.C. section 360bbb-3(b)(1), unless the authorization is terminated or revoked.  Performed at [facility], [address], [place], [place] [address]          Radiology Studies: No results found.      Scheduled Meds:  atorvastatin  80 mg Oral q1800   clopidogrel  75 mg Oral Daily   feeding supplement (GLUCERNA SHAKE)  237 mL Oral TID BM   heparin  5,000 Units Subcutaneous Q8H   insulin aspart  0-5 Units Subcutaneous QHS   insulin aspart  0-9 Units Subcutaneous TID WC   metoprolol tartrate  50 mg Oral Daily   potassium chloride  40 mEq Oral BID   risperiDONE  0.25 mg Oral Daily   sertraline  25 mg Oral Daily   tamsulosin  0.4 mg Oral BID   Continuous Infusions:   LOS: 3 days    Time spent: 25 minutes    Edwin Dada, MD [facility] 12/14/2020, 3:06 PM     Please page  though [place] or Epic secure chat:  For [employer], Adult nurse

## 2020-12-14 NOTE — TOC Progression Note (Signed)
Transition of Care [facility]) - Progression Note    Patient Details  Name: Steven Ingram MRN: [id number] Date of Birth: 1950/11/09  Transition of Care [facility]) CM/SW [place], RN Phone Number: 12/14/2020, 1:51 PM  Clinical Narrative: Attempted to call family to discuss discharge plans no answer LVM on one phone the other two lines voices message full.    Expected Discharge Plan: [place] Barriers to Discharge: Continued Medical Work up  Expected Discharge Plan and Services Expected Discharge Plan: [place]   Discharge Planning Services: CM Consult Post Acute Care Choice: [place] Living arrangements for the past 2 months: Single Family Home                 DME Arranged: N/A DME Agency: NA       HH Arranged: NA           Social Determinants of Health (SDOH) Interventions    Readmission Risk Interventions Readmission Risk Prevention Plan 12/13/2020 10/04/2018  Post Dischage Appt - Not Complete  Medication Screening - Complete  Transportation Screening Complete Complete  PCP or Specialist Appt within 3-5 Days Complete -  HRI or Home Care Consult Complete -  Social Work Consult for [place] Planning/Counseling Complete -  Palliative Care Screening Complete -  Medication Review Press photographer) Complete -

## 2020-12-15 LAB — RESP PANEL BY RT-PCR (FLU A&B, COVID) ARPGX2
Influenza A by PCR: NEGATIVE
Influenza B by PCR: NEGATIVE
SARS Coronavirus 2 by RT PCR: NEGATIVE

## 2020-12-15 LAB — GLUCOSE, CAPILLARY
Glucose-Capillary: 154 mg/dL — ABNORMAL HIGH (ref 70–99)
Glucose-Capillary: 251 mg/dL — ABNORMAL HIGH (ref 70–99)
Glucose-Capillary: 178 mg/dL — ABNORMAL HIGH (ref 70–99)
Glucose-Capillary: 228 mg/dL — ABNORMAL HIGH (ref 70–99)

## 2020-12-15 MED ORDER — HYDRALAZINE HCL 20 MG/ML IJ SOLN
10.0000 mg | Freq: Once | INTRAMUSCULAR | Status: AC
  Administered 2020-12-15: 05:00:00 10 mg via INTRAVENOUS
  Filled 2020-12-15: qty 1

## 2020-12-15 MED ORDER — ENOXAPARIN SODIUM 40 MG/0.4ML IJ SOSY
40.0000 mg | PREFILLED_SYRINGE | INTRAMUSCULAR | Status: DC
  Administered 2020-12-15: 21:00:00 40 mg via SUBCUTANEOUS
  Filled 2020-12-15: qty 0.4

## 2020-12-15 MED ORDER — METOPROLOL TARTRATE 5 MG/5ML IV SOLN
5.0000 mg | Freq: Once | INTRAVENOUS | Status: AC
  Administered 2020-12-15: 02:00:00 5 mg via INTRAVENOUS
  Filled 2020-12-15: qty 5

## 2020-12-15 NOTE — TOC Progression Note (Signed)
Transition of Care [facility]) - Progression Note    Patient Details  Name: Steven Ingram MRN: [id number] Date of Birth: 03/26/1951  Transition of Care [facility]) CM/SW Contact  Shelbie Hutching, RN Phone Number: 12/15/2020, 12:09 PM  Clinical Narrative:    RNCM was able to speak with patient's daughter, Steven Ingram, [phone].  Crystal reports that her mother will probably want the patient to go back to [facility] if they can offer a bed.  Reached out to [place] at [facility] to see if they can offer.     Expected Discharge Plan: [place] Barriers to Discharge: Continued Medical Work up  Expected Discharge Plan and Services Expected Discharge Plan: [place]   Discharge Planning Services: CM Consult Post Acute Care Choice: [place] Living arrangements for the past 2 months: Single Family Home                 DME Arranged: N/A DME Agency: NA       HH Arranged: NA           Social Determinants of Health (SDOH) Interventions    Readmission Risk Interventions Readmission Risk Prevention Plan 12/13/2020 10/04/2018  Post Dischage Appt - Not Complete  Medication Screening - Complete  Transportation Screening Complete Complete  PCP or Specialist Appt within 3-5 Days Complete -  HRI or Home Care Consult Complete -  Social Work Consult for [place] Planning/Counseling Complete -  Palliative Care Screening Complete -  Medication Review Press photographer) Complete -

## 2020-12-15 NOTE — TOC Progression Note (Signed)
Transition of Care [facility]) - Progression Note    Patient Details  Name: Steven Ingram MRN: [id number] Date of Birth: [birth year]  Transition of Care [facility]) CM/SW Contact  Shelbie Hutching, RN Phone Number: 12/15/2020, 2:26 PM  Clinical Narrative:    RNCM had to call Barrie Dunker to confirm that patient is [name].  RNCM started insurance authorization.    Expected Discharge Plan: [place] Barriers to Discharge: Continued Medical Work up  Expected Discharge Plan and Services Expected Discharge Plan: [place]   Discharge Planning Services: CM Consult Post Acute Care Choice: [place] Living arrangements for the past 2 months: Single Family Home                 DME Arranged: N/A DME Agency: NA       HH Arranged: NA           Social Determinants of Health (SDOH) Interventions    Readmission Risk Interventions Readmission Risk Prevention Plan 12/13/2020 10/04/2018  Post Dischage Appt - Not Complete  Medication Screening - Complete  Transportation Screening Complete Complete  PCP or Specialist Appt within 3-5 Days Complete -  HRI or Home Care Consult Complete -  Social Work Consult for [place] Planning/Counseling Complete -  Palliative Care Screening Complete -  Medication Review Press photographer) Complete -

## 2020-12-15 NOTE — Progress Notes (Signed)
Occupational Therapy Treatment Patient Details Name: Steven Ingram MRN: [id number] DOB: [birth year] Today's Date: 12/15/2020    History of present illness Steven Ingram is a 13yoM who comes [facility] on 8/[facility] s/p fall at home. [facility] provides care for patient but is on HD TTS. Pt followed by Palliative Care as an outpaient. PM: dementia, DM, CVAx2 (June 2020, April 2021), HTN, HLD, protein/calorie malnutrition, CKD3, CAD, dCHF.   OT comments  Steven Ingram received sitting in bed, leaning to left side. He is pleasant and able to engage in conversation, but demonstrates poor cognition: oriented to self only, unable to remember the names of his wife or children, unaware of where he is. He requires Max A for bed mobility but is able to maintain sitting balance EOB and engage in UE therex, requiring hands-on assistance for all exercises. He is unable to mimic therapist's movement without physical A. Pt able to assist minimally in repositioning towards HOB from supine. Will continue to follow POC; DC recs remain appropriate.    Follow Up Recommendations  SNF;Supervision/Assistance - 24 hour    Equipment Recommendations       Recommendations for Other Services      Precautions / Restrictions Precautions Precautions: Fall Restrictions Weight Bearing Restrictions: No       Mobility Bed Mobility Overal bed mobility: Needs Assistance Bed Mobility: Supine to Sit;Sit to Supine     Supine to sit: Max assist Sit to supine: Max assist;Total assist   General bed mobility comments: Max A for bed mobility, Total A for elevating L LE to return to supine    Transfers                      Balance Overall balance assessment: Needs assistance Sitting-balance support: No upper extremity supported;Feet supported Sitting balance-Leahy Scale: Good Sitting balance - Comments: able to maintain sitting balance EOB while performing UE therex, x 5 minutes                                    ADL either performed or assessed with clinical judgement   ADL Overall ADL's : Needs assistance/impaired Eating/Feeding: Set up;Supervision/ safety                                           Vision       Perception     Praxis      Cognition Arousal/Alertness: Awake/alert Behavior During Therapy: Flat affect Overall Cognitive Status: History of cognitive impairments - at baseline                                          Exercises Other Exercises Other Exercises: bed mobility, sitting balance tolerance, UE therex   Shoulder Instructions       General Comments      Pertinent Vitals/ Pain       Pain Assessment: No/denies pain  Home Living                                          Prior Functioning/Environment  Frequency  Min 1X/week        Progress Toward Goals  OT Goals(current goals can now be found in the care plan section)  Progress towards OT goals: Progressing toward goals  Acute Rehab OT Goals Patient Stated Goal: none stated OT Goal Formulation: Patient unable to participate in goal setting Time For Goal Achievement: 12/26/20 Potential to Achieve Goals: Good  Plan Discharge plan remains appropriate;Frequency remains appropriate    Co-evaluation                 AM-PAC OT "6 Clicks" Daily Activity     Outcome Measure   Help from another person eating meals?: A Little Help from another person taking care of personal grooming?: A Lot Help from another person toileting, which includes using toliet, bedpan, or urinal?: A Lot Help from another person bathing (including washing, rinsing, drying)?: A Lot Help from another person to put on and taking off regular upper body clothing?: A Lot Help from another person to put on and taking off regular lower body clothing?: A Lot 6 Click Score: 13    End of Session    OT Visit Diagnosis: Other abnormalities of gait and  mobility (R26.89);Muscle weakness (generalized) (M62.81);Adult, failure to thrive (R62.7)   Activity Tolerance Patient limited by lethargy   Patient Left in bed;with call bell/phone within reach;with bed alarm set   Nurse Communication          Time: [id number] OT Time Calculation (min): 15 min  Charges: OT General Charges $OT Visit: 1 Visit OT Treatments $Self Care/Home Management : 8-22 mins  Josiah Lobo, PhD, MS, OTR/L 12/15/20, 11:01 AM

## 2020-12-15 NOTE — Care Management Important Message (Signed)
Important Message  Patient Details  Name: Steven Ingram MRN: [id number] Date of Birth: [birth year]   Medicare Important Message Given:  Yes  I talked with the daughter, [name] by phone [phone]) as the [facility] has been discussing discharge plans with her. I reviewed the Important Message from Medicare with her and she is in agreement with the discharge plan.  I asked if she would like a copy of the form but she said it wasn't necessary.  I wished her father well and thanked her for her time.  [name] 12/15/2020, 3:19 PM

## 2020-12-15 NOTE — Progress Notes (Signed)
[place] [facility] PROGRESS NOTE    Steven Ingram  [id number] DOB: [birth year] DOA: 12/11/2020 PCP: [facility] [employer]      Brief Narrative:  Steven Ingram is a 70 y.o. 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  In the ER, creatinine up from baseline 1.5, CT head unremarkable, chest x-ray clear.      Assessment & Plan:  AKI on CKD IIIa due to dehydration Baseline Cr 1.4, here was >2 mg/dL the night of admission, improved now to baseline with fluids and holding ACEi.   -Continue lisinopril - Hold Jardiance - Repeat BMP tomorrow     Dementia Severe protein calorie malnutrition Patient has lost 9 kg, greater than 10% of his body weight since April.  This is likely driven by his dementia progressing. -Continue Glucerna - Dietitian consult - Consult palliative care   - We would advocate for rehab services after discharge, to maximize the patient's quality of life  -Continue sertraline and Risperdal    Vascular disease Hypertension Chronic diastolic CHF Euvolemic.  Blood pressure still elevated. - Continue Plavix, atorvastatin, metoprolol, lisinopril - Hold Jardiance   Diabetes Glucose controlled -Continue sliding scale insulin corrections -Hold Jardiance  Hypokalemia Supplemented -Repeat BMP tomorrow  BPH -Continue Flomax          Disposition: Status is: Inpatient  Remains inpatient appropriate because:Unsafe d/c plan  Dispo: The patient is from: Home              Anticipated d/c is to: SNF              Patient currently is medically stable to d/c.   Difficult to place patient No   Patient was admitted with acute kidney injury, dehydration.  His renal function has improved, he appears close to baseline Cr and metntatin now as well, but is  significantly weaker than his baseline and we feel that his quality of life will be improved with limited physical therapy and skilled nursing for short-term rehab    Level of care: Med-Surg       MDM: The below labs and imaging reports were reviewed and summarized above.  Medication management as above.     DVT prophylaxis: enoxaparin (LOVENOX) injection 40 mg Start: 12/15/20 2200  Code Status: DNR Family Communication: daughter at bedside         Subjective: No new fever, respiratory distress, headache, chest pain, abdominal pain, swelling.     Objective: Vitals:   12/15/20 0004 12/15/20 0412 12/15/20 0515 12/15/20 0813  BP: (!) 174/95 (!) 178/99 (!) 178/99 (!) 177/78  Pulse: 92 74  65  Resp: '18 16  17  '$ Temp: 98.1 F (36.7 C) (!) 97.2 F (36.2 C)  (!) 97.4 F (36.3 C)  TempSrc:    Oral  SpO2: 100% 100%  100%  Weight:      Height:        Intake/Output Summary (Last 24 hours) at 12/15/2020 1451 Last data filed at 12/15/2020 1315 Gross per 24 hour  Intake 637 ml  Output 450 ml  Net 187 ml   Filed Weights   12/11/20 1516 12/13/20 1710  Weight: 87 kg 75.3 kg    Examination: General appearance: Elderly adult male, lying in bed, sleeping, easily arousable     HEENT:  Skin: Warm and dry, no suspicious rashes or lesions Cardiac: Regular rate and rhythm, no murmurs, no lower extremity edema the left arm is somewhat swollen due to lying on it, no tenderness or redness Respiratory: Normal respiratory rate and rhythm, lungs clear without rales or wheezes Abdomen: Abdomen soft without tenderness palpation or guarding, no ascites or distention MSK: Thenar wasting, diffuse loss of subcutaneous muscle mass and fat Neuro: Sleeping but easily arousable, moves upper extremities with severe generalized weakness, speech fluent, face symmetric, extraocular movements intact Psych: Sensorium intact response to questions, makes eye contact, attention normal, affect blunted,  judgment insight appear somewhat impaired      Data Reviewed: I have personally reviewed following labs and imaging studies:  CBC: Recent Labs  Lab 12/11/20 1528 12/12/20 0737 12/13/20 0552 12/14/20 0519  WBC 9.4 7.1 5.6 5.1  NEUTROABS 6.3  --   --   --   HGB 11.8* 9.6* 9.4* 10.6*  HCT 36.2* 28.9* 27.5* 31.1*  MCV 100.6* 99.7 98.9 98.4  PLT 242 186 181 [id number]   Basic Metabolic Panel: Recent Labs  Lab 12/11/20 1528 12/12/20 0737 12/13/20 0552 12/14/20 0519  NA 143 140 143 142  K 3.8 3.8 3.1* 3.2*  CL 110 103 100 101  CO2 23 30 35* 34*  GLUCOSE 181* 236* 159* 207*  BUN 21 32* 24* 20  CREATININE 1.92* 2.32* 1.63* 1.43*  CALCIUM 8.8* 8.2* 8.0* 8.1*  MG  --   --  2.0  --    GFR: Estimated Creatinine Clearance: 47.4 mL/min (A) (by C-G formula based on SCr of 1.43 mg/dL (H)). Liver Function Tests: Recent Labs  Lab 12/11/20 1528 12/12/20 0737  AST 27 18  ALT 17 17  ALKPHOS 43 35*  BILITOT 1.4* 0.9  PROT 6.0* 5.3*  ALBUMIN 2.9* 2.5*   No results for input(s): LIPASE, AMYLASE in the last 168 hours. No results for input(s): AMMONIA in the last 168 hours. Coagulation Profile: Recent Labs  Lab 12/11/20 1528  INR 1.1   Cardiac Enzymes: No results for input(s): CKTOTAL, CKMB, CKMBINDEX, TROPONINI in the last 168 hours. BNP (last 3 results) No results for input(s): PROBNP in the last 8760 hours. HbA1C: No results for input(s): HGBA1C in the last 72 hours.  CBG: Recent Labs  Lab 12/14/20 0804 12/14/20 1127 12/14/20 2130 12/15/20 0814 12/15/20 1156  GLUCAP 160* 215* 172* 154* 251*   Lipid Profile: No results for input(s): CHOL, HDL, LDLCALC, TRIG, CHOLHDL, LDLDIRECT in the last 72 hours. Thyroid Function Tests: No results for input(s): TSH, T4TOTAL, FREET4, T3FREE, THYROIDAB in the last 72 hours. Anemia Panel: No results for input(s): VITAMINB12, FOLATE, FERRITIN, TIBC, IRON, RETICCTPCT in the last 72 hours. Urine analysis:    Component Value Date/Time    COLORURINE AMBER (A) 12/11/2020 2217   APPEARANCEUR CLOUDY (A) 12/11/2020 2217   LABSPEC 1.023 12/11/2020 2217   PHURINE 5.0 12/11/2020 2217   GLUCOSEU >=500 (A) 12/11/2020 2217   HGBUR NEGATIVE 12/11/2020 2217   [place] NEGATIVE 12/11/2020 2217   KETONESUR NEGATIVE 12/11/2020 2217   PROTEINUR >=300 (A) 12/11/2020 2217   NITRITE NEGATIVE 12/11/2020 2217   LEUKOCYTESUR NEGATIVE 12/11/2020 2217   Sepsis Labs: '@LABRCNTIP'$ (procalcitonin:4,lacticacidven:4)  ) Recent Results (from the past 240 hour(s))  Resp Panel by RT-PCR (Flu A&B, Covid) Nasopharyngeal Swab     Status: None   Collection Time: 12/11/20 10:17 PM   Specimen: Nasopharyngeal Swab; Nasopharyngeal(NP) swabs in vial transport medium  Result Value Ref Range Status   SARS Coronavirus  2 by RT PCR NEGATIVE NEGATIVE Final    Comment: (NOTE) SARS-CoV-2 target nucleic acids are NOT DETECTED.  The SARS-CoV-2 RNA is generally detectable in upper respiratory specimens during the acute phase of infection. The lowest concentration of SARS-CoV-2 viral copies this assay can detect is 138 copies/mL. A negative result does not preclude SARS-Cov-2 infection and should not be used as the sole basis for treatment or other patient management decisions. A negative result may occur with  improper specimen collection/handling, submission of specimen other than nasopharyngeal swab, presence of viral mutation(s) within the areas targeted by this assay, and inadequate number of viral copies(<138 copies/mL). A negative result must be combined with clinical observations, patient history, and epidemiological information. The expected result is Negative.  Fact Sheet for Patients:  EntrepreneurPulse.com.au  Fact Sheet for Healthcare Providers:  IncredibleEmployment.be  This test is no t yet approved or cleared by the [place] FDA and  has been authorized for detection and/or diagnosis of SARS-CoV-2  by FDA under an Emergency Use Authorization (EUA). This EUA will remain  in effect (meaning this test can be used) for the duration of the COVID-19 declaration under Section 564(b)(1) of the Act, 21 U.S.C.section 360bbb-3(b)(1), unless the authorization is terminated  or revoked sooner.       Influenza A by PCR NEGATIVE NEGATIVE Final   Influenza B by PCR NEGATIVE NEGATIVE Final    Comment: (NOTE) The Xpert Xpress SARS-CoV-2/FLU/RSV plus assay is intended as an aid in the diagnosis of influenza from Nasopharyngeal swab specimens and should not be used as a sole basis for treatment. Nasal washings and aspirates are unacceptable for Xpert Xpress SARS-CoV-2/FLU/RSV testing.  Fact Sheet for Patients: EntrepreneurPulse.com.au  Fact Sheet for Healthcare Providers: IncredibleEmployment.be  This test is not yet approved or cleared by the [place] FDA and has been authorized for detection and/or diagnosis of SARS-CoV-2 by FDA under an Emergency Use Authorization (EUA). This EUA will remain in effect (meaning this test can be used) for the duration of the COVID-19 declaration under Section 564(b)(1) of the Act, 21 U.S.C. section 360bbb-3(b)(1), unless the authorization is terminated or revoked.  Performed at [facility], [address] [place], [place] [address]          Radiology Studies: No results found.      Scheduled Meds:  atorvastatin  80 mg Oral q1800   clopidogrel  75 mg Oral Daily   enoxaparin (LOVENOX) injection  40 mg Subcutaneous Q24H   feeding supplement (GLUCERNA SHAKE)  237 mL Oral TID BM   insulin aspart  0-5 Units Subcutaneous QHS   insulin aspart  0-9 Units Subcutaneous TID WC   lisinopril  20 mg Oral Daily   metoprolol tartrate  50 mg Oral Daily   risperiDONE  0.25 mg Oral Daily   sertraline  25 mg Oral Daily   tamsulosin  0.4 mg Oral BID   Continuous Infusions:   LOS: 4 days    Time spent:  25  minutes    Edwin Dada, MD [facility] 12/15/2020, 2:51 PM     Please page though [place] or Epic secure chat:  For [employer], Adult nurse

## 2020-12-15 NOTE — Progress Notes (Signed)
Covid swab collected and sent to lab at this time.

## 2020-12-15 NOTE — TOC Progression Note (Signed)
Transition of Care [facility]) - Progression Note    Patient Details  Name: Kaya Haydock MRN: [id number] Date of Birth: 07-Jan-1951  Transition of Care [facility]) CM/SW Contact  Shelbie Hutching, RN Phone Number: 12/15/2020, 1:00 PM  Clinical Narrative:    [facility] offered a bed and they will start insurance auth.     Expected Discharge Plan: [place] Barriers to Discharge: Continued Medical Work up  Expected Discharge Plan and Services Expected Discharge Plan: [place]   Discharge Planning Services: CM Consult Post Acute Care Choice: [place] Living arrangements for the past 2 months: Single Family Home                 DME Arranged: N/A DME Agency: NA       HH Arranged: NA           Social Determinants of Health (SDOH) Interventions    Readmission Risk Interventions Readmission Risk Prevention Plan 12/13/2020 10/04/2018  Post Dischage Appt - Not Complete  Medication Screening - Complete  Transportation Screening Complete Complete  PCP or Specialist Appt within 3-5 Days Complete -  HRI or Home Care Consult Complete -  Social Work Consult for [place] Planning/Counseling Complete -  Palliative Care Screening Complete -  Medication Review Press photographer) Complete -

## 2020-12-16 LAB — BASIC METABOLIC PANEL
Anion gap: 6 (ref 5–15)
BUN: 19 mg/dL (ref 8–23)
CO2: 30 mmol/L (ref 22–32)
Calcium: 8.3 mg/dL — ABNORMAL LOW (ref 8.9–10.3)
Chloride: 101 mmol/L (ref 98–111)
Creatinine, Ser: 1.59 mg/dL — ABNORMAL HIGH (ref 0.61–1.24)
GFR, Estimated: 46 mL/min — ABNORMAL LOW (ref >60)
Glucose, Bld: 166 mg/dL — ABNORMAL HIGH (ref 70–99)
Potassium: 3.7 mmol/L (ref 3.5–5.1)
Sodium: 137 mmol/L (ref 135–145)

## 2020-12-16 LAB — CBC
HCT: 28.4 % — ABNORMAL LOW (ref 39.0–52.0)
Hemoglobin: 9.6 g/dL — ABNORMAL LOW (ref 13.0–17.0)
MCH: 32.7 pg (ref 26.0–34.0)
MCHC: 33.8 g/dL (ref 30.0–36.0)
MCV: 96.6 fL (ref 80.0–100.0)
Platelets: 207 10*3/uL (ref 150–400)
RBC: 2.94 MIL/uL — ABNORMAL LOW (ref 4.22–5.81)
RDW: 11.6 % (ref 11.5–15.5)
WBC: 4.8 10*3/uL (ref 4.0–10.5)
nRBC: 0 % (ref 0.0–0.2)

## 2020-12-16 LAB — GLUCOSE, CAPILLARY
Glucose-Capillary: 160 mg/dL — ABNORMAL HIGH (ref 70–99)
Glucose-Capillary: 145 mg/dL — ABNORMAL HIGH (ref 70–99)

## 2020-12-16 MED ORDER — LISINOPRIL 20 MG PO TABS: 20.0000 mg | ORAL_TABLET | Freq: Every day | ORAL | Status: DC

## 2020-12-16 MED ORDER — GLUCERNA SHAKE PO LIQD: 237.0000 mL | Freq: Three times a day (TID) | ORAL | 0 refills | Status: DC

## 2020-12-16 MED ORDER — CALCIUM CARBONATE ANTACID 1250 MG/5ML PO SUSP: 500.0000 mg | Freq: Four times a day (QID) | ORAL | Status: DC | PRN

## 2020-12-16 NOTE — Progress Notes (Signed)
Occupational Therapy Treatment Patient Details Name: Steven Ingram MRN: [id number] DOB: Jun 04, 1950 Today's Date: 12/16/2020    History of present illness Pt. is a 76yoM who comes to [facility] on 8/28 s/p fall at home. [facility] provides care for patient but is on HD TTS. Pt followed by Palliative Care as an outpaient. PM: dementia, DM, CVAx2 (June 2020, April 2021), HTN, HLD, protein/calorie malnutrition, CKD3, CAD, dCHF.   OT comments  Pt. is preparing for discharge to SNF today. Pt. required verbal, and tactile cues with increased time for delayed motor planning during functional reaching, and ROM exercises. Pt. Continues to benefit from OT services for ADL training, A/E training, there. Ex., and pt./caregiver education about cognitive compensatory strategies, overall ADL functioning. Pt. Continues to benefit from SNF level of care upon discharge, with follow-up OT services.    Follow Up Recommendations  SNF;Supervision/Assistance - 24 hour    Equipment Recommendations       Recommendations for Other Services      Precautions / Restrictions Precautions Precautions: Fall Restrictions Weight Bearing Restrictions: No       Mobility Bed Mobility Overal bed mobility: Needs Assistance Bed Mobility: Supine to Sit;Sit to Supine     Supine to sit: Max assist Sit to supine: Max assist        Transfers Overall transfer level: Needs assistance   Transfers: Sit to/from Stand Sit to Stand: Max assist;From elevated surface         General transfer comment: Max assist for STS from elevated bed height.    Balance                                           ADL either performed or assessed with clinical judgement   ADL Overall ADL's : Needs assistance/impaired                     Lower Body Dressing: Maximal assistance                       Vision       Perception     Praxis      Cognition Arousal/Alertness: Awake/alert Behavior During  Therapy: Flat affect Overall Cognitive Status: History of cognitive impairments - at baseline                                 General Comments: oriented to self only; was able to follow one-step with increased time and constant vcs        Exercises     Shoulder Instructions       General Comments      Pertinent Vitals/ Pain          Home Living                                          Prior Functioning/Environment              Frequency  Min 1X/week        Progress Toward Goals  OT Goals(current goals can now be found in the care plan section)  Progress towards OT goals: Progressing toward goals  Acute Rehab OT Goals Patient Stated Goal: none stated OT  Goal Formulation: Patient unable to participate in goal setting Time For Goal Achievement: 12/26/20 Potential to Achieve Goals: Good  Plan Discharge plan remains appropriate;Frequency remains appropriate    Co-evaluation                 AM-PAC OT "6 Clicks" Daily Activity     Outcome Measure   Help from another person eating meals?: None Help from another person taking care of personal grooming?: A Lot Help from another person toileting, which includes using toliet, bedpan, or urinal?: A Lot Help from another person bathing (including washing, rinsing, drying)?: A Lot Help from another person to put on and taking off regular upper body clothing?: A Lot Help from another person to put on and taking off regular lower body clothing?: A Lot 6 Click Score: 14    End of Session    OT Visit Diagnosis: Other abnormalities of gait and mobility (R26.89);Muscle weakness (generalized) (M62.81);Adult, failure to thrive (R62.7)   Activity Tolerance Patient limited by fatigue   Patient Left in bed;with call bell/phone within reach;with bed alarm set   Nurse Communication          Time: 1100-1115 OT Time Calculation (min): 15 min  Charges: OT General Charges $OT  Visit: 1 Visit OT Treatments $Self Care/Home Management : 8-22 mins Harrel Carina, MS, OTR/L    Harrel Carina 12/16/2020, 12:58 PM

## 2020-12-16 NOTE — TOC Transition Note (Signed)
Transition of Care [facility]) - CM/SW Discharge Note   Patient Details  Name: Steven Ingram MRN: [id number] Date of Birth: 27-Sep-1950  Transition of Care [facility]) CM/SW Contact:  Shelbie Hutching, RN Phone Number: 12/16/2020, 11:22 AM   Clinical Narrative:    Patient medically cleared for discharge to [facility] today.  Insurance authorization approved Josem Kaufmann ID: [id number] Reference # [id number] Approved 9/2-9/6  Attempted to notify patient's wife of discharge but VM is full.  Crystal patient daughter updated on discharge and asked to update her mother as well.  Patient will go to room 605B.  Bedside RN will call report.   Final next level of care: Skilled Nursing Facility Barriers to Discharge: Barriers Resolved   Patient Goals and CMS Choice Patient states their goals for this hospitalization and ongoing recovery are:: Family agrees with rehab CMS Medicare.gov Compare Post Acute Care list provided to:: Patient Represenative (must comment) Choice offered to / list presented to : Adult Children  Discharge Placement PASRR number recieved: 12/13/20            Patient chooses bed at: [facility] [place] Patient to be transferred to facility by: [facility] Name of family member notified: [name] and [employer] Patient and family notified of of transfer: 12/16/20  Discharge Plan and Services   Discharge Planning Services: CM Consult Post Acute Care Choice: [place]          DME Arranged: N/A DME Agency: NA       HH Arranged: NA          Social Determinants of Health (SDOH) Interventions     Readmission Risk Interventions Readmission Risk Prevention Plan 12/13/2020 10/04/2018  Post Dischage Appt - Not Complete  Medication Screening - Complete  Transportation Screening Complete Complete  PCP or Specialist Appt within 3-5 Days Complete -  HRI or Home Care Consult Complete -  Social Work Consult for Recovery Care Planning/Counseling Complete -  Palliative  Care Screening Complete -  Medication Review Press photographer) Complete -

## 2020-12-16 NOTE — TOC Progression Note (Signed)
Transition of Care [facility]) - Progression Note    Patient Details  Name: Shaen Hupe MRN: [id number] Date of Birth: Jan 22, 1951  Transition of Care [facility]) CM/SW Contact  Shelbie Hutching, RN Phone Number: 12/16/2020, 12:13 PM  Clinical Narrative:    Patient's wife did call this RNCM back and is in agreement with plan to DC to [facility] today.  RNCM has called for EMS transport.  EMS will transport once they have a truck available.     Expected Discharge Plan: [place] Barriers to Discharge: Barriers Resolved  Expected Discharge Plan and Services Expected Discharge Plan: [place]   Discharge Planning Services: CM Consult Post Acute Care Choice: [place] Living arrangements for the past 2 months: Single Family Home Expected Discharge Date: 12/16/20               DME Arranged: N/A DME Agency: NA       HH Arranged: NA           Social Determinants of Health (SDOH) Interventions    Readmission Risk Interventions Readmission Risk Prevention Plan 12/13/2020 10/04/2018  Post Dischage Appt - Not Complete  Medication Screening - Complete  Transportation Screening Complete Complete  PCP or Specialist Appt within 3-5 Days Complete -  HRI or Home Care Consult Complete -  Social Work Consult for Recovery Care Planning/Counseling Complete -  Palliative Care Screening Complete -  Medication Review Press photographer) Complete -

## 2020-12-16 NOTE — Progress Notes (Signed)
Report called to [employer] at [employer]. Pt to be transported by [facility]. Pt discharged in stable condition.

## 2020-12-16 NOTE — Plan of Care (Signed)
Problem: Education: Goal: Knowledge of General Education information will improve Description: Including pain rating scale, medication(s)/side effects and non-pharmacologic comfort measures 12/16/2020 1212 by Ronita Hipps, RN Outcome: Adequate for Discharge 12/16/2020 1212 by Ronita Hipps, RN Outcome: Adequate for Discharge   Problem: Health Behavior/Discharge Planning: Goal: Ability to manage health-related needs will improve 12/16/2020 1212 by Ronita Hipps, RN Outcome: Adequate for Discharge 12/16/2020 1212 by Ronita Hipps, RN Outcome: Adequate for Discharge   Problem: Clinical Measurements: Goal: Ability to maintain clinical measurements within normal limits will improve 12/16/2020 1212 by Ronita Hipps, RN Outcome: Adequate for Discharge 12/16/2020 1212 by Ronita Hipps, RN Outcome: Adequate for Discharge Goal: Will remain free from infection 12/16/2020 1212 by Ronita Hipps, RN Outcome: Adequate for Discharge 12/16/2020 1212 by Ronita Hipps, RN Outcome: Adequate for Discharge Goal: Diagnostic test results will improve 12/16/2020 1212 by Ronita Hipps, RN Outcome: Adequate for Discharge 12/16/2020 1212 by Ronita Hipps, RN Outcome: Adequate for Discharge Goal: Respiratory complications will improve 12/16/2020 1212 by Ronita Hipps, RN Outcome: Adequate for Discharge 12/16/2020 1212 by Ronita Hipps, RN Outcome: Adequate for Discharge Goal: Cardiovascular complication will be avoided 12/16/2020 1212 by Ronita Hipps, RN Outcome: Adequate for Discharge 12/16/2020 1212 by Ronita Hipps, RN Outcome: Adequate for Discharge   Problem: Activity: Goal: Risk for activity intolerance will decrease 12/16/2020 1212 by Ronita Hipps, RN Outcome: Adequate for Discharge 12/16/2020 1212 by Ronita Hipps, RN Outcome: Adequate for Discharge   Problem: Nutrition: Goal: Adequate nutrition will be  maintained 12/16/2020 1212 by Ronita Hipps, RN Outcome: Adequate for Discharge 12/16/2020 1212 by Ronita Hipps, RN Outcome: Adequate for Discharge   Problem: Coping: Goal: Level of anxiety will decrease 12/16/2020 1212 by Ronita Hipps, RN Outcome: Adequate for Discharge 12/16/2020 1212 by Ronita Hipps, RN Outcome: Adequate for Discharge   Problem: Elimination: Goal: Will not experience complications related to bowel motility 12/16/2020 1212 by Ronita Hipps, RN Outcome: Adequate for Discharge 12/16/2020 1212 by Ronita Hipps, RN Outcome: Adequate for Discharge Goal: Will not experience complications related to urinary retention 12/16/2020 1212 by Ronita Hipps, RN Outcome: Adequate for Discharge 12/16/2020 1212 by Ronita Hipps, RN Outcome: Adequate for Discharge   Problem: Pain Managment: Goal: General experience of comfort will improve 12/16/2020 1212 by Ronita Hipps, RN Outcome: Adequate for Discharge 12/16/2020 1212 by Ronita Hipps, RN Outcome: Adequate for Discharge   Problem: Safety: Goal: Ability to remain free from injury will improve 12/16/2020 1212 by Ronita Hipps, RN Outcome: Adequate for Discharge 12/16/2020 1212 by Ronita Hipps, RN Outcome: Adequate for Discharge   Problem: Skin Integrity: Goal: Risk for impaired skin integrity will decrease 12/16/2020 1212 by Ronita Hipps, RN Outcome: Adequate for Discharge 12/16/2020 1212 by Ronita Hipps, RN Outcome: Adequate for Discharge   Problem: Acute Rehab PT Goals(only PT should resolve) Goal: Pt Will Go Supine/Side To Sit Outcome: Adequate for Discharge Goal: Patient Will Perform Sitting Balance Outcome: Adequate for Discharge Goal: Pt Will Transfer Bed To Chair/Chair To Bed Outcome: Adequate for Discharge   Problem: Acute Rehab OT Goals (only OT should resolve) Goal: Pt. Will Perform Grooming Outcome: Adequate for Discharge Goal: Pt. Will  Perform Upper Body Dressing Outcome: Adequate for Discharge Goal: Pt. Will Transfer To Toilet Outcome: Adequate for Discharge   Problem: Inadequate Intake (NI-2.1) Goal: Food and/or nutrient delivery Description: Individualized approach for food/nutrient provision. Outcome:  Adequate for Discharge

## 2020-12-16 NOTE — TOC Progression Note (Signed)
Transition of Care [facility]) - Progression Note    Patient Details  Name: Jeyren Lynds MRN: [id number] Date of Birth: 17-Jan-1951  Transition of Care [facility]) CM/SW Contact  Shelbie Hutching, RN Phone Number: 12/16/2020, 9:42 AM  Clinical Narrative:    Mcarthur Rossetti is requesting a peer to peer for insurance authorization.  MD will call to complete Peer to peer before 1330 today by calling [phone] option 5.     Expected Discharge Plan: [place] Barriers to Discharge: Continued Medical Work up  Expected Discharge Plan and Services Expected Discharge Plan: [place]   Discharge Planning Services: CM Consult Post Acute Care Choice: [place] Living arrangements for the past 2 months: Single Family Home                 DME Arranged: N/A DME Agency: NA       HH Arranged: NA           Social Determinants of Health (SDOH) Interventions    Readmission Risk Interventions Readmission Risk Prevention Plan 12/13/2020 10/04/2018  Post Dischage Appt - Not Complete  Medication Screening - Complete  Transportation Screening Complete Complete  PCP or Specialist Appt within 3-5 Days Complete -  HRI or Home Care Consult Complete -  Social Work Consult for [place] Planning/Counseling Complete -  Palliative Care Screening Complete -  Medication Review Press photographer) Complete -

## 2020-12-16 NOTE — Discharge Summary (Signed)
Physician Discharge Summary  Steven Ingram [id number] DOB: 09-Oct-1950 DOA: 12/11/2020  PCP: [facility] [place] date: 12/11/2020 Discharge date: 12/16/2020  Admitted From: Home  Disposition:  SNF [facility]   Recommendations for Outpatient Follow-up:  Follow up with PCP 1 week after SNF discharge Check creatinine in 1 week Monitor blood sugar once daily and start sliding scale mealtime insulin if elevated significantly Consult Palliative Care to follow at [place]: None  Equipment/Devices: TBD at SNF  Discharge Condition: Fair  CODE STATUS: DNR Diet recommendation: Diabetic  Brief/Interim Summary: Mr. Steven Ingram is a 70 y.o. 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     In the ER, creatinine up from baseline 1.5, CT head unremarkable, chest x-ray clear.           PRINCIPAL HOSPITAL DIAGNOSIS: Acute kidney injury    Discharge Diagnoses:   AKI on CKD IIIa due to dehydration Baseline Cr 1.4, here was >2 mg/dL the night of admission.  ACEi and Jardiance held, IV fluids given, creatinine improved.  Lisinopril restarted, Cr stable.    Repeat BMP in 1 week     Dementia Severe protein calorie malnutrition Patient has lost 9 kg, greater than 10% of his body weight since April.  This is likely driven by his dementia progressing.  Nutrition supplements recommended.        Vascular disease Hypertension Chronic diastolic CHF   Diabetes Jardiance stopped.  Hypokalemia  BPH          Discharge Instructions  Discharge Instructions     Increase activity slowly   Complete by: As directed       Allergies as of 12/16/2020       Reactions   Ciprofloxacin Other (See Comments)   Possibly contributed to seizure-like activity resulting in  06/15/20 admission         Medication List     STOP taking these medications    cyanocobalamin 1000 MCG tablet   empagliflozin 10 MG Tabs tablet Commonly known as: JARDIANCE   Vitamin D (Ergocalciferol) 1.25 MG (50000 UNIT) Caps capsule Commonly known as: DRISDOL       TAKE these medications    atorvastatin 80 MG tablet Commonly known as: LIPITOR Take 80 mg by mouth daily.   calcium carbonate (dosed in mg elemental calcium) 1250 MG/5ML Susp Take 5 mLs (500 mg of elemental calcium total) by mouth every 6 (six) hours as needed for indigestion.   clopidogrel 75 MG tablet Commonly known as: PLAVIX Take 1 tablet (75 mg total) by mouth daily.   feeding supplement (GLUCERNA SHAKE) Liqd Take 237 mLs by mouth 3 (three) times daily between meals.   lisinopril 20 MG tablet Commonly known as: ZESTRIL Take 1 tablet (20 mg total) by mouth daily. Start taking on: December 17, 2020 What changed:  medication strength how much to take   metoprolol tartrate 50 MG tablet Commonly known as: Lopressor Take 1 tablet (50 mg total) by mouth daily.   risperiDONE 0.25 MG tablet Commonly known as: RISPERDAL Take 0.25 mg by mouth daily.   sertraline 25 MG tablet Commonly known as: ZOLOFT Take 25 mg by mouth daily.   tamsulosin 0.4 MG Caps capsule Commonly known as: FLOMAX Take 0.4 mg by mouth  2 (two) times daily.        Contact information for after-discharge care     Destination     [facility] Preferred SNF .   Service: Skilled Nursing Contact information: [place] [address] [phone]                    Allergies  Allergen Reactions   Ciprofloxacin Other (See Comments)    Possibly contributed to seizure-like activity resulting in 06/15/20 admission       Procedures/Studies: CT HEAD WO CONTRAST  Result Date: 12/11/2020 CLINICAL DATA:  Altered mental status. EXAM: CT HEAD WITHOUT CONTRAST TECHNIQUE:  Contiguous axial images were obtained from the base of the skull through the vertex without intravenous contrast. COMPARISON:  June 15, 2020. FINDINGS: Brain: Mild diffuse cortical atrophy is noted. Mild chronic ischemic white matter disease is noted. No mass effect or midline shift is noted. Ventricular size is within normal limits. There is no evidence of mass lesion, hemorrhage or acute infarction. Vascular: No hyperdense vessel or unexpected calcification. Skull: Normal. Negative for fracture or focal lesion. Sinuses/Orbits: No acute finding. Other: Small left posterior scalp hematoma is noted. IMPRESSION: Small left posterior scalp hematoma. No acute intracranial abnormality seen. Electronically Signed   By: Marijo Conception M.D.   On: 12/11/2020 15:57   US RENAL  Result Date: 12/12/2020 CLINICAL DATA:  AKI EXAM: RENAL / URINARY TRACT ULTRASOUND COMPLETE COMPARISON:  None. FINDINGS: Right Kidney: Post nephrectomy. Left Kidney: Renal measurements: 12.4 x 8 x 6.6 cm = volume: 345 mL. Echogenicity within normal limits. There is a 1.9 cm cyst at the lower pole. No solid mass or hydronephrosis visualized. Bladder: Appears normal for degree of bladder distention. Other: Prostate volume 33 mL. IMPRESSION: No hydronephrosis. Electronically Signed   By: Macy Mis M.D.   On: 12/12/2020 13:39   DG Chest Portable 1 View  Result Date: 12/11/2020 CLINICAL DATA:  Cough EXAM: PORTABLE CHEST 1 VIEW COMPARISON:  03/31/2020 FINDINGS: The heart size and mediastinal contours are within normal limits. Both lungs are clear. The visualized skeletal structures are unremarkable. IMPRESSION: No active disease. Electronically Signed   By: Ulyses Jarred M.D.   On: 12/11/2020 22:03      Subjective: No headache, chest pain, dyspnea.  No confusion, mentatl status at baseline  Discharge Exam: Vitals:   12/16/20 0425 12/16/20 0900  BP: (!) 162/94 131/86  Pulse: 88 88  Resp: 16 18  Temp: 98 F (36.7 C) 98.5 F (36.9 C)   SpO2: 100% 100%   Vitals:   12/15/20 1559 12/15/20 2143 12/16/20 0425 12/16/20 0900  BP: (!) 171/94 (!) 154/97 (!) 162/94 131/86  Pulse: 69 87 88 88  Resp: '16 15 16 18  '$ Temp: 98.5 F (36.9 C) 98.7 F (37.1 C) 98 F (36.7 C) 98.5 F (36.9 C)  TempSrc: Oral Oral Oral Oral  SpO2: 100% 100% 100% 100%  Weight:   74.1 kg   Height:        General: Pt is alert, awake, not in acute distress Cardiovascular: RRR, nl S1-S2, no murmurs appreciated.   No LE edema.   Respiratory: Normal respiratory rate and rhythm.  CTAB without rales or wheezes. Abdominal: Abdomen soft and non-tender.  No distension or HSM.   Neuro/Psych: Strength symmetric in upper and lower extremities.  Judgment and insight appear impaired by dementia, oriented to self only.   The results of significant diagnostics from this hospitalization (including imaging,  microbiology, ancillary and laboratory) are listed below for reference.     Microbiology: Recent Results (from the past 240 hour(s))  Resp Panel by RT-PCR (Flu A&B, Covid) Nasopharyngeal Swab     Status: None   Collection Time: 12/11/20 10:17 PM   Specimen: Nasopharyngeal Swab; Nasopharyngeal(NP) swabs in vial transport medium  Result Value Ref Range Status   SARS Coronavirus 2 by RT PCR NEGATIVE NEGATIVE Final    Comment: (NOTE) SARS-CoV-2 target nucleic acids are NOT DETECTED.  The SARS-CoV-2 RNA is generally detectable in upper respiratory specimens during the acute phase of infection. The lowest concentration of SARS-CoV-2 viral copies this assay can detect is 138 copies/mL. A negative result does not preclude SARS-Cov-2 infection and should not be used as the sole basis for treatment or other patient management decisions. A negative result may occur with  improper specimen collection/handling, submission of specimen other than nasopharyngeal swab, presence of viral mutation(s) within the areas targeted by this assay, and inadequate number of  viral copies(<138 copies/mL). A negative result must be combined with clinical observations, patient history, and epidemiological information. The expected result is Negative.  Fact Sheet for Patients:  EntrepreneurPulse.com.au  Fact Sheet for Healthcare Providers:  IncredibleEmployment.be  This test is no t yet approved or cleared by the [place] FDA and  has been authorized for detection and/or diagnosis of SARS-CoV-2 by FDA under an Emergency Use Authorization (EUA). This EUA will remain  in effect (meaning this test can be used) for the duration of the COVID-19 declaration under Section 564(b)(1) of the Act, 21 U.S.C.section 360bbb-3(b)(1), unless the authorization is terminated  or revoked sooner.       Influenza A by PCR NEGATIVE NEGATIVE Final   Influenza B by PCR NEGATIVE NEGATIVE Final    Comment: (NOTE) The Xpert Xpress SARS-CoV-2/FLU/RSV plus assay is intended as an aid in the diagnosis of influenza from Nasopharyngeal swab specimens and should not be used as a sole basis for treatment. Nasal washings and aspirates are unacceptable for Xpert Xpress SARS-CoV-2/FLU/RSV testing.  Fact Sheet for Patients: EntrepreneurPulse.com.au  Fact Sheet for Healthcare Providers: IncredibleEmployment.be  This test is not yet approved or cleared by the [place] FDA and has been authorized for detection and/or diagnosis of SARS-CoV-2 by FDA under an Emergency Use Authorization (EUA). This EUA will remain in effect (meaning this test can be used) for the duration of the COVID-19 declaration under Section 564(b)(1) of the Act, 21 U.S.C. section 360bbb-3(b)(1), unless the authorization is terminated or revoked.  Performed at [facility], [address] [place], [place] [address]   Resp Panel by RT-PCR (Flu A&B, Covid) Nasopharyngeal Swab     Status: None   Collection Time: 12/15/20  9:14  PM   Specimen: Nasopharyngeal Swab; Nasopharyngeal(NP) swabs in vial transport medium  Result Value Ref Range Status   SARS Coronavirus 2 by RT PCR NEGATIVE NEGATIVE Final    Comment: (NOTE) SARS-CoV-2 target nucleic acids are NOT DETECTED.  The SARS-CoV-2 RNA is generally detectable in upper respiratory specimens during the acute phase of infection. The lowest concentration of SARS-CoV-2 viral copies this assay can detect is 138 copies/mL. A negative result does not preclude SARS-Cov-2 infection and should not be used as the sole basis for treatment or other patient management decisions. A negative result may occur with  improper specimen collection/handling, submission of specimen other than nasopharyngeal swab, presence of viral mutation(s) within the areas targeted by this assay, and inadequate number of viral copies(<138 copies/mL).  A negative result must be combined with clinical observations, patient history, and epidemiological information. The expected result is Negative.  Fact Sheet for Patients:  EntrepreneurPulse.com.au  Fact Sheet for Healthcare Providers:  IncredibleEmployment.be  This test is no t yet approved or cleared by the [place] FDA and  has been authorized for detection and/or diagnosis of SARS-CoV-2 by FDA under an Emergency Use Authorization (EUA). This EUA will remain  in effect (meaning this test can be used) for the duration of the COVID-19 declaration under Section 564(b)(1) of the Act, 21 U.S.C.section 360bbb-3(b)(1), unless the authorization is terminated  or revoked sooner.       Influenza A by PCR NEGATIVE NEGATIVE Final   Influenza B by PCR NEGATIVE NEGATIVE Final    Comment: (NOTE) The Xpert Xpress SARS-CoV-2/FLU/RSV plus assay is intended as an aid in the diagnosis of influenza from Nasopharyngeal swab specimens and should not be used as a sole basis for treatment. Nasal washings and aspirates are  unacceptable for Xpert Xpress SARS-CoV-2/FLU/RSV testing.  Fact Sheet for Patients: EntrepreneurPulse.com.au  Fact Sheet for Healthcare Providers: IncredibleEmployment.be  This test is not yet approved or cleared by the [place] FDA and has been authorized for detection and/or diagnosis of SARS-CoV-2 by FDA under an Emergency Use Authorization (EUA). This EUA will remain in effect (meaning this test can be used) for the duration of the COVID-19 declaration under Section 564(b)(1) of the Act, 21 U.S.C. section 360bbb-3(b)(1), unless the authorization is terminated or revoked.  Performed at [facility], [address] [place], [place] [address]      Labs: BNP (last 3 results) Recent Labs    03/31/20 1007  BNP [id number]   Basic Metabolic Panel: Recent Labs  Lab 12/11/20 1528 12/12/20 0737 12/13/20 0552 12/14/20 0519 12/16/20 0414  NA 143 140 143 142 137  K 3.8 3.8 3.1* 3.2* 3.7  CL 110 103 100 101 101  CO2 23 30 35* 34* 30  GLUCOSE 181* 236* 159* 207* 166*  BUN 21 32* 24* 20 19  CREATININE 1.92* 2.32* 1.63* 1.43* 1.59*  CALCIUM 8.8* 8.2* 8.0* 8.1* 8.3*  MG  --   --  2.0  --   --    Liver Function Tests: Recent Labs  Lab 12/11/20 1528 12/12/20 0737  AST 27 18  ALT 17 17  ALKPHOS 43 35*  BILITOT 1.4* 0.9  PROT 6.0* 5.3*  ALBUMIN 2.9* 2.5*   No results for input(s): LIPASE, AMYLASE in the last 168 hours. No results for input(s): AMMONIA in the last 168 hours. CBC: Recent Labs  Lab 12/11/20 1528 12/12/20 0737 12/13/20 0552 12/14/20 0519 12/16/20 0414  WBC 9.4 7.1 5.6 5.1 4.8  NEUTROABS 6.3  --   --   --   --   HGB 11.8* 9.6* 9.4* 10.6* 9.6*  HCT 36.2* 28.9* 27.5* 31.1* 28.4*  MCV 100.6* 99.7 98.9 98.4 96.6  PLT 242 186 181 191 207   Cardiac Enzymes: No results for input(s): CKTOTAL, CKMB, CKMBINDEX, TROPONINI in the last 168 hours. BNP: Invalid input(s): POCBNP CBG: Recent Labs  Lab  12/15/20 0814 12/15/20 1156 12/15/20 1629 12/15/20 2145 12/16/20 0808  GLUCAP 154* 251* 178* 228* 160*   D-Dimer No results for input(s): DDIMER in the last 72 hours. Hgb A1c No results for input(s): HGBA1C in the last 72 hours. Lipid Profile No results for input(s): CHOL, HDL, LDLCALC, TRIG, CHOLHDL, LDLDIRECT in the last 72 hours. Thyroid function studies No results for input(s): TSH, T4TOTAL,  T3FREE, THYROIDAB in the last 72 hours.  Invalid input(s): FREET3 Anemia work up No results for input(s): VITAMINB12, FOLATE, FERRITIN, TIBC, IRON, RETICCTPCT in the last 72 hours. Urinalysis    Component Value Date/Time   COLORURINE AMBER (A) 12/11/2020 2217   APPEARANCEUR CLOUDY (A) 12/11/2020 2217   LABSPEC 1.023 12/11/2020 2217   PHURINE 5.0 12/11/2020 2217   GLUCOSEU >=500 (A) 12/11/2020 2217   HGBUR NEGATIVE 12/11/2020 2217   [place] NEGATIVE 12/11/2020 2217   KETONESUR NEGATIVE 12/11/2020 2217   PROTEINUR >=300 (A) 12/11/2020 2217   NITRITE NEGATIVE 12/11/2020 2217   LEUKOCYTESUR NEGATIVE 12/11/2020 2217   Sepsis Labs Invalid input(s): PROCALCITONIN,  WBC,  LACTICIDVEN Microbiology Recent Results (from the past 240 hour(s))  Resp Panel by RT-PCR (Flu A&B, Covid) Nasopharyngeal Swab     Status: None   Collection Time: 12/11/20 10:17 PM   Specimen: Nasopharyngeal Swab; Nasopharyngeal(NP) swabs in vial transport medium  Result Value Ref Range Status   SARS Coronavirus 2 by RT PCR NEGATIVE NEGATIVE Final    Comment: (NOTE) SARS-CoV-2 target nucleic acids are NOT DETECTED.  The SARS-CoV-2 RNA is generally detectable in upper respiratory specimens during the acute phase of infection. The lowest concentration of SARS-CoV-2 viral copies this assay can detect is 138 copies/mL. A negative result does not preclude SARS-Cov-2 infection and should not be used as the sole basis for treatment or other patient management decisions. A negative result may occur with  improper  specimen collection/handling, submission of specimen other than nasopharyngeal swab, presence of viral mutation(s) within the areas targeted by this assay, and inadequate number of viral copies(<138 copies/mL). A negative result must be combined with clinical observations, patient history, and epidemiological information. The expected result is Negative.  Fact Sheet for Patients:  EntrepreneurPulse.com.au  Fact Sheet for Healthcare Providers:  IncredibleEmployment.be  This test is no t yet approved or cleared by the [place] FDA and  has been authorized for detection and/or diagnosis of SARS-CoV-2 by FDA under an Emergency Use Authorization (EUA). This EUA will remain  in effect (meaning this test can be used) for the duration of the COVID-19 declaration under Section 564(b)(1) of the Act, 21 U.S.C.section 360bbb-3(b)(1), unless the authorization is terminated  or revoked sooner.       Influenza A by PCR NEGATIVE NEGATIVE Final   Influenza B by PCR NEGATIVE NEGATIVE Final    Comment: (NOTE) The Xpert Xpress SARS-CoV-2/FLU/RSV plus assay is intended as an aid in the diagnosis of influenza from Nasopharyngeal swab specimens and should not be used as a sole basis for treatment. Nasal washings and aspirates are unacceptable for Xpert Xpress SARS-CoV-2/FLU/RSV testing.  Fact Sheet for Patients: EntrepreneurPulse.com.au  Fact Sheet for Healthcare Providers: IncredibleEmployment.be  This test is not yet approved or cleared by the [place] FDA and has been authorized for detection and/or diagnosis of SARS-CoV-2 by FDA under an Emergency Use Authorization (EUA). This EUA will remain in effect (meaning this test can be used) for the duration of the COVID-19 declaration under Section 564(b)(1) of the Act, 21 U.S.C. section 360bbb-3(b)(1), unless the authorization is terminated or revoked.  Performed at  [facility], [address] [place], [place] [address]   Resp Panel by RT-PCR (Flu A&B, Covid) Nasopharyngeal Swab     Status: None   Collection Time: 12/15/20  9:14 PM   Specimen: Nasopharyngeal Swab; Nasopharyngeal(NP) swabs in vial transport medium  Result Value Ref Range Status   SARS Coronavirus 2 by RT PCR NEGATIVE  NEGATIVE Final    Comment: (NOTE) SARS-CoV-2 target nucleic acids are NOT DETECTED.  The SARS-CoV-2 RNA is generally detectable in upper respiratory specimens during the acute phase of infection. The lowest concentration of SARS-CoV-2 viral copies this assay can detect is 138 copies/mL. A negative result does not preclude SARS-Cov-2 infection and should not be used as the sole basis for treatment or other patient management decisions. A negative result may occur with  improper specimen collection/handling, submission of specimen other than nasopharyngeal swab, presence of viral mutation(s) within the areas targeted by this assay, and inadequate number of viral copies(<138 copies/mL). A negative result must be combined with clinical observations, patient history, and epidemiological information. The expected result is Negative.  Fact Sheet for Patients:  EntrepreneurPulse.com.au  Fact Sheet for Healthcare Providers:  IncredibleEmployment.be  This test is no t yet approved or cleared by the [place] FDA and  has been authorized for detection and/or diagnosis of SARS-CoV-2 by FDA under an Emergency Use Authorization (EUA). This EUA will remain  in effect (meaning this test can be used) for the duration of the COVID-19 declaration under Section 564(b)(1) of the Act, 21 U.S.C.section 360bbb-3(b)(1), unless the authorization is terminated  or revoked sooner.       Influenza A by PCR NEGATIVE NEGATIVE Final   Influenza B by PCR NEGATIVE NEGATIVE Final    Comment: (NOTE) The Xpert Xpress SARS-CoV-2/FLU/RSV plus  assay is intended as an aid in the diagnosis of influenza from Nasopharyngeal swab specimens and should not be used as a sole basis for treatment. Nasal washings and aspirates are unacceptable for Xpert Xpress SARS-CoV-2/FLU/RSV testing.  Fact Sheet for Patients: EntrepreneurPulse.com.au  Fact Sheet for Healthcare Providers: IncredibleEmployment.be  This test is not yet approved or cleared by the [place] FDA and has been authorized for detection and/or diagnosis of SARS-CoV-2 by FDA under an Emergency Use Authorization (EUA). This EUA will remain in effect (meaning this test can be used) for the duration of the COVID-19 declaration under Section 564(b)(1) of the Act, 21 U.S.C. section 360bbb-3(b)(1), unless the authorization is terminated or revoked.  Performed at [facility], [address] [place], [place] [address]      Time coordinating discharge: 25 minutes [place] controlled substances registry was reviewed for this patient     30 Day Unplanned Readmission Risk Score    Flowsheet Row ED to Hosp-Admission (Current) from 12/11/2020 in [place] (1C)  30 Day Unplanned Readmission Risk Score (%) 25.83 Filed at 12/16/2020 0801       This score is the patient's risk of an unplanned readmission within 30 days of being discharged (0 -100%). The score is based on dignosis, age, lab data, medications, orders, and past utilization.   Low:  0-14.9   Medium: 15-21.9   High: 22-29.9   Extreme: 30 and above            SIGNED:   Edwin Dada, MD  [facility] 12/16/2020, 10:50 AM

## 2021-02-04 ENCOUNTER — Other Ambulatory Visit: Payer: Self-pay

## 2021-02-04 ENCOUNTER — Emergency Department: Payer: Medicare PPO

## 2021-02-04 ENCOUNTER — Inpatient Hospital Stay
Admission: EM | Admit: 2021-02-04 | Discharge: 2021-02-06 | DRG: 871 | Disposition: A | Discharge status: Hospice | Payer: Medicare PPO | Attending: Internal Medicine | Admitting: Internal Medicine

## 2021-02-04 DIAGNOSIS — Z66 Do not resuscitate: Secondary | ICD-10-CM | POA: Diagnosis present

## 2021-02-04 DIAGNOSIS — Z6821 Body mass index (BMI) 21.0-21.9, adult: Secondary | ICD-10-CM

## 2021-02-04 DIAGNOSIS — N184 Chronic kidney disease, stage 4 (severe): Secondary | ICD-10-CM | POA: Diagnosis present

## 2021-02-04 DIAGNOSIS — E875 Hyperkalemia: Secondary | ICD-10-CM | POA: Diagnosis not present

## 2021-02-04 DIAGNOSIS — E440 Moderate protein-calorie malnutrition: Secondary | ICD-10-CM | POA: Diagnosis present

## 2021-02-04 DIAGNOSIS — G9341 Metabolic encephalopathy: Secondary | ICD-10-CM | POA: Diagnosis present

## 2021-02-04 DIAGNOSIS — G9340 Encephalopathy, unspecified: Secondary | ICD-10-CM | POA: Diagnosis not present

## 2021-02-04 DIAGNOSIS — I130 Hypertensive heart and chronic kidney disease with heart failure and stage 1 through stage 4 chronic kidney disease, or unspecified chronic kidney disease: Secondary | ICD-10-CM | POA: Diagnosis present

## 2021-02-04 DIAGNOSIS — N189 Chronic kidney disease, unspecified: Secondary | ICD-10-CM

## 2021-02-04 DIAGNOSIS — E7800 Pure hypercholesterolemia, unspecified: Secondary | ICD-10-CM | POA: Diagnosis present

## 2021-02-04 DIAGNOSIS — F0390 Unspecified dementia without behavioral disturbance: Secondary | ICD-10-CM | POA: Diagnosis present

## 2021-02-04 DIAGNOSIS — I2510 Atherosclerotic heart disease of native coronary artery without angina pectoris: Secondary | ICD-10-CM | POA: Diagnosis not present

## 2021-02-04 DIAGNOSIS — I69991 Dysphagia following unspecified cerebrovascular disease: Secondary | ICD-10-CM

## 2021-02-04 DIAGNOSIS — R7989 Other specified abnormal findings of blood chemistry: Secondary | ICD-10-CM | POA: Diagnosis present

## 2021-02-04 DIAGNOSIS — I69319 Unspecified symptoms and signs involving cognitive functions following cerebral infarction: Secondary | ICD-10-CM | POA: Diagnosis not present

## 2021-02-04 DIAGNOSIS — Z20822 Contact with and (suspected) exposure to covid-19: Secondary | ICD-10-CM | POA: Diagnosis present

## 2021-02-04 DIAGNOSIS — L89154 Pressure ulcer of sacral region, stage 4: Secondary | ICD-10-CM | POA: Diagnosis present

## 2021-02-04 DIAGNOSIS — Z905 Acquired absence of kidney: Secondary | ICD-10-CM

## 2021-02-04 DIAGNOSIS — E1169 Type 2 diabetes mellitus with other specified complication: Secondary | ICD-10-CM | POA: Diagnosis present

## 2021-02-04 DIAGNOSIS — F17210 Nicotine dependence, cigarettes, uncomplicated: Secondary | ICD-10-CM | POA: Diagnosis present

## 2021-02-04 DIAGNOSIS — N178 Other acute kidney failure: Secondary | ICD-10-CM | POA: Diagnosis not present

## 2021-02-04 DIAGNOSIS — N179 Acute kidney failure, unspecified: Secondary | ICD-10-CM

## 2021-02-04 DIAGNOSIS — J690 Pneumonitis due to inhalation of food and vomit: Secondary | ICD-10-CM | POA: Diagnosis present

## 2021-02-04 DIAGNOSIS — E870 Hyperosmolality and hypernatremia: Secondary | ICD-10-CM | POA: Diagnosis present

## 2021-02-04 DIAGNOSIS — Z515 Encounter for palliative care: Secondary | ICD-10-CM

## 2021-02-04 DIAGNOSIS — E1165 Type 2 diabetes mellitus with hyperglycemia: Secondary | ICD-10-CM | POA: Diagnosis present

## 2021-02-04 DIAGNOSIS — I69391 Dysphagia following cerebral infarction: Secondary | ICD-10-CM | POA: Diagnosis not present

## 2021-02-04 DIAGNOSIS — R748 Abnormal levels of other serum enzymes: Secondary | ICD-10-CM | POA: Diagnosis present

## 2021-02-04 DIAGNOSIS — R6520 Severe sepsis without septic shock: Secondary | ICD-10-CM | POA: Diagnosis not present

## 2021-02-04 DIAGNOSIS — Z881 Allergy status to other antibiotic agents status: Secondary | ICD-10-CM

## 2021-02-04 DIAGNOSIS — I5032 Chronic diastolic (congestive) heart failure: Secondary | ICD-10-CM | POA: Diagnosis present

## 2021-02-04 DIAGNOSIS — N390 Urinary tract infection, site not specified: Secondary | ICD-10-CM | POA: Diagnosis present

## 2021-02-04 DIAGNOSIS — A419 Sepsis, unspecified organism: Secondary | ICD-10-CM | POA: Diagnosis present

## 2021-02-04 DIAGNOSIS — Z7902 Long term (current) use of antithrombotics/antiplatelets: Secondary | ICD-10-CM

## 2021-02-04 DIAGNOSIS — D631 Anemia in chronic kidney disease: Secondary | ICD-10-CM | POA: Diagnosis present

## 2021-02-04 DIAGNOSIS — J189 Pneumonia, unspecified organism: Secondary | ICD-10-CM | POA: Diagnosis present

## 2021-02-04 DIAGNOSIS — E785 Hyperlipidemia, unspecified: Secondary | ICD-10-CM

## 2021-02-04 DIAGNOSIS — I6930 Unspecified sequelae of cerebral infarction: Secondary | ICD-10-CM

## 2021-02-04 DIAGNOSIS — R6521 Severe sepsis with septic shock: Secondary | ICD-10-CM | POA: Diagnosis present

## 2021-02-04 DIAGNOSIS — E1122 Type 2 diabetes mellitus with diabetic chronic kidney disease: Secondary | ICD-10-CM | POA: Diagnosis present

## 2021-02-04 DIAGNOSIS — R627 Adult failure to thrive: Secondary | ICD-10-CM

## 2021-02-04 DIAGNOSIS — Z79899 Other long term (current) drug therapy: Secondary | ICD-10-CM

## 2021-02-04 DIAGNOSIS — Z833 Family history of diabetes mellitus: Secondary | ICD-10-CM

## 2021-02-04 DIAGNOSIS — R1310 Dysphagia, unspecified: Secondary | ICD-10-CM | POA: Diagnosis present

## 2021-02-04 DIAGNOSIS — Z8249 Family history of ischemic heart disease and other diseases of the circulatory system: Secondary | ICD-10-CM

## 2021-02-04 LAB — COMPREHENSIVE METABOLIC PANEL
ALT: 71 U/L — ABNORMAL HIGH (ref 0–44)
AST: 84 U/L — ABNORMAL HIGH (ref 15–41)
Albumin: 2.2 g/dL — ABNORMAL LOW (ref 3.5–5.0)
Alkaline Phosphatase: 49 U/L (ref 38–126)
Anion gap: 16 — ABNORMAL HIGH (ref 5–15)
BUN: 118 mg/dL — ABNORMAL HIGH (ref 8–23)
CO2: 18 mmol/L — ABNORMAL LOW (ref 22–32)
Calcium: 8.7 mg/dL — ABNORMAL LOW (ref 8.9–10.3)
Chloride: 112 mmol/L — ABNORMAL HIGH (ref 98–111)
Creatinine, Ser: 5.52 mg/dL — ABNORMAL HIGH (ref 0.61–1.24)
GFR, Estimated: 10 mL/min — ABNORMAL LOW (ref >60)
Glucose, Bld: 230 mg/dL — ABNORMAL HIGH (ref 70–99)
Potassium: 6.1 mmol/L — ABNORMAL HIGH (ref 3.5–5.1)
Sodium: 146 mmol/L — ABNORMAL HIGH (ref 135–145)
Total Bilirubin: 1.4 mg/dL — ABNORMAL HIGH (ref 0.3–1.2)
Total Protein: 6.8 g/dL (ref 6.5–8.1)

## 2021-02-04 LAB — CBC WITH DIFFERENTIAL/PLATELET
Abs Immature Granulocytes: 0.06 10*3/uL (ref 0.00–0.07)
Basophils Absolute: 0 10*3/uL (ref 0.0–0.1)
Basophils Relative: 0 %
Eosinophils Absolute: 0 10*3/uL (ref 0.0–0.5)
Eosinophils Relative: 0 %
HCT: 27.9 % — ABNORMAL LOW (ref 39.0–52.0)
Hemoglobin: 8.6 g/dL — ABNORMAL LOW (ref 13.0–17.0)
Immature Granulocytes: 1 %
Lymphocytes Relative: 14 %
Lymphs Abs: 1.7 10*3/uL (ref 0.7–4.0)
MCH: 32.7 pg (ref 26.0–34.0)
MCHC: 30.8 g/dL (ref 30.0–36.0)
MCV: 106.1 fL — ABNORMAL HIGH (ref 80.0–100.0)
Monocytes Absolute: 1 10*3/uL (ref 0.1–1.0)
Monocytes Relative: 8 %
Neutro Abs: 10 10*3/uL — ABNORMAL HIGH (ref 1.7–7.7)
Neutrophils Relative %: 77 %
Platelets: 412 10*3/uL — ABNORMAL HIGH (ref 150–400)
RBC: 2.63 MIL/uL — ABNORMAL LOW (ref 4.22–5.81)
RDW: 13.6 % (ref 11.5–15.5)
WBC: 12.9 10*3/uL — ABNORMAL HIGH (ref 4.0–10.5)
nRBC: 0 % (ref 0.0–0.2)

## 2021-02-04 LAB — RESP PANEL BY RT-PCR (FLU A&B, COVID) ARPGX2
Influenza A by PCR: NEGATIVE
Influenza B by PCR: NEGATIVE
SARS Coronavirus 2 by RT PCR: NEGATIVE

## 2021-02-04 LAB — APTT: aPTT: 30 seconds (ref 24–36)

## 2021-02-04 LAB — LACTIC ACID, PLASMA
Lactic Acid, Venous: 2.4 mmol/L (ref 0.5–1.9)
Lactic Acid, Venous: 1.9 mmol/L (ref 0.5–1.9)

## 2021-02-04 LAB — PROTIME-INR
INR: 1.3 — ABNORMAL HIGH (ref 0.8–1.2)
Prothrombin Time: 16.3 seconds — ABNORMAL HIGH (ref 11.4–15.2)

## 2021-02-04 LAB — PROCALCITONIN: Procalcitonin: 2.27 ng/mL

## 2021-02-04 MED ORDER — CEFTRIAXONE SODIUM 2 G IJ SOLR
2.0000 g | INTRAMUSCULAR | Status: DC
  Administered 2021-02-04: 2 g via INTRAVENOUS
  Filled 2021-02-04 (×2): qty 20

## 2021-02-04 MED ORDER — NOREPINEPHRINE 4 MG/250ML-% IV SOLN
2.0000 ug/min | INTRAVENOUS | Status: DC
  Administered 2021-02-04: 2 ug/min via INTRAVENOUS
  Filled 2021-02-04: qty 250

## 2021-02-04 MED ORDER — SODIUM CHLORIDE 0.9 % IV BOLUS (SEPSIS)
2000.0000 mL | Freq: Once | INTRAVENOUS | Status: AC
  Administered 2021-02-04: 2000 mL via INTRAVENOUS

## 2021-02-04 MED ORDER — IPRATROPIUM-ALBUTEROL 0.5-2.5 (3) MG/3ML IN SOLN
3.0000 mL | Freq: Four times a day (QID) | RESPIRATORY_TRACT | Status: DC | PRN
  Administered 2021-02-05: 01:00:00 3 mL via RESPIRATORY_TRACT
  Filled 2021-02-04: qty 3

## 2021-02-04 MED ORDER — MORPHINE SULFATE (PF) 2 MG/ML IV SOLN
2.0000 mg | Freq: Once | INTRAVENOUS | Status: AC
Start: 2021-02-04 — End: 2021-02-04
  Administered 2021-02-04: 2 mg via INTRAVENOUS
  Filled 2021-02-04: qty 1

## 2021-02-04 MED ORDER — SODIUM CHLORIDE 0.9 % IV SOLN
500.0000 mg | INTRAVENOUS | Status: DC
  Administered 2021-02-04: 500 mg via INTRAVENOUS
  Filled 2021-02-04 (×2): qty 500

## 2021-02-04 MED ORDER — SODIUM CHLORIDE 0.9 % IV SOLN: 250.0000 mL | INTRAVENOUS | Status: DC

## 2021-02-04 MED ORDER — LACTATED RINGERS IV BOLUS
1000.0000 mL | Freq: Once | INTRAVENOUS | Status: AC
  Administered 2021-02-04: 1000 mL via INTRAVENOUS

## 2021-02-04 NOTE — Progress Notes (Signed)
Elink monitoring for sepsis protocol 

## 2021-02-04 NOTE — ED Provider Notes (Signed)
[facility] Emergency Department Provider Note  ____________________________________________  Time seen: Approximately 9:38 PM  I have reviewed the triage vital signs and the nursing notes.   HISTORY  Chief Complaint Chest Congestion    Level 5 Caveat: Portions of the History and Physical including HPI and review of systems are unable to be completely obtained due to patient being a poor historian   HPI Steven Ingram is a 70 y.o. male with a history of hypertension, diabetes who was sent to the ED due to severe generalized weakness, cough worsening over the last 2 days. Per EMS report, patient normally is ambulatory and currently unable to get out of bed.   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   High cholesterol    History of CVA (cerebrovascular accident) 07/19/2019   HLD (hyperlipidemia) 03/31/2020   HTN (hypertension) 07/19/2019   Hypertension    Hypokalemia    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   Malnutrition of moderate degree 07/20/2019   SIRS (systemic inflammatory response syndrome) ([place]) 03/31/2020   Type 2 diabetes mellitus with stage 3 chronic kidney disease ([place]) 07/19/2019     Patient Active Problem List   Diagnosis Date Noted   Sepsis ([place]) 02/04/2021   FTT (failure to thrive) in adult 12/11/2020   AKI (acute kidney injury) ([place]) 12/11/2020   Dysphagia as late effect of stroke 09/28/2020   Seizure-like activity ([place]) 06/15/2020   Hyperlipidemia associated with type 2 diabetes mellitus ([place]) 06/15/2020   Dementia  without behavioral disturbance ([place])    Hypokalemia    Encephalopathy acute 40/34/7425   Acute metabolic encephalopathy 95/63/8756   SIRS (systemic inflammatory response syndrome) ([place]) 03/31/2020   Acute kidney injury superimposed on CKD ([place]) 03/31/2020   HLD (hyperlipidemia) 03/31/2020   Chronic diastolic CHF (congestive heart failure) ([place]) 03/31/2020   Malnutrition of moderate degree 07/20/2019   Dizziness 07/19/2019   Unsteady gait when walking 07/19/2019   History of CVA (cerebrovascular accident) 07/19/2019   Type 2 diabetes mellitus with stage 3 chronic kidney disease ([place]) 07/19/2019   CAD (coronary artery disease) 07/19/2019   Hypertension associated with diabetes ([place]) 07/19/2019   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 Authorizing Provider  atorvastatin (LIPITOR) 80 MG tablet Take 80 mg by mouth daily.    [provider]  Calcium Carbonate Antacid (CALCIUM CARBONATE, DOSED IN MG ELEMENTAL CALCIUM,) 1250 MG/5ML SUSP Take 5 mLs (500 mg of elemental calcium total) by mouth every 6 (six) hours as needed for indigestion. 12/16/20   Danford, Suann Larry, MD  clopidogrel (PLAVIX) 75 MG tablet Take 1 tablet (75 mg total) by mouth daily. 07/21/19   Jennye Boroughs, MD  feeding supplement, GLUCERNA SHAKE, (GLUCERNA SHAKE) LIQD Take 237 mLs by mouth 3 (three) times daily between meals. 12/16/20   Danford, Suann Larry, MD  lisinopril (ZESTRIL) 40 MG tablet Take 40 mg by mouth daily. 01/27/21   [provider]  losartan  (COZAAR) 100 MG tablet Take 100 mg by mouth daily. 01/03/21   [provider]  metoprolol tartrate (LOPRESSOR) 50 MG tablet Take 1 tablet (50 mg total) by mouth daily. Patient not taking: No sig reported 06/21/20 06/21/21  Sharen Hones, MD  risperiDONE (RISPERDAL) 0.25 MG tablet Take 0.25 mg by mouth daily. Patient not taking: No sig reported 05/25/20   [provider]  risperiDONE (RISPERDAL) 0.5 MG tablet Take 0.5 mg by mouth daily at 12 noon. 01/18/21   [provider]  risperiDONE (RISPERDAL) 1 MG tablet Take 1 mg by mouth at bedtime. 12/06/20   [provider]  sertraline (ZOLOFT) 25 MG tablet Take 25 mg by mouth daily.    [provider]  tamsulosin (FLOMAX) 0.4 MG CAPS capsule Take 0.4 mg by mouth 2 (two) times daily.    [provider]     Allergies Ciprofloxacin   Family History  Problem Relation Age of Onset   Diabetes Mellitus II Mother    Hypertension Mother    Diabetes Mellitus II Father    Hypertension Father    Prostate cancer Neg Hx    Bladder Cancer Neg Hx    Kidney cancer Neg Hx     Social History Social History   Tobacco Use   Smoking status: Every Day    Packs/day: 1.00    Years: 53.00    Pack years: 53.00    Types: Cigarettes   Smokeless tobacco: Never  Vaping Use   Vaping Use: Never used  Substance Use Topics   Alcohol use: Yes    Comment: last drink yesterday   Drug use: Never    Review of Systems Level 5 Caveat: Portions of the History and Physical including HPI and review of systems are unable to be completely obtained due to patient being a poor historian   Constitutional:   No known fever.  ENT:   No rhinorrhea. Cardiovascular:   No chest pain or syncope. Respiratory:   No dyspnea positive cough. Gastrointestinal:   Negative for abdominal pain, vomiting and diarrhea.  Musculoskeletal:   Negative for focal pain or swelling ____________________________________________   PHYSICAL  EXAM:  VITAL SIGNS: ED Triage Vitals  Enc Vitals Group     BP 02/04/21 1656 92/61     Pulse Rate 02/04/21 1656 (!) 115     Resp 02/04/21 1656 (!) 36     Temp 02/04/21 1656 98.3 F (36.8 C)     Temp Source 02/04/21 1656 Oral     SpO2 02/04/21 1656 98 %     Weight 02/04/21 1721 157 lb 12.8 oz (71.6 kg)     Height --      Head Circumference --      Peak Flow --      Pain Score --      Pain Loc --      Pain Edu? --      Excl. in [place]? --     Vital signs reviewed, nursing assessments reviewed.   Constitutional:   Awake, not oriented.  Ill-appearing.. Eyes:   Conjunctivae are normal. EOMI. PERRL. ENT      Head:   Normocephalic and atraumatic.      Nose:   No  congestion/rhinnorhea.       Mouth/Throat:   Dry mucous membranes, no pharyngeal erythema. No peritonsillar mass.       Neck:   No meningismus. Full ROM. Hematological/Lymphatic/Immunilogical:   No cervical lymphadenopathy. Cardiovascular:   Tachycardia heart rate 110. Symmetric bilateral radial and DP pulses.  No murmurs. Cap refill less than 2 seconds. Respiratory:   Tachypnea, respiratory rate of 22.  Diffuse rhonchi.. Gastrointestinal:   Soft and nontender. Non distended. There is no CVA tenderness.  No rebound, rigidity, or guarding. Genitourinary:   deferred Musculoskeletal:   Normal range of motion in all extremities. No joint effusions.  No lower extremity tenderness.  No edema. Neurologic:   Currently nonverbal.  Unable to follow commands. Unable to participate in neurologic exam..  Skin:    Skin is warm, dry and intact. No rash noted.  No petechiae, purpura, or bullae.  ____________________________________________    LABS (pertinent positives/negatives) (all labs ordered are listed, but only abnormal results are displayed) Labs Reviewed  LACTIC ACID, PLASMA - Abnormal; Notable for the following components:      Result Value   Lactic Acid, Venous 2.4 (*)    All other components within normal limits   COMPREHENSIVE METABOLIC PANEL - Abnormal; Notable for the following components:   Sodium 146 (*)    Potassium 6.1 (*)    Chloride 112 (*)    CO2 18 (*)    Glucose, Bld 230 (*)    BUN 118 (*)    Creatinine, Ser 5.52 (*)    Calcium 8.7 (*)    Albumin 2.2 (*)    AST 84 (*)    ALT 71 (*)    Total Bilirubin 1.4 (*)    GFR, Estimated 10 (*)    Anion gap 16 (*)    All other components within normal limits  CBC WITH DIFFERENTIAL/PLATELET - Abnormal; Notable for the following components:   WBC 12.9 (*)    RBC 2.63 (*)    Hemoglobin 8.6 (*)    HCT 27.9 (*)    MCV 106.1 (*)    Platelets 412 (*)    Neutro Abs 10.0 (*)    All other components within normal limits  PROTIME-INR - Abnormal; Notable for the following components:   Prothrombin Time 16.3 (*)    INR 1.3 (*)    All other components within normal limits  RESP PANEL BY RT-PCR (FLU A&B, COVID) ARPGX2  CULTURE, BLOOD (ROUTINE X 2)  CULTURE, BLOOD (ROUTINE X 2)  URINE CULTURE  LACTIC ACID, PLASMA  APTT  PROCALCITONIN  URINALYSIS, COMPLETE (UACMP) WITH MICROSCOPIC   ____________________________________________   EKG    ____________________________________________    RADIOLOGY  DG Chest Port 1 View  Result Date: 02/04/2021 CLINICAL DATA:  Questionable sepsis - evaluate for abnormality EXAM: PORTABLE CHEST 1 VIEW COMPARISON:  December 03, 2020 FINDINGS: The cardiomediastinal silhouette is unchanged in contour. No pleural effusion. No pneumothorax. No acute pleuroparenchymal abnormality. Surgical clips project over the upper abdomen. Multilevel degenerative changes of the thoracic spine. IMPRESSION: No acute cardiopulmonary abnormality. Electronically Signed   By: Valentino Saxon M.D.   On: 02/04/2021 17:19    ____________________________________________   PROCEDURES .Critical Care Performed by: Carrie Mew, MD Authorized by: Carrie Mew, MD   Critical care provider statement:    Critical care time  (minutes):  35   Critical care time was exclusive of:  Separately billable procedures and treating other patients   Critical care was necessary to treat or prevent imminent  or life-threatening deterioration of the following conditions:  Sepsis, respiratory failure, shock, CNS failure or compromise and circulatory failure   Critical care was time spent personally by me on the following activities:  Development of treatment plan with patient or surrogate, discussions with consultants, evaluation of patient's response to treatment, examination of patient, obtaining history from patient or surrogate, ordering and performing treatments and interventions, ordering and review of laboratory studies, ordering and review of radiographic studies, pulse oximetry, re-evaluation of patient's condition and review of old charts  ____________________________________________  DIFFERENTIAL DIAGNOSIS   Pneumonia, pulmonary edema, pleural effusion, sepsis, COVID-pneumonia  CLINICAL IMPRESSION / ASSESSMENT AND PLAN / ED COURSE  Medications ordered in the ED: Medications  cefTRIAXone (ROCEPHIN) 2 g in sodium chloride 0.9 % 100 mL IVPB (0 g Intravenous Stopped 02/04/21 1805)  azithromycin (ZITHROMAX) 500 mg in sodium chloride 0.9 % 250 mL IVPB (0 mg Intravenous Stopped 02/04/21 1912)  0.9 %  sodium chloride infusion (has no administration in time range)  sodium chloride 0.9 % bolus 2,000 mL (0 mLs Intravenous Stopped 02/04/21 2004)  morphine 2 MG/ML injection 2 mg (2 mg Intravenous Given 02/04/21 2012)  lactated ringers bolus 1,000 mL (0 mLs Intravenous Stopped 02/04/21 2107)    Pertinent labs & imaging results that were available during my care of the patient were reviewed by me and considered in my medical decision making (see chart for details).   Steven Ingram was evaluated in Emergency Department on 02/04/2021 for the symptoms described in the history of present illness. He was evaluated in the context of  the global COVID-19 pandemic, which necessitated consideration that the patient might be at risk for infection with the SARS-CoV-2 virus that causes COVID-19. Institutional protocols and algorithms that pertain to the evaluation of patients at risk for COVID-19 are in a state of rapid change based on information released by regulatory bodies including the CDC and federal and state organizations. These policies and algorithms were followed during the patient's care in the ED.   Patient presents with tachypnea tachycardia, hypoxia of 88% on room air requiring supplemental O2.  Concern for pneumonia.  Will start IV fluids and antibiotics while checking a broad lab panel.  Received 500 mL IV fluids prior to arrival by EMS.  Clinical Course as of 02/04/21 2146  Sat Feb 04, 2021  1825 Worsening hypotension - septic shock. Only has 777ml NS infused so far. Will start levophed.  [PS]  1952 Goals of care discussed with pt's wife Michaeljoseph Revolorio, who confirms no intubation, requests DNR code status. Would not want ICU care or pressor use.  In light of current situation of shock and kidney failure, she agrees with IVF and abx but also just requests that he be kept comfortable. She understands that he is severely ill and may die at any time. [PS]  1957 Repeat lactate normalized.   [PS]    Clinical Course User Index [PS] Carrie Mew, MD     ____________________________________________   FINAL CLINICAL IMPRESSION(S) / ED DIAGNOSES    Final diagnoses:  Septic shock ([place])  Acute renal failure, unspecified acute renal failure type [facility])     ED Discharge Orders     None       Portions of this note were generated with dragon dictation software. Dictation errors may occur despite best attempts at proofreading.   Carrie Mew, MD 02/04/21 2146

## 2021-02-04 NOTE — ED Triage Notes (Signed)
EMS was called for congestion for a week, they states his oxygen saturations were 88-90% on room air. Also states patient hasn't taken any medications, refused food and water for the last 3 days.

## 2021-02-04 NOTE — H&P (Signed)
History and Physical   Steven Ingram PFX:[id number] DOB: [birth year] DOA: 02/04/2021  Referring MD/NP/PA: Dr. Joni Fears  PCP: [facility], [facility]   Outpatient Specialists: Dr. Manuella Ghazi, neurology at [facility]  Patient coming from: Home  Chief Complaint: Generalized weakness and fever  HPI: Steven Ingram is a 70 y.o. male with medical history significant of coronary artery disease, advanced diastolic heart failure, dementia, diabetes, encephalopathy, history of CVA, hyperlipidemia, essential hypertension, severe malnutrition, recurrent encephalopathy who was brought in from home with severe generalized weakness cough fever and altered mental status.  Symptoms have been going on apparently for a few days.  Patient is being followed by palliative care.  Family reported gradual decline over the last few weeks.  He is not eating or drinking.  He is currently confused literally obtunded.  Not able to communicate.  Family have been contacted.  Patient found to be in severe sepsis with hypotension.  They have opted for nonaggressive care.  If patient is not improving with visit care they want to go comfort care.  Patient is therefore being admitted to the medical floor for treatment of sepsis due to pneumonia.  ED Course: Temperature is 97.2, blood pressure 79/49 pulse 190 respirate of 36 oxygen sat 95% on room air.  Sodium 146 potassium 6.1 chloride 112 CO2 18 glucose 230 BUN 118 creatinine 5.52 calcium 8.7 with a gap of 16.  AST ALT ALT elevated lactic acid 2.4.  White count 12.4 hemoglobin 8.6 and platelets 412.  COVID-19 and influenza negative.  Chest x-ray shows no acute cardiopulmonary disease.  Patient being admitted with severe sepsis and UTI.  Review of Systems: As per HPI otherwise 10 point review of systems negative.    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   High cholesterol    History of CVA (cerebrovascular accident) 07/19/2019   HLD (hyperlipidemia) 03/31/2020   HTN (hypertension) 07/19/2019   Hypertension    Hypokalemia    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 He has a 53.00 pack-year smoking history. He has never used smokeless tobacco. He reports current alcohol use. He reports that he does not use drugs.  Allergies  Allergen Reactions   Ciprofloxacin Other (See Comments)    Possibly contributed to seizure-like activity resulting in 06/15/20 admission  Other reaction(s): Other (See Comments) Possibly contributed to seizure-like activity resulting in 06/15/20 admission     Family History  Problem Relation Age of Onset   Diabetes Mellitus II Mother    Hypertension Mother    Diabetes Mellitus II Father    Hypertension Father    Prostate cancer Neg Hx    Bladder Cancer Neg  Hx    Kidney cancer Neg Hx      Prior to Admission medications   Medication Sig Start Date End Date Taking? Authorizing Provider  atorvastatin (LIPITOR) 80 MG tablet Take 80 mg by mouth daily.    [provider]  Calcium Carbonate Antacid (CALCIUM CARBONATE, DOSED IN MG ELEMENTAL  CALCIUM,) 1250 MG/5ML SUSP Take 5 mLs (500 mg of elemental calcium total) by mouth every 6 (six) hours as needed for indigestion. 12/16/20   Danford, Suann Larry, MD  clopidogrel (PLAVIX) 75 MG tablet Take 1 tablet (75 mg total) by mouth daily. 07/21/19   Jennye Boroughs, MD  feeding supplement, GLUCERNA SHAKE, (GLUCERNA SHAKE) LIQD Take 237 mLs by mouth 3 (three) times daily between meals. 12/16/20   Danford, Suann Larry, MD  lisinopril (ZESTRIL) 40 MG tablet Take 40 mg by mouth daily. 01/27/21   [provider]  losartan (COZAAR) 100 MG tablet Take 100 mg by mouth daily. 01/03/21   [provider]  metoprolol tartrate (LOPRESSOR) 50 MG tablet Take 1 tablet (50 mg total) by mouth daily. Patient not taking: No sig reported 06/21/20 06/21/21  Sharen Hones, MD  risperiDONE (RISPERDAL) 0.25 MG tablet Take 0.25 mg by mouth daily. Patient not taking: No sig reported 05/25/20   [provider]  risperiDONE (RISPERDAL) 0.5 MG tablet Take 0.5 mg by mouth daily at 12 noon. 01/18/21   [provider]  risperiDONE (RISPERDAL) 1 MG tablet Take 1 mg by mouth at bedtime. 12/06/20   [provider]  sertraline (ZOLOFT) 25 MG tablet Take 25 mg by mouth daily.    [provider]  tamsulosin (FLOMAX) 0.4 MG CAPS capsule Take 0.4 mg by mouth 2 (two) times daily.    [provider]    Physical Exam: Vitals:   02/04/21 1913 02/04/21 1927 02/04/21 2013 02/04/21 2019  BP: (!) 89/57 100/63 107/60 (!) 95/58  Pulse: 97 96 (!) 119 94  Resp: (!) 34 (!) 23 (!) 29 (!) 21  Temp:      TempSrc:      SpO2: 99% 98% 98% 98%  Weight:          Constitutional: Chronically ill looking, malnourished, no distress Vitals:   02/04/21 1913 02/04/21 1927 02/04/21 2013 02/04/21 2019  BP: (!) 89/57 100/63 107/60 (!) 95/58  Pulse: 97 96 (!) 119 94  Resp: (!) 34 (!) 23 (!) 29 (!) 21  Temp:      TempSrc:      SpO2: 99% 98% 98% 98%  Weight:       Eyes: PERRL, lids and  conjunctivae normal ENMT: Mucous membranes are dry posterior pharynx clear of any exudate or lesions.Normal dentition.  Neck: normal, supple, no masses, no thyromegaly Respiratory: clear to auscultation bilaterally, no wheezing, no crackles. Normal respiratory effort. No accessory muscle use.  Cardiovascular: Sinus tachycardia no murmurs / rubs / gallops. No extremity edema. 2+ pedal pulses. No carotid bruits.  Abdomen: no tenderness, no masses palpated. No hepatosplenomegaly. Bowel sounds positive.  Musculoskeletal: no clubbing / cyanosis. No joint deformity upper and lower extremities. Good ROM, no contractures. Normal muscle tone.  Skin: no rashes, lesions, ulcers. No induration Neurologic: CN 2-12 grossly intact. Sensation intact, DTR normal. Strength 5/5 in all 4.  Psychiatric: Confused not communicating    Labs on Admission: I have personally reviewed following labs and imaging studies  CBC: Recent Labs  Lab 02/04/21 1724  WBC 12.9*  NEUTROABS 10.0*  HGB 8.6*  HCT 27.9*  MCV 106.1*  PLT 578*   Basic Metabolic Panel: Recent Labs  Lab 02/04/21 1724  NA 146*  K 6.1*  CL 112*  CO2 18*  GLUCOSE 230*  BUN 118*  CREATININE 5.52*  CALCIUM 8.7*   GFR: Estimated Creatinine Clearance: 12.3 mL/min (A) (by C-G formula based on SCr of 5.52 mg/dL (H)). Liver Function Tests: Recent Labs  Lab 02/04/21 1724  AST 84*  ALT 71*  ALKPHOS 49  BILITOT 1.4*  PROT 6.8  ALBUMIN 2.2*   No results for input(s): LIPASE, AMYLASE in the last 168 hours. No results for input(s): AMMONIA in the last 168 hours. Coagulation Profile: Recent Labs  Lab 02/04/21 1724  INR 1.3*   Cardiac Enzymes: No results for input(s): CKTOTAL, CKMB, CKMBINDEX, TROPONINI in the last 168 hours. BNP (last 3 results) No results for input(s): PROBNP in the last 8760 hours. HbA1C: No results for input(s): HGBA1C in the last 72 hours. CBG: No results for input(s): GLUCAP in the last 168 hours. Lipid  Profile: No results for input(s): CHOL, HDL, LDLCALC, TRIG, CHOLHDL, LDLDIRECT in the last 72 hours. Thyroid Function Tests: No results for input(s): TSH, T4TOTAL, FREET4, T3FREE, THYROIDAB in the last 72 hours. Anemia Panel: No results for input(s): VITAMINB12, FOLATE, FERRITIN, TIBC, IRON, RETICCTPCT in the last 72 hours. Urine analysis:    Component Value Date/Time   COLORURINE AMBER (A) 12/11/2020 2217   APPEARANCEUR CLOUDY (A) 12/11/2020 2217   LABSPEC 1.023 12/11/2020 2217   PHURINE 5.0 12/11/2020 2217   GLUCOSEU >=500 (A) 12/11/2020 2217   HGBUR NEGATIVE 12/11/2020 2217   [place] NEGATIVE 12/11/2020 2217   KETONESUR NEGATIVE 12/11/2020 2217   PROTEINUR >=300 (A) 12/11/2020 2217   NITRITE NEGATIVE 12/11/2020 2217   LEUKOCYTESUR NEGATIVE 12/11/2020 2217   Sepsis Labs: @LABRCNTIP (procalcitonin:4,lacticidven:4) ) Recent Results (from the past 240 hour(s))  Resp Panel by RT-PCR (Flu A&B, Covid) Nasopharyngeal Swab     Status: None   Collection Time: 02/04/21  5:24 PM   Specimen: Nasopharyngeal Swab; Nasopharyngeal(NP) swabs in vial transport medium  Result Value Ref Range Status   SARS Coronavirus 2 by RT PCR NEGATIVE NEGATIVE Final    Comment: (NOTE) SARS-CoV-2 target nucleic acids are NOT DETECTED.  The SARS-CoV-2 RNA is generally detectable in upper respiratory specimens during the acute phase of infection. The lowest concentration of SARS-CoV-2 viral copies this assay can detect is 138 copies/mL. A negative result does not preclude SARS-Cov-2 infection and should not be used as the sole basis for treatment or other patient management decisions. A negative result may occur with  improper specimen collection/handling, submission of specimen other than nasopharyngeal swab, presence of viral mutation(s) within the areas targeted by this assay, and inadequate number of viral copies(<138 copies/mL). A negative result must be combined with clinical observations, patient  history, and epidemiological information. The expected result is Negative.  Fact Sheet for Patients:  EntrepreneurPulse.com.au  Fact Sheet for Healthcare Providers:  IncredibleEmployment.be  This test is no t yet approved or cleared by the [place] FDA and  has been authorized for detection and/or diagnosis of SARS-CoV-2 by FDA under an Emergency Use Authorization (EUA). This EUA will remain  in effect (meaning this test can be used) for the duration of the COVID-19 declaration under Section 564(b)(1) of the Act, 21 U.S.C.section 360bbb-3(b)(1), unless the authorization is terminated  or revoked sooner.       Influenza A by PCR NEGATIVE NEGATIVE Final   Influenza B by PCR NEGATIVE NEGATIVE Final  Comment: (NOTE) The Xpert Xpress SARS-CoV-2/FLU/RSV plus assay is intended as an aid in the diagnosis of influenza from Nasopharyngeal swab specimens and should not be used as a sole basis for treatment. Nasal washings and aspirates are unacceptable for Xpert Xpress SARS-CoV-2/FLU/RSV testing.  Fact Sheet for Patients: EntrepreneurPulse.com.au  Fact Sheet for Healthcare Providers: IncredibleEmployment.be  This test is not yet approved or cleared by the [place] FDA and has been authorized for detection and/or diagnosis of SARS-CoV-2 by FDA under an Emergency Use Authorization (EUA). This EUA will remain in effect (meaning this test can be used) for the duration of the COVID-19 declaration under Section 564(b)(1) of the Act, 21 U.S.C. section 360bbb-3(b)(1), unless the authorization is terminated or revoked.  Performed at [facility], [address] [place], [place] [address]      Radiological Exams on Admission: DG Chest [facility] 1 View  Result Date: 02/04/2021 CLINICAL DATA:  Questionable sepsis - evaluate for abnormality EXAM: PORTABLE CHEST 1 VIEW COMPARISON:  December 03, 2020  FINDINGS: The cardiomediastinal silhouette is unchanged in contour. No pleural effusion. No pneumothorax. No acute pleuroparenchymal abnormality. Surgical clips project over the upper abdomen. Multilevel degenerative changes of the thoracic spine. IMPRESSION: No acute cardiopulmonary abnormality. Electronically Signed   By: Valentino Saxon M.D.   On: 02/04/2021 17:19    EKG: Independently reviewed.  Sinus tachycardia  Assessment/Plan Principal Problem:   Sepsis ([place]) Active Problems:   CAD (coronary artery disease)   Acute metabolic encephalopathy   Acute kidney injury superimposed on CKD (H[facility])   Chronic diastolic CHF (congestive heart failure) (H[facility])   Dementia without behavioral disturbance ([place])   Hyperlipidemia associated with type 2 diabetes mellitus ([place])     #1 severe sepsis: No obvious source.  Suspected pneumonia but no clear finding on chest x-ray.  Generalized decline.  Patient will be admitted and continue with sepsis protocol with IV antibiotics and fluids.  He is a DNR/DNI.  If no improvement in the next 24 to 48 hours we will proceed with full comfort care.  #2 hyponatremia: Most likely due to poor oral intake.  Continue to encourage fluid intake and hydrate.  #3 chronic diastolic CHF: Compensated.  Patient intravascularly dry.  Follow sepsis protocol.  #4 hyperkalemia: Associated with AKI on CKD.  Initial treatment in the ER.  If persistent we may have to initiate Lokelma if patient is able to take p.o.  #5 diabetes: Initiate sliding scale insulin.  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  Continue to monitor  #8 dementia: Advanced.  Continue close monitoring  #9 normocytic anemia: Probably chronic disease.  H&H at 8.  No transfusion.  #10 disposition: Family's wishes are patient should be DNR/DNI.  Comfort measures will be initiated if no response to initial measures.  Current procalcitonin is  2.27.   DVT prophylaxis: Lovenox Code Status: DNR/DNI Family Communication: Family over the phone by ER Disposition Plan: To be determined  Consults called: None Admission status: Inpatient  Severity of Illness: The appropriate patient status for this patient is INPATIENT. Inpatient status is judged to be reasonable and necessary in order to provide the required intensity of service to ensure the patient's safety. The patient's presenting symptoms, physical exam findings, and initial radiographic and laboratory data in the context of their chronic comorbidities is felt to place them at high risk for further clinical deterioration. Furthermore, it is not anticipated that the patient will  be medically stable for discharge from the hospital within 2 midnights of admission.   * I certify that at the point of admission it is my clinical judgment that the patient will require inpatient hospital care spanning beyond 2 midnights from the point of admission due to high intensity of service, high risk for further deterioration and high frequency of surveillance required.Barbette Merino MD [facility] Pager [phone]  If 7PM-7AM, please contact night-coverage [email or web address] Password [facility]  02/04/2021, 8:47 PM

## 2021-02-04 NOTE — ED Notes (Signed)
Levophed started for persistent hypotension. Patients respirations have went from > 30 to 10-15, respirations are shallow. MD notified and to the bedside.

## 2021-02-04 NOTE — Consult Note (Signed)
CODE SEPSIS - PHARMACY COMMUNICATION  **Broad Spectrum Antibiotics should be administered within 1 hour of Sepsis diagnosis**  Time Code Sepsis Called/Page Received: 1655  Antibiotics Ordered: 3953  Time of 1st antibiotic administration: 1740  Additional action taken by pharmacy: N/A  If necessary, Name of Provider/Nurse Contacted: N/A    Darnelle Bos ,PharmD Clinical Pharmacist  02/04/2021  5:10 PM

## 2021-02-05 ENCOUNTER — Other Ambulatory Visit: Payer: Self-pay

## 2021-02-05 DIAGNOSIS — J690 Pneumonitis due to inhalation of food and vomit: Secondary | ICD-10-CM | POA: Diagnosis not present

## 2021-02-05 DIAGNOSIS — N178 Other acute kidney failure: Secondary | ICD-10-CM | POA: Diagnosis not present

## 2021-02-05 DIAGNOSIS — I69991 Dysphagia following unspecified cerebrovascular disease: Secondary | ICD-10-CM

## 2021-02-05 DIAGNOSIS — A419 Sepsis, unspecified organism: Secondary | ICD-10-CM | POA: Diagnosis not present

## 2021-02-05 DIAGNOSIS — E1165 Type 2 diabetes mellitus with hyperglycemia: Secondary | ICD-10-CM

## 2021-02-05 DIAGNOSIS — R627 Adult failure to thrive: Secondary | ICD-10-CM

## 2021-02-05 DIAGNOSIS — E870 Hyperosmolality and hypernatremia: Secondary | ICD-10-CM

## 2021-02-05 DIAGNOSIS — N184 Chronic kidney disease, stage 4 (severe): Secondary | ICD-10-CM

## 2021-02-05 DIAGNOSIS — E875 Hyperkalemia: Secondary | ICD-10-CM

## 2021-02-05 LAB — GLUCOSE, CAPILLARY: Glucose-Capillary: 260 mg/dL — ABNORMAL HIGH (ref 70–99)

## 2021-02-05 MED ORDER — ONDANSETRON HCL 4 MG/2ML IJ SOLN: 4.0000 mg | Freq: Four times a day (QID) | INTRAMUSCULAR | Status: DC | PRN

## 2021-02-05 MED ORDER — DEXTROSE 5 % IV SOLN: INTRAVENOUS | Status: DC

## 2021-02-05 MED ORDER — LORAZEPAM 2 MG/ML IJ SOLN: 1.0000 mg | INTRAMUSCULAR | Status: DC | PRN

## 2021-02-05 MED ORDER — LORAZEPAM 2 MG/ML PO CONC: 1.0000 mg | ORAL | Status: DC | PRN

## 2021-02-05 MED ORDER — LORAZEPAM 1 MG PO TABS: 1.0000 mg | ORAL_TABLET | ORAL | Status: DC | PRN

## 2021-02-05 MED ORDER — SODIUM CHLORIDE 0.9 % IV SOLN: 2.0000 g | INTRAVENOUS | Status: DC

## 2021-02-05 MED ORDER — OXYCODONE HCL 5 MG PO TABS: 5.0000 mg | ORAL_TABLET | ORAL | Status: DC | PRN

## 2021-02-05 MED ORDER — LACTATED RINGERS IV SOLN: INTRAVENOUS | Status: DC

## 2021-02-05 MED ORDER — SODIUM CHLORIDE 0.9 % IV SOLN: 500.0000 mg | INTRAVENOUS | Status: DC

## 2021-02-05 MED ORDER — FLEET ENEMA 7-19 GM/118ML RE ENEM: 1.0000 | ENEMA | Freq: Every day | RECTAL | Status: DC | PRN

## 2021-02-05 MED ORDER — ONDANSETRON HCL 4 MG PO TABS: 4.0000 mg | ORAL_TABLET | Freq: Four times a day (QID) | ORAL | Status: DC | PRN

## 2021-02-05 MED ORDER — MORPHINE SULFATE (PF) 2 MG/ML IV SOLN
1.0000 mg | INTRAVENOUS | Status: DC | PRN
  Administered 2021-02-05 – 2021-02-06 (×2): 1 mg via INTRAVENOUS
  Filled 2021-02-05 (×2): qty 1

## 2021-02-05 MED ORDER — TRAZODONE HCL 50 MG PO TABS: 25.0000 mg | ORAL_TABLET | Freq: Every evening | ORAL | Status: DC | PRN

## 2021-02-05 MED ORDER — SODIUM BICARBONATE 4.2 % IV SOLN: 25.0000 meq | Freq: Once | INTRAVENOUS | Status: DC

## 2021-02-05 MED ORDER — HEPARIN SODIUM (PORCINE) 5000 UNIT/ML IJ SOLN
5000.0000 [IU] | Freq: Three times a day (TID) | INTRAMUSCULAR | Status: DC
  Administered 2021-02-05: 5000 [IU] via SUBCUTANEOUS
  Filled 2021-02-05: qty 1

## 2021-02-05 MED ORDER — SODIUM CHLORIDE 0.9 % IV SOLN: 1.0000 g | Freq: Once | INTRAVENOUS | Status: DC

## 2021-02-05 NOTE — Progress Notes (Signed)
CSW contacted [facility] after speaking with Vicente Males [phone]). Vicente Males stated she would get patients information over to the liaison with [facility]. CSW called back to verify all information was received. CSW was told Horris Latino with [facility] would contact CSW back.

## 2021-02-05 NOTE — Progress Notes (Signed)
CSW contacted [facility] hospice and was told someone would reach out to CSW about patient.

## 2021-02-05 NOTE — Progress Notes (Signed)
CSW received a call from Santiago Glad [phone]) at [place] who is currently working on patients referral. Santiago Glad told CSW she has enough information to send to the doctor to see if he will be approved for residential hospice. CSW was told if approved they will reach out to the family and discuss consents. Patient could possibly discharge to hospice tonight or tomorrow.

## 2021-02-05 NOTE — Progress Notes (Signed)
CSW spoke with Santiago Glad to let her know that there will not be any social workers or case management staff tonight to coordinate transfer to hospice if patient is given a bed offer tonight.

## 2021-02-05 NOTE — Progress Notes (Signed)
CSW spoke with patients wife to let her know CSW contacted [facility] to setup residential hospice. CSW explained to patients wife that someone would reach to CSW.

## 2021-02-05 NOTE — Consult Note (Signed)
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  Inpatient Hospice consult is being initiated by Medical Provider with permission of family (patient's wife). Wound type:pressure, skin changes at life's end (SCALE) Pressure Injury POA: Yes Measurement:To be obtained by Bedside RN today and documented on Nursing Flow Sheet (LxWxD in cm) Wound bed:  sacral: red, pink with yellow tissue Left heel: black and yellow tissue Drainage (amount, consistency, odor) moderate from heel, small from sacrum Periwound: intact, erythematous Dressing procedure/placement/frequency: I have provided patient with an air mattress and turning and repositioning is in place. Flotation of heels is initiated. Wound care will be with twice daily saline moistened gauze dressings topped with dry gauze, ABD and secured with paper tape at the sacrum and Kerlix roll gauze/paper tape at the heel  [place] nursing team will not follow, but will remain available to this patient, the nursing and medical teams.  Please re-consult if needed. Thanks, Maudie Flakes, MSN, RN, [place], Arther Abbott  Pager# [phone]

## 2021-02-05 NOTE — Plan of Care (Addendum)
Handoff report from ED received. Patient alert only to self. Head to toe assessment done. Left sided weakness and deficits noted. Patient have decubitus ulcer on the sacral area and left heel. New dressing applied. Hob @ 35 degree. Crackles noted. MD notified to order for breathing tx. Patient on continuous pulse ox @ 4 l/[place]. Saturating at 100 %. Patient hemodynamically stable but does not look good.  Continue poc/ hourly rounding. Problem: Fluid Volume: Goal: Hemodynamic stability will improve Outcome: Progressing   Problem: Clinical Measurements: Goal: Diagnostic test results will improve Outcome: Progressing Goal: Signs and symptoms of infection will decrease Outcome: Progressing   Problem: Respiratory: Goal: Ability to maintain adequate ventilation will improve Outcome: Progressing   Problem: Clinical Measurements: Goal: Diagnostic test results will improve Outcome: Progressing   Problem: Fluid Volume: Goal: Hemodynamic stability will improve Outcome: Progressing   Problem: Clinical Measurements: Goal: Signs and symptoms of infection will decrease Outcome: Progressing   Problem: Respiratory: Goal: Ability to maintain adequate ventilation will improve Outcome: Progressing

## 2021-02-05 NOTE — Progress Notes (Signed)
PROGRESS NOTE    Steven Ingram  YYQ:[id number] DOB: Oct 31, 1950 DOA: 02/04/2021 PCP: [facility] [employer]   Chief complaint: general weakness and fever. Brief Narrative:   Steven Ingram is a 70 y.o. male with medical history significant of coronary artery disease, advanced diastolic heart failure, dementia, diabetes, encephalopathy, history of CVA, hyperlipidemia, essential hypertension, severe malnutrition, recurrent encephalopathy who was brought in from home with severe generalized weakness cough fever and altered mental status. Patient met severe sepsis criteria with hypotension, significant tachycardia, tachypnea, leukocytosis.  Patient also had acute on chronic renal failure.  Initially placed on antibiotics. After long discussion with POA, wife, transition to comfort care.  Assessment & Plan:   Principal Problem:   Sepsis ([place]) Active Problems:   CAD (coronary artery disease)   Malnutrition of moderate degree   Acute metabolic encephalopathy   Acute renal failure superimposed on stage 4 chronic kidney disease (HCC)   Chronic diastolic CHF (congestive heart failure) (HCC)   Dementia without behavioral disturbance (HCC)   Hyperlipidemia associated with type 2 diabetes mellitus (HCC)   Cognitive deficit, post-stroke   History of stroke with current residual effects   Failure to thrive in adult   Hyperkalemia   Hypernatremia   Aspiration pneumonia of both lower lobes due to gastric secretions ([place])   Uncontrolled type 2 diabetes mellitus with hyperglycemia ([place])   Dysphagia as late effect of cerebrovascular disease  I have a long discussion with his wife, patient has been declining for the last few weeks, he has not been eating and drinking.  He has significant dysphagia, aspirating.  He developed sepsis most likely due to aspiration pneumonia. At this point, patient appears to be terminal.  After discussion with wife, decision was made to transition to comfort care.   Will obtain inpatient hospice consult.    Subjective: Patient is not responding, not opening eyes to command. He had a respite distress after giving fluids.  Fluids discontinued. Currently, he seems to be comfortable.  Objective: Vitals:   02/05/21 0110 02/05/21 0230 02/05/21 0408 02/05/21 0759  BP: 108/66 112/78 108/61 (!) 112/55  Pulse:   88 88  Resp:   20 20  Temp:   98.1 F (36.7 C) 98.8 F (37.1 C)  TempSrc:   Oral   SpO2: 95% 100% 98% 100%  Weight:      Height:        Intake/Output Summary (Last 24 hours) at 02/05/2021 1037 Last data filed at 02/05/2021 0700 Gross per 24 hour  Intake 987.53 ml  Output 720 ml  Net 267.53 ml   Filed Weights   02/04/21 1721 02/04/21 2140 02/05/21 0055  Weight: 71.6 kg 65.9 kg 65.9 kg    Examination:  General exam: Ill-appearing, unresponsive Respiratory system: Some crackles in bases. Respiratory effort normal. Cardiovascular system: S1 & S2 heard, RRR. No JVD, murmurs, rubs, gallops or clicks. No pedal edema. Gastrointestinal system: Abdomen is nondistended, soft and nontender. No organomegaly or masses felt. Normal bowel sounds heard. Central nervous system: Not responsive to command. Extremities: Symmetric  Skin: No rashes, lesions or ulcers     Data Reviewed: I have personally reviewed following labs and imaging studies  CBC: Recent Labs  Lab 02/04/21 1724  WBC 12.9*  NEUTROABS 10.0*  HGB 8.6*  HCT 27.9*  MCV 106.1*  PLT 888*   Basic Metabolic Panel: Recent Labs  Lab 02/04/21 1724  NA 146*  K 6.1*  CL 112*  CO2 18*  GLUCOSE 230*  BUN 118*  CREATININE 5.52*  CALCIUM 8.7*   GFR: Estimated Creatinine Clearance: 11.4 mL/min (A) (by C-G formula based on SCr of 5.52 mg/dL (H)). Liver Function Tests: Recent Labs  Lab 02/04/21 1724  AST 84*  ALT 71*  ALKPHOS 49  BILITOT 1.4*  PROT 6.8  ALBUMIN 2.2*   No results for input(s): LIPASE, AMYLASE in the last 168 hours. No results for input(s): AMMONIA  in the last 168 hours. Coagulation Profile: Recent Labs  Lab 02/04/21 1724  INR 1.3*   Cardiac Enzymes: No results for input(s): CKTOTAL, CKMB, CKMBINDEX, TROPONINI in the last 168 hours. BNP (last 3 results) No results for input(s): PROBNP in the last 8760 hours. HbA1C: No results for input(s): HGBA1C in the last 72 hours. CBG: Recent Labs  Lab 02/05/21 0433  GLUCAP 260*   Lipid Profile: No results for input(s): CHOL, HDL, LDLCALC, TRIG, CHOLHDL, LDLDIRECT in the last 72 hours. Thyroid Function Tests: No results for input(s): TSH, T4TOTAL, FREET4, T3FREE, THYROIDAB in the last 72 hours. Anemia Panel: No results for input(s): VITAMINB12, FOLATE, FERRITIN, TIBC, IRON, RETICCTPCT in the last 72 hours. Sepsis Labs: Recent Labs  Lab 02/04/21 1724 02/04/21 1900  PROCALCITON 2.27  --   LATICACIDVEN 2.4* 1.9    Recent Results (from the past 240 hour(s))  Resp Panel by RT-PCR (Flu A&B, Covid) Nasopharyngeal Swab     Status: None   Collection Time: 02/04/21  5:24 PM   Specimen: Nasopharyngeal Swab; Nasopharyngeal(NP) swabs in vial transport medium  Result Value Ref Range Status   SARS Coronavirus 2 by RT PCR NEGATIVE NEGATIVE Final    Comment: (NOTE) SARS-CoV-2 target nucleic acids are NOT DETECTED.  The SARS-CoV-2 RNA is generally detectable in upper respiratory specimens during the acute phase of infection. The lowest concentration of SARS-CoV-2 viral copies this assay can detect is 138 copies/mL. A negative result does not preclude SARS-Cov-2 infection and should not be used as the sole basis for treatment or other patient management decisions. A negative result may occur with  improper specimen collection/handling, submission of specimen other than nasopharyngeal swab, presence of viral mutation(s) within the areas targeted by this assay, and inadequate number of viral copies(<138 copies/mL). A negative result must be combined with clinical observations, patient  history, and epidemiological information. The expected result is Negative.  Fact Sheet for Patients:  EntrepreneurPulse.com.au  Fact Sheet for Healthcare Providers:  IncredibleEmployment.be  This test is no t yet approved or cleared by the [place] FDA and  has been authorized for detection and/or diagnosis of SARS-CoV-2 by FDA under an Emergency Use Authorization (EUA). This EUA will remain  in effect (meaning this test can be used) for the duration of the COVID-19 declaration under Section 564(b)(1) of the Act, 21 U.S.C.section 360bbb-3(b)(1), unless the authorization is terminated  or revoked sooner.       Influenza A by PCR NEGATIVE NEGATIVE Final   Influenza B by PCR NEGATIVE NEGATIVE Final    Comment: (NOTE) The Xpert Xpress SARS-CoV-2/FLU/RSV plus assay is intended as an aid in the diagnosis of influenza from Nasopharyngeal swab specimens and should not be used as a sole basis for treatment. Nasal washings and aspirates are unacceptable for Xpert Xpress SARS-CoV-2/FLU/RSV testing.  Fact Sheet for Patients: EntrepreneurPulse.com.au  Fact Sheet for Healthcare Providers: IncredibleEmployment.be  This test is not yet approved or cleared by the [place] FDA and has been authorized for detection and/or diagnosis of SARS-CoV-2 by FDA under an Emergency Use Authorization (EUA). This EUA will remain  in effect (meaning this test can be used) for the duration of the COVID-19 declaration under Section 564(b)(1) of the Act, 21 U.S.C. section 360bbb-3(b)(1), unless the authorization is terminated or revoked.  Performed at [facility], [address] [place], [place] [address]   Blood Culture (routine x 2)     Status: None (Preliminary result)   Collection Time: 02/04/21  5:24 PM   Specimen: BLOOD  Result Value Ref Range Status   Specimen Description BLOOD RIGHT ANTECUBITAL  Final    Special Requests   Final    BOTTLES DRAWN AEROBIC AND ANAEROBIC Blood Culture adequate volume   Culture   Final    NO GROWTH < 24 HOURS Performed at [facility], [address], [place], [place] [address]    Report Status PENDING  Incomplete  Blood Culture (routine x 2)     Status: None (Preliminary result)   Collection Time: 02/04/21  5:25 PM   Specimen: BLOOD  Result Value Ref Range Status   Specimen Description BLOOD LEFT ANTECUBITAL  Final   Special Requests   Final    BOTTLES DRAWN AEROBIC AND ANAEROBIC Blood Culture results may not be optimal due to an excessive volume of blood received in culture bottles   Culture   Final    NO GROWTH < 24 HOURS Performed at [facility], [address] [place], [place] [address]    Report Status PENDING  Incomplete         Radiology Studies: DG Chest Port 1 View  Result Date: 02/04/2021 CLINICAL DATA:  Questionable sepsis - evaluate for abnormality EXAM: PORTABLE CHEST 1 VIEW COMPARISON:  December 03, 2020 FINDINGS: The cardiomediastinal silhouette is unchanged in contour. No pleural effusion. No pneumothorax. No acute pleuroparenchymal abnormality. Surgical clips project over the upper abdomen. Multilevel degenerative changes of the thoracic spine. IMPRESSION: No acute cardiopulmonary abnormality. Electronically Signed   By: Valentino Saxon M.D.   On: 02/04/2021 17:19        Scheduled Meds: Continuous Infusions:  sodium chloride Stopped (02/05/21 0828)     LOS: 1 day    Time spent: 32 minutes, more than 50% time involved in direct patient care.    Sharen Hones, MD [facility]   To contact the attending provider between 7A-7P or the covering provider during after hours 7P-7A, please log into the web site [email or web address] and access using universal [place] password for that web site. If you do not have the password, please call the hospital operator.  02/05/2021, 10:37 AM

## 2021-02-06 DIAGNOSIS — R6520 Severe sepsis without septic shock: Secondary | ICD-10-CM

## 2021-02-06 DIAGNOSIS — J690 Pneumonitis due to inhalation of food and vomit: Secondary | ICD-10-CM | POA: Diagnosis not present

## 2021-02-06 DIAGNOSIS — G9341 Metabolic encephalopathy: Secondary | ICD-10-CM | POA: Diagnosis not present

## 2021-02-06 DIAGNOSIS — N178 Other acute kidney failure: Secondary | ICD-10-CM | POA: Diagnosis not present

## 2021-02-06 DIAGNOSIS — A419 Sepsis, unspecified organism: Secondary | ICD-10-CM | POA: Diagnosis not present

## 2021-02-06 MED ORDER — LORAZEPAM 1 MG PO TABS: 1.0000 mg | ORAL_TABLET | ORAL | 0 refills | Status: AC | PRN

## 2021-02-06 MED ORDER — MORPHINE SULFATE (PF) 2 MG/ML IV SOLN: 1.0000 mg | INTRAVENOUS | 0 refills | Status: AC | PRN

## 2021-02-06 NOTE — Discharge Summary (Signed)
Physician Discharge Summary  Patient ID: Steven Ingram MRN: [id number] DOB/AGE: [birth year] 70 y.o.  Admit date: 02/04/2021 Discharge date: 02/06/2021  Admission Diagnoses:  Discharge Diagnoses:  Principal Problem:   Severe sepsis ([place]) Active Problems:   CAD (coronary artery disease)   Malnutrition of moderate degree   Acute metabolic encephalopathy   Acute renal failure superimposed on stage 4 chronic kidney disease (HCC)   Chronic diastolic CHF (congestive heart failure) (HCC)   Dementia without behavioral disturbance ([place])   Hyperlipidemia associated with type 2 diabetes mellitus (HCC)   Cognitive deficit, post-stroke   History of stroke with current residual effects   Failure to thrive in adult   Hyperkalemia   Hypernatremia   Aspiration pneumonia of both lower lobes due to gastric secretions ([place])   Uncontrolled type 2 diabetes mellitus with hyperglycemia ([place])   Dysphagia as late effect of cerebrovascular disease  Decubitus ulcer POA. 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     Discharged Condition: poor  Hospital Course:   Steven Ingram is a 70 y.o. male with medical history significant of coronary artery disease, advanced diastolic heart failure, dementia, diabetes, encephalopathy, history of CVA, hyperlipidemia, essential hypertension, severe malnutrition, recurrent encephalopathy who was brought in from home with severe generalized weakness cough fever and altered mental status. Patient met severe sepsis criteria with hypotension, significant tachycardia, tachypnea, leukocytosis.  Patient also had acute on chronic renal failure.  Initially placed on antibiotics. After long discussion with POA, wife, transition to comfort care. Has some respite distress requiring morphine, he could not clear his airway.  He will be transferred to inpatient hospice.  Consults: None  Significant Diagnostic Studies:   Treatments: comfort care  Discharge Exam: Blood  pressure (!) 112/55, pulse 88, temperature 98.8 F (37.1 C), resp. rate 20, height _0  (1.753 m), weight 65.9 kg, SpO2 100 %. General appearance: Unresponsive. Resp: Not able to clear airway with rhonchi. Cardio: regular rate and rhythm, S1, S2 normal, no murmur, click, rub or gallop GI: soft, non-tender; bowel sounds normal; no masses,  no organomegaly Extremities: extremities normal, atraumatic, no cyanosis or edema  Disposition: Discharge disposition: 51-Hospice/Medical Facility       Discharge Instructions     Diet general   Complete by: As directed    As tolerated   Discharge wound care:   Complete by: As directed    Hospice   Increase activity slowly   Complete by: As directed       Allergies as of 02/06/2021       Reactions   Ciprofloxacin Other (See Comments)   Possibly contributed to seizure-like activity resulting in 06/15/20 admission  Other reaction(s): Other (See Comments) Possibly contributed to seizure-like activity resulting in 06/15/20 admission         Medication List     STOP taking these medications    atorvastatin 80 MG tablet Commonly known as: LIPITOR   calcium carbonate (dosed in mg elemental calcium) 1250 MG/5ML Susp   clopidogrel 75 MG tablet Commonly known as: PLAVIX   feeding supplement (GLUCERNA SHAKE) Liqd   lisinopril 40 MG tablet Commonly known as: ZESTRIL   losartan 100 MG tablet Commonly known as: COZAAR   metoprolol tartrate 50 MG tablet Commonly known as: Lopressor   risperiDONE 0.25 MG tablet Commonly known as: RISPERDAL   risperiDONE 0.5 MG tablet Commonly known as: RISPERDAL   risperiDONE 1 MG tablet Commonly known as: RISPERDAL   sertraline 25 MG tablet Commonly known as: ZOLOFT  tamsulosin 0.4 MG Caps capsule Commonly known as: FLOMAX       TAKE these medications    LORazepam 1 MG tablet Commonly known as: ATIVAN Take 1 tablet (1 mg total) by mouth every 4 (four) hours as needed for anxiety.    morphine 2 MG/ML injection Inject 0.5 mLs (1 mg total) into the vein every 2 (two) hours as needed (or dyspnea).               Discharge Care Instructions  (From admission, onward)           Start     Ordered   02/06/21 0000  Discharge wound care:       Comments: Hospice   02/06/21 0947             Signed: Sharen Hones 02/06/2021, 9:47 AM

## 2021-02-06 NOTE — TOC Progression Note (Signed)
Transition of Care [facility]) - Progression Note    Patient Details  Name: Steven Ingram MRN: [id number] Date of Birth: June 21, 1950  Transition of Care [facility]) CM/SW [place], RN Phone Number: 02/06/2021, 9:36 AM  Clinical Narrative:   Patient has inpatient hospice bed at [facility] in [place], [place] as per Loren Racer.  Patient will transport at noon today by [facility].          Expected Discharge Plan and Services                                                 Social Determinants of Health (SDOH) Interventions    Readmission Risk Interventions Readmission Risk Prevention Plan 12/13/2020 10/04/2018  Post Dischage Appt - Not Complete  Medication Screening - Complete  Transportation Screening Complete Complete  PCP or Specialist Appt within 3-5 Days Complete -  HRI or Home Care Consult Complete -  Social Work Consult for Recovery Care Planning/Counseling Complete -  Palliative Care Screening Complete -  Medication Review Press photographer) Complete -

## 2021-02-06 NOTE — Progress Notes (Addendum)
[facility]) Hospital Liaison Note   Received request from Transitions of Care Manager Raina Mina, [place] for family interest in [place]. Spoke with patient's wife Steven Ingram to confirm interest and explain services. Patient chart and information reviewed by [facility] physician. Hospice Home eligibility confirmed.    Family agreeable to transfer today. Request for transport to be set up at 12 pm today. Dreama Saa, RN [facility] Manager aware.   RN please call report to [facility] at [phone] prior to patient leaving the unit.  Please send signed and completed DNR with patient at discharge.   Please do not hesitate to call with any hospice related questions.    Thank you for the opportunity to participate in this patient's care.   Bobbie "Loren Racer, RN, BSN [facility] Liaison [phone]

## 2021-02-09 LAB — CULTURE, BLOOD (ROUTINE X 2)
Culture: NO GROWTH
Culture: NO GROWTH
Special Requests: ADEQUATE

## 2021-02-14 DEATH — deceased

## 2021-04-25 ENCOUNTER — Ambulatory Visit: Payer: Medicare PPO | Admitting: Urology

## 2023-04-14 IMAGING — CT CT HEAD W/O CM
2 series · 16 of 40 positions shown, 20 images · non-contrast
Comparison: June 15, 2020.

CLINICAL DATA: Altered mental status.

EXAM:
CT HEAD WITHOUT CONTRAST
TECHNIQUE: Contiguous axial images were obtained from the base of the skull
through the vertex without intravenous contrast.

[Series 2: head wo · axial · 0.41mm/px · z∈[-136,-6]mm · 13 of 32 slices shown, 17 images]
[im 3/32  brain]
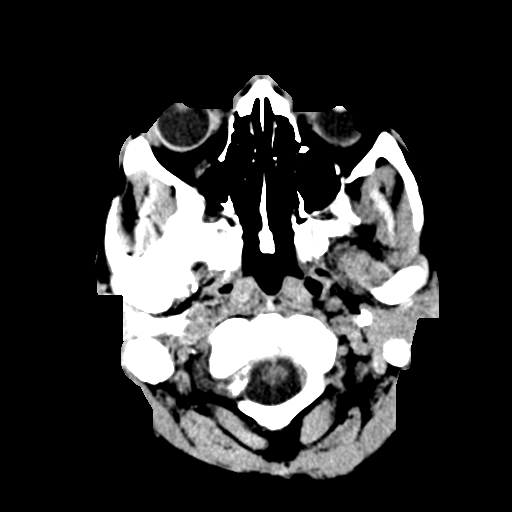
[im 3/32  bone]
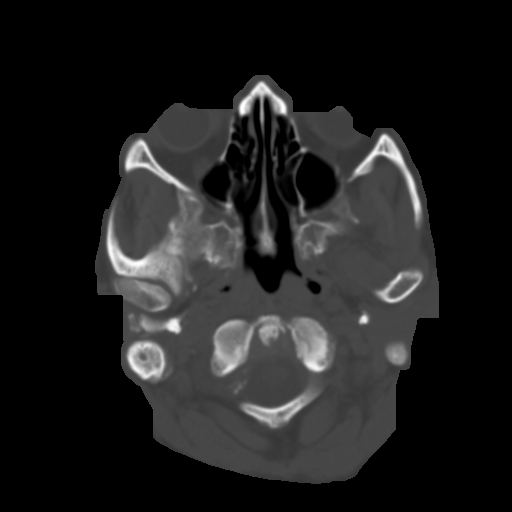
[im 5/32  brain]
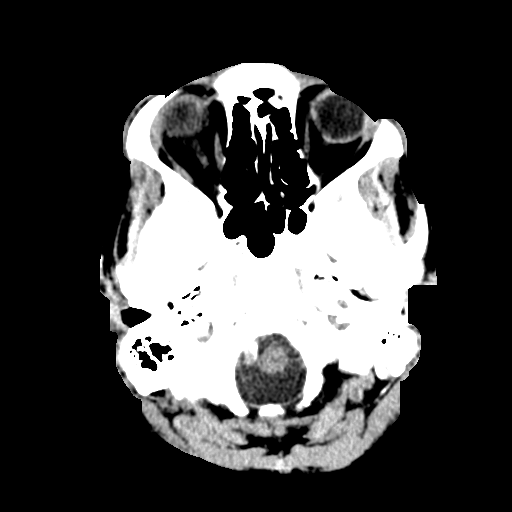
[im 7/32  brain]
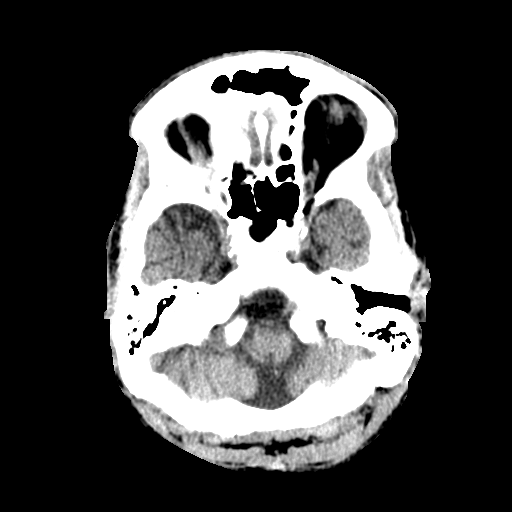
[im 9/32  brain]
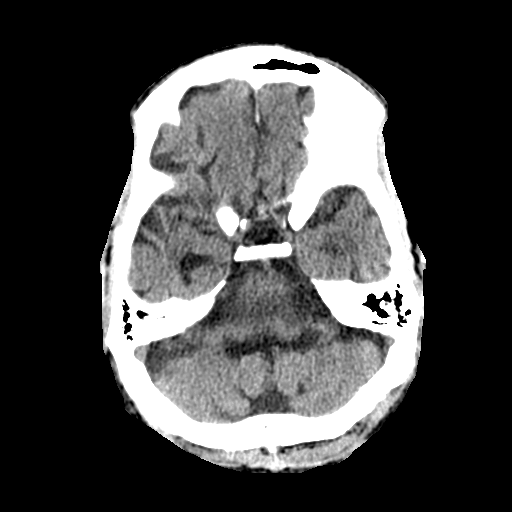
[im 11/32  brain]
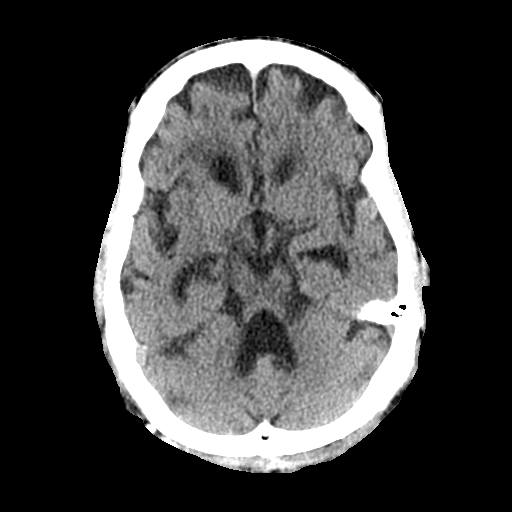
[im 11/32  bone]
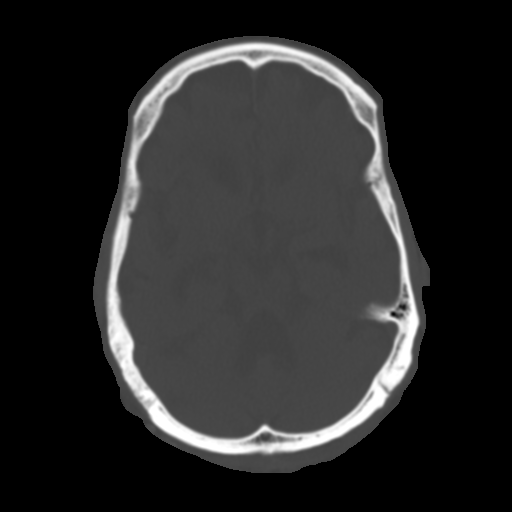
[im 13/32  brain]
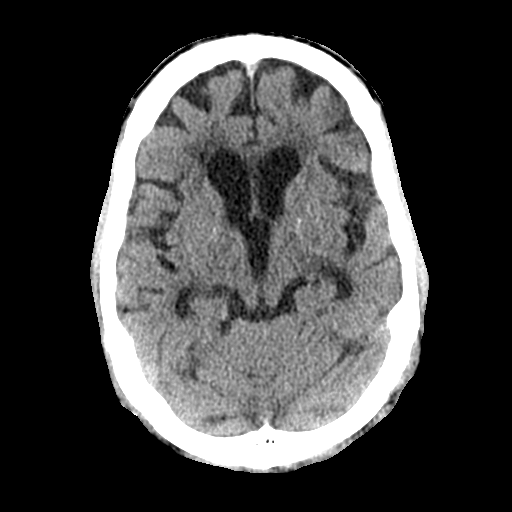
[im 17/32  brain]
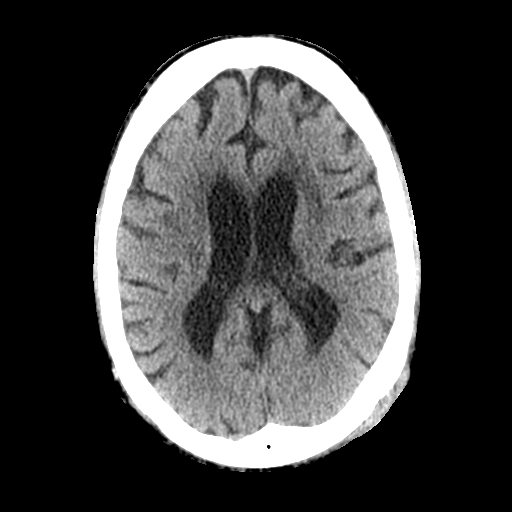
[im 19/32  brain]
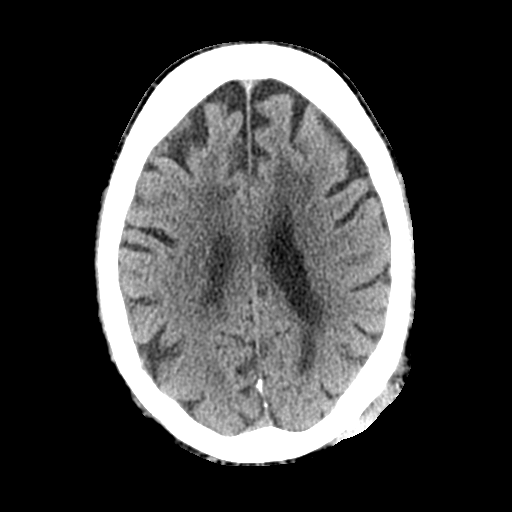
[im 21/32  brain]
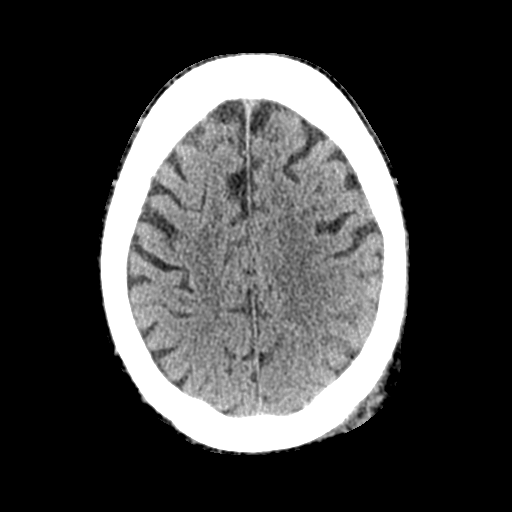
[im 21/32  bone]
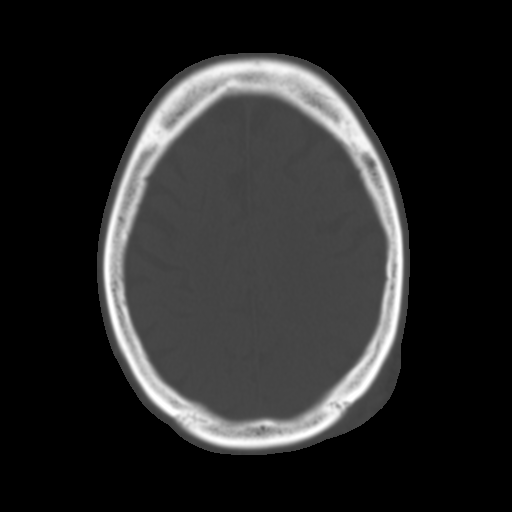
[im 23/32  brain]
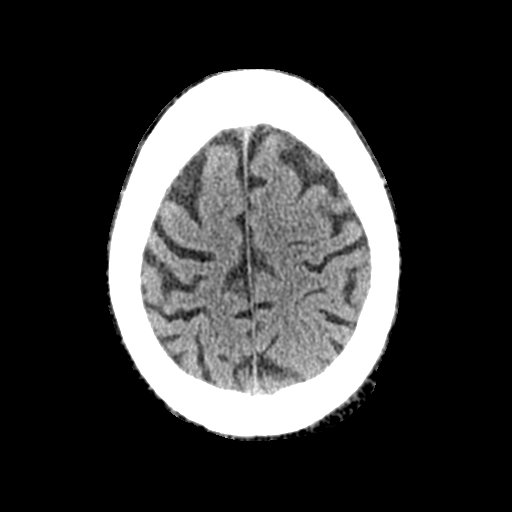
[im 25/32  brain]
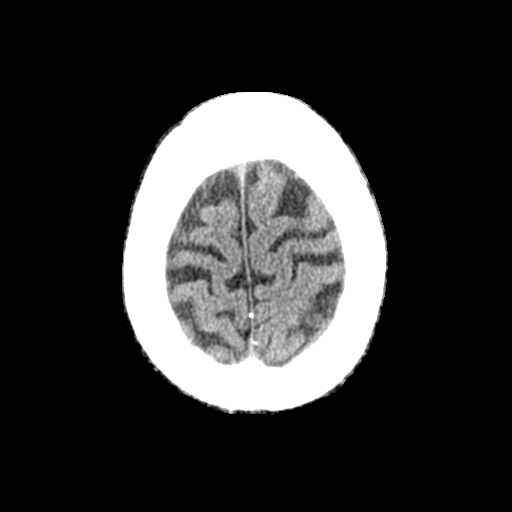
[im 27/32  brain]
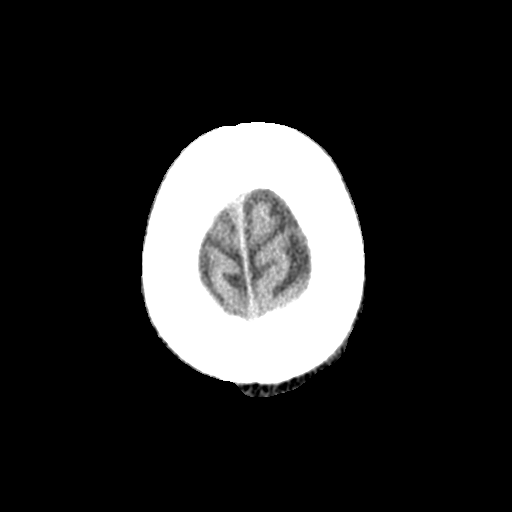
[im 29/32  brain]
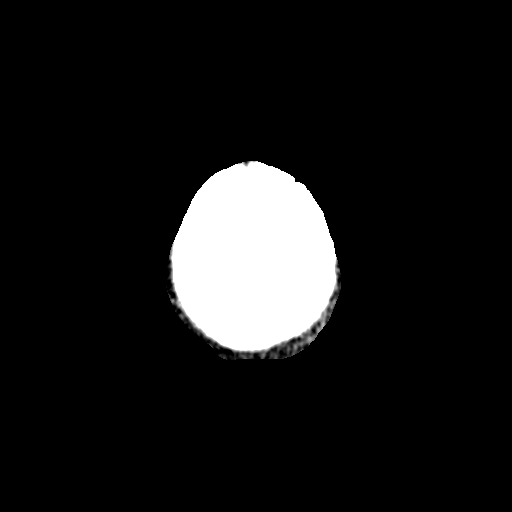
[im 29/32  bone]
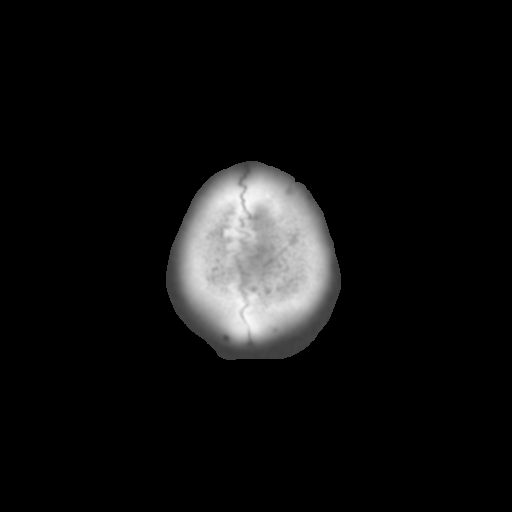

[Series 4: coronal soft tissue · coronal · 0.34mm/px · 3 of 63 slices shown]
[im 21/63  brain]
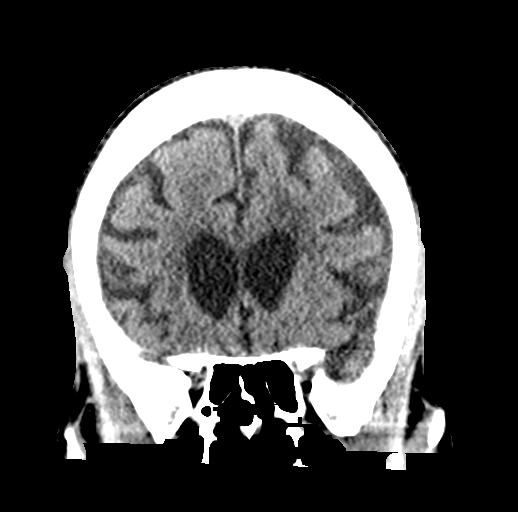
[im 28/63  brain]
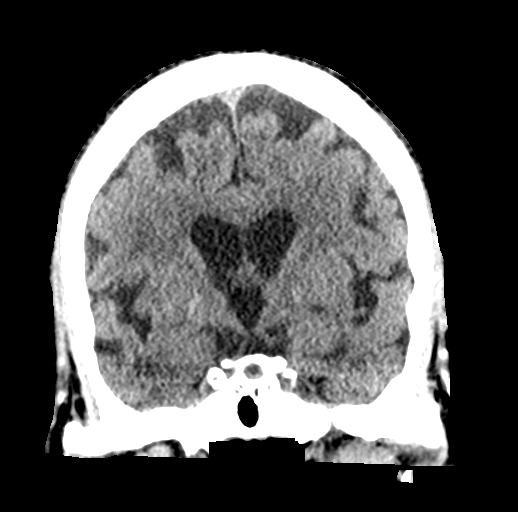
[im 35/63  brain]
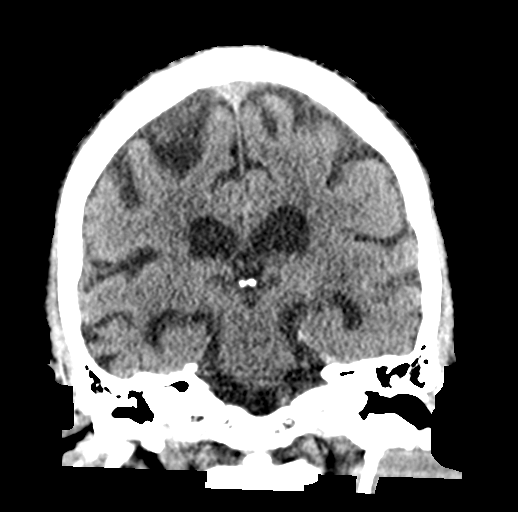

[16 of 40 positions shown; findings below may reference images not displayed]

FINDINGS: Brain: Mild diffuse cortical atrophy is noted. Mild chronic ischemic
white matter disease is noted. No mass effect or midline shift is
noted. Ventricular size is within normal limits. There is no
evidence of mass lesion, hemorrhage or acute infarction.

Vascular: No hyperdense vessel or unexpected calcification.

Skull: Normal. Negative for fracture or focal lesion.

Sinuses/Orbits: No acute finding.

Other: Small left posterior scalp hematoma is noted.
IMPRESSION: Small left posterior scalp hematoma. No acute intracranial
abnormality seen.

## 2023-04-14 IMAGING — DX DG CHEST 1V PORT
1 series · 1 of 1 positions shown · non-contrast
Comparison: 03/31/2020

CLINICAL DATA: Cough

EXAM:
PORTABLE CHEST 1 VIEW

[chest ap]
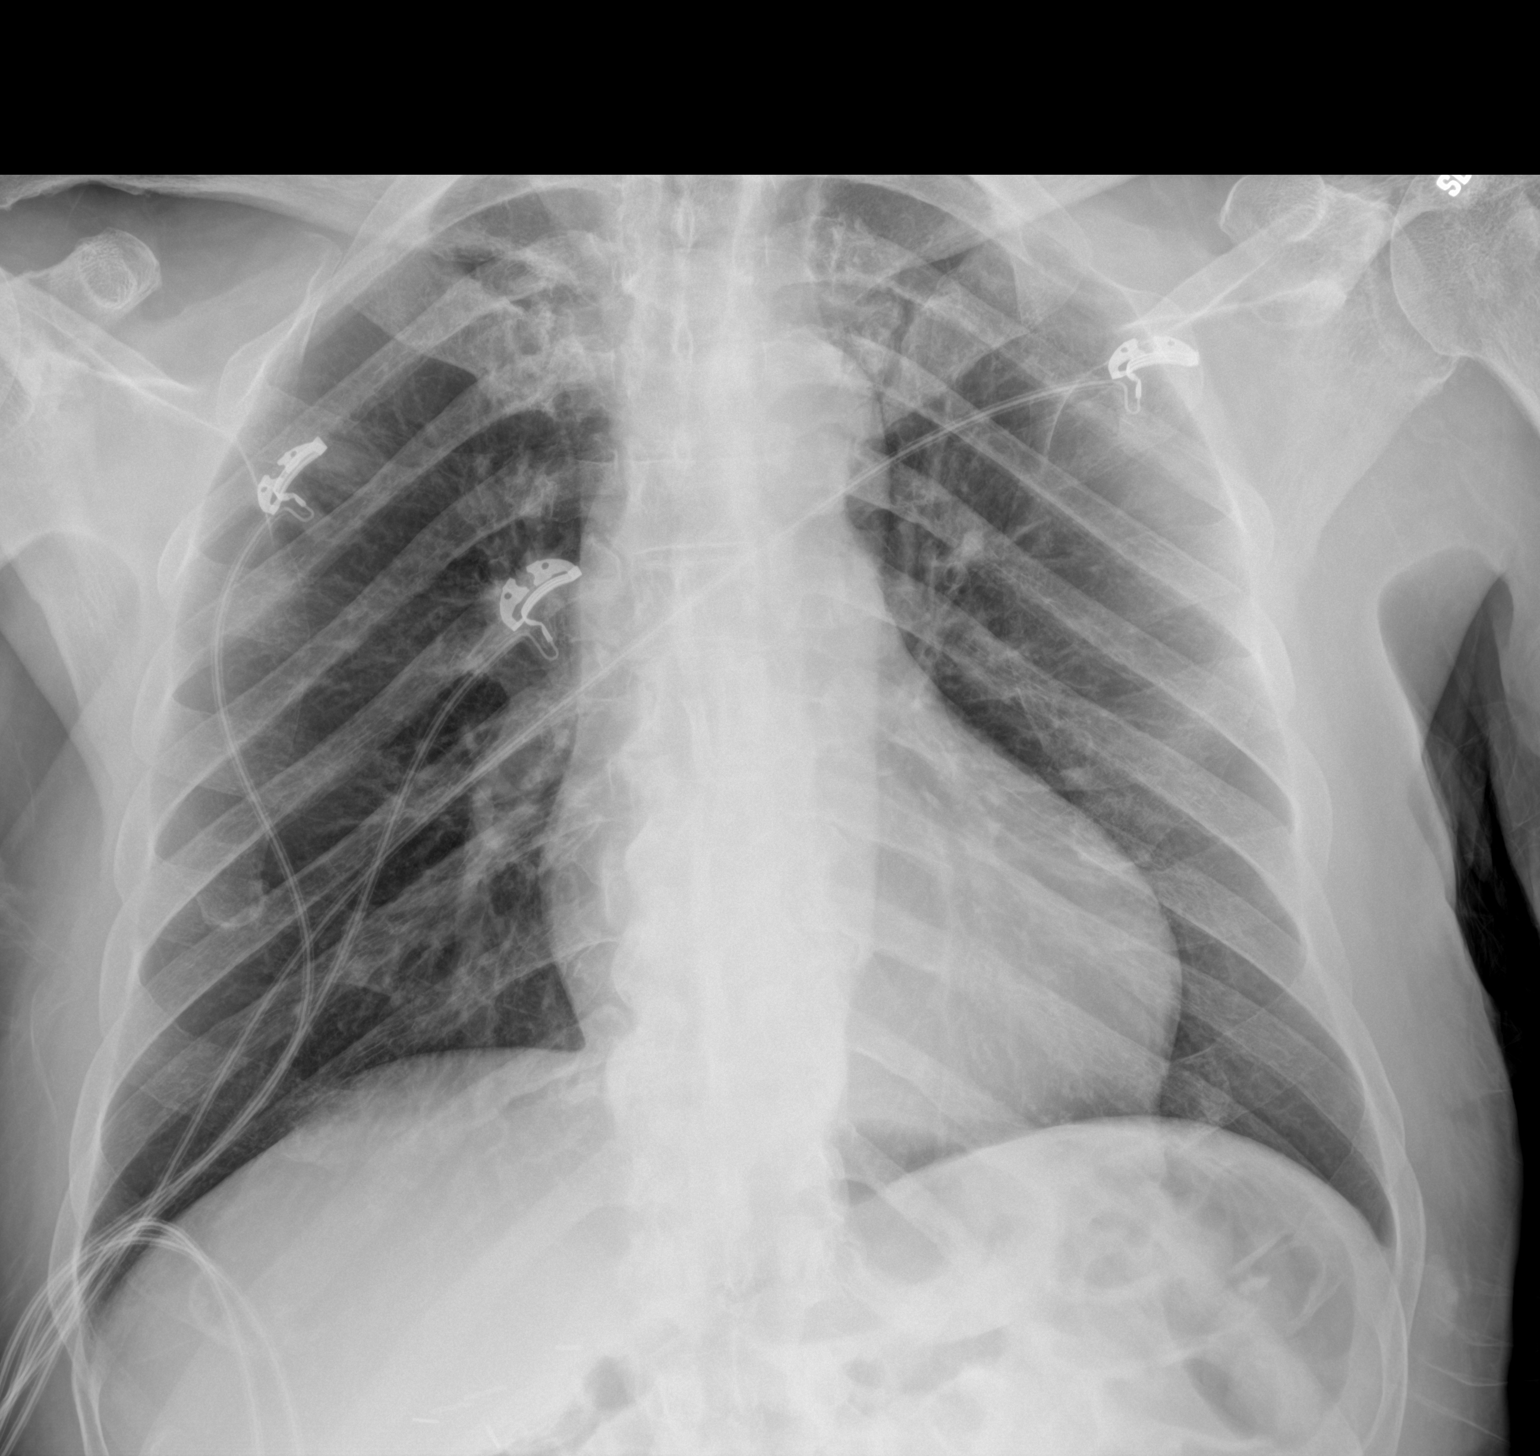

[1 of 1 positions shown; findings below may reference images not displayed]

FINDINGS: The heart size and mediastinal contours are within normal limits.
Both lungs are clear. The visualized skeletal structures are
unremarkable.
IMPRESSION: No active disease.

## 2023-06-08 IMAGING — DX DG CHEST 1V PORT
1 series · 2 of 2 positions shown · non-contrast
Comparison: December 03, 2020

CLINICAL DATA: Questionable sepsis - evaluate for abnormality

EXAM:
PORTABLE CHEST 1 VIEW

[Series 1: chest ap · 0.14mm/px · 2 of 2 slices shown]
[im 1/2]
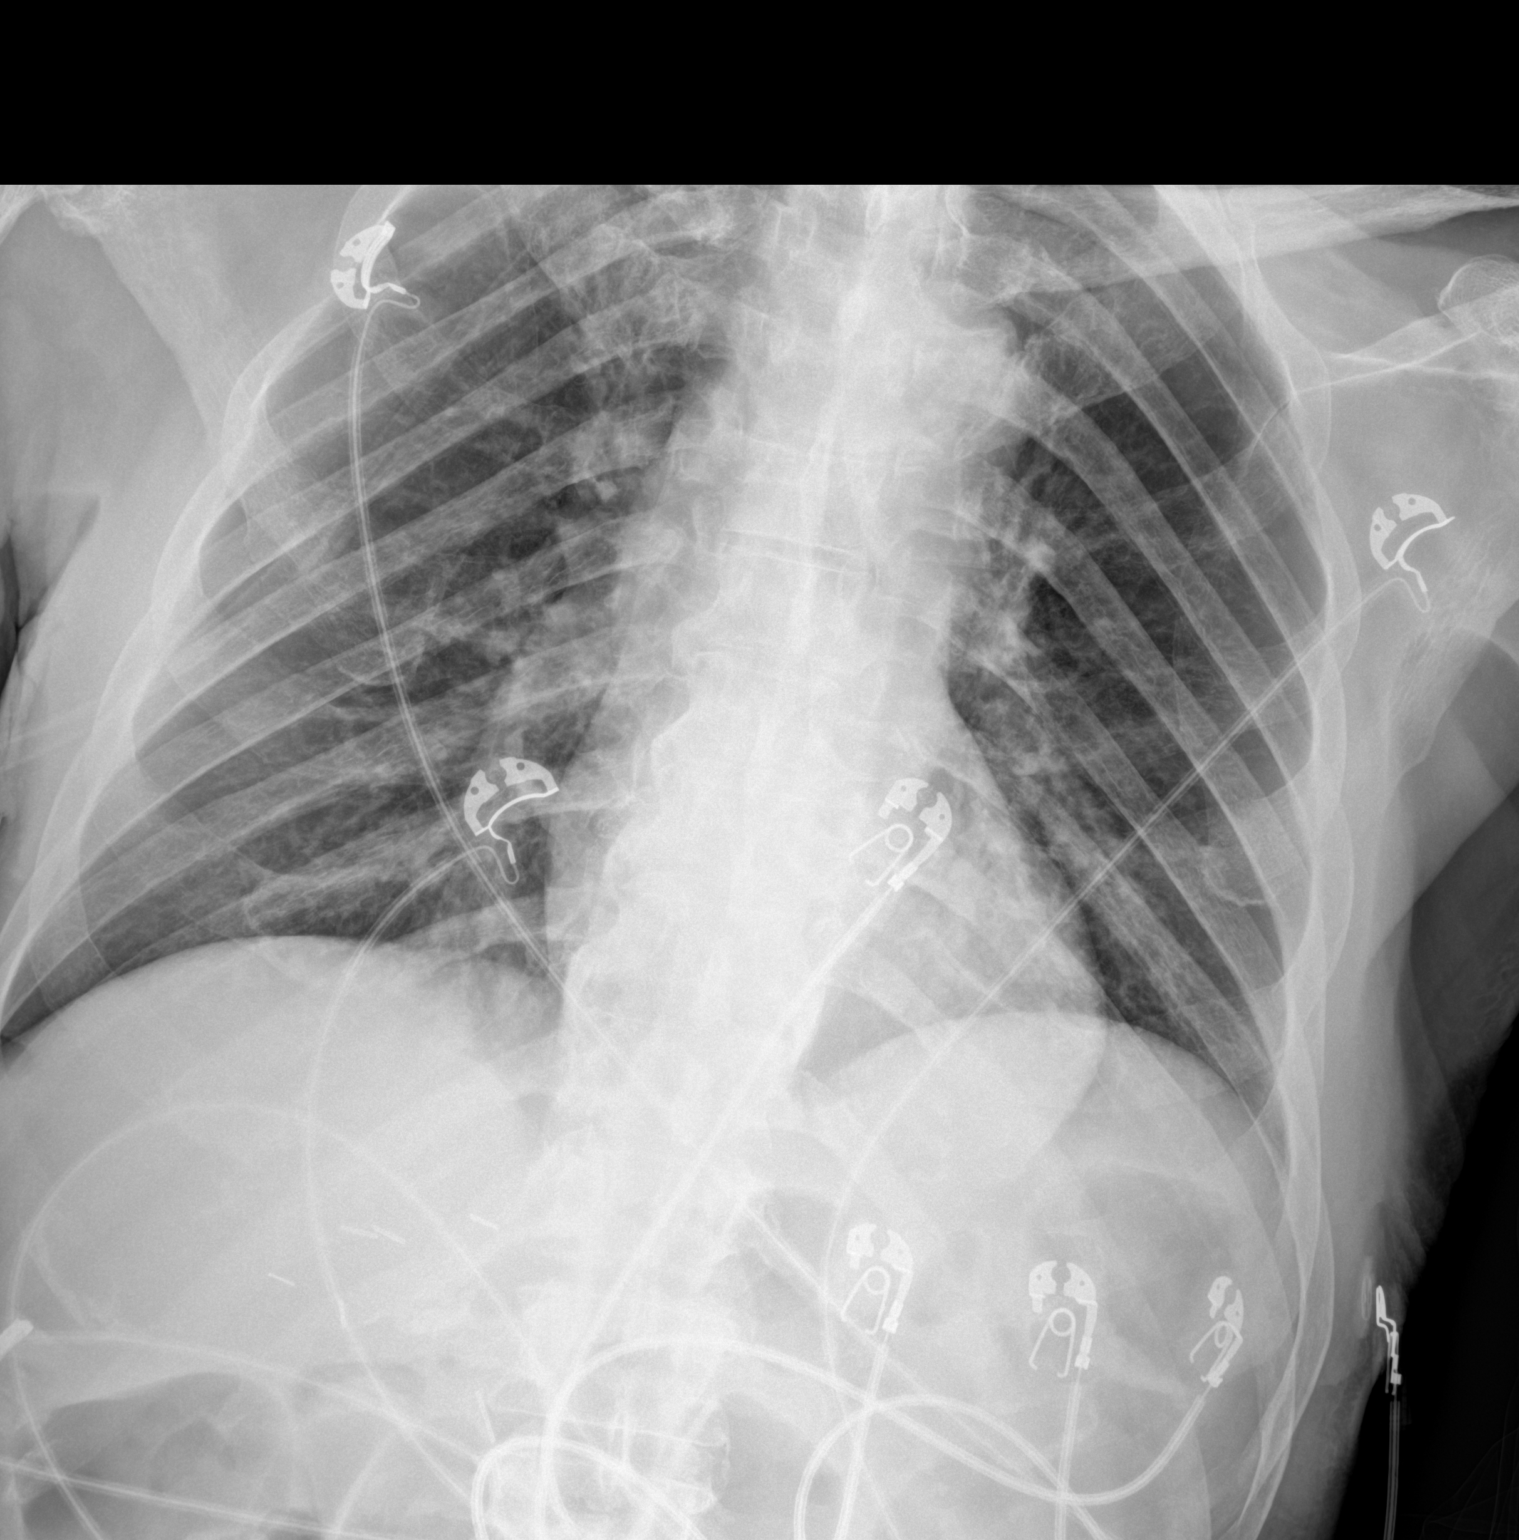
[im 2/2]
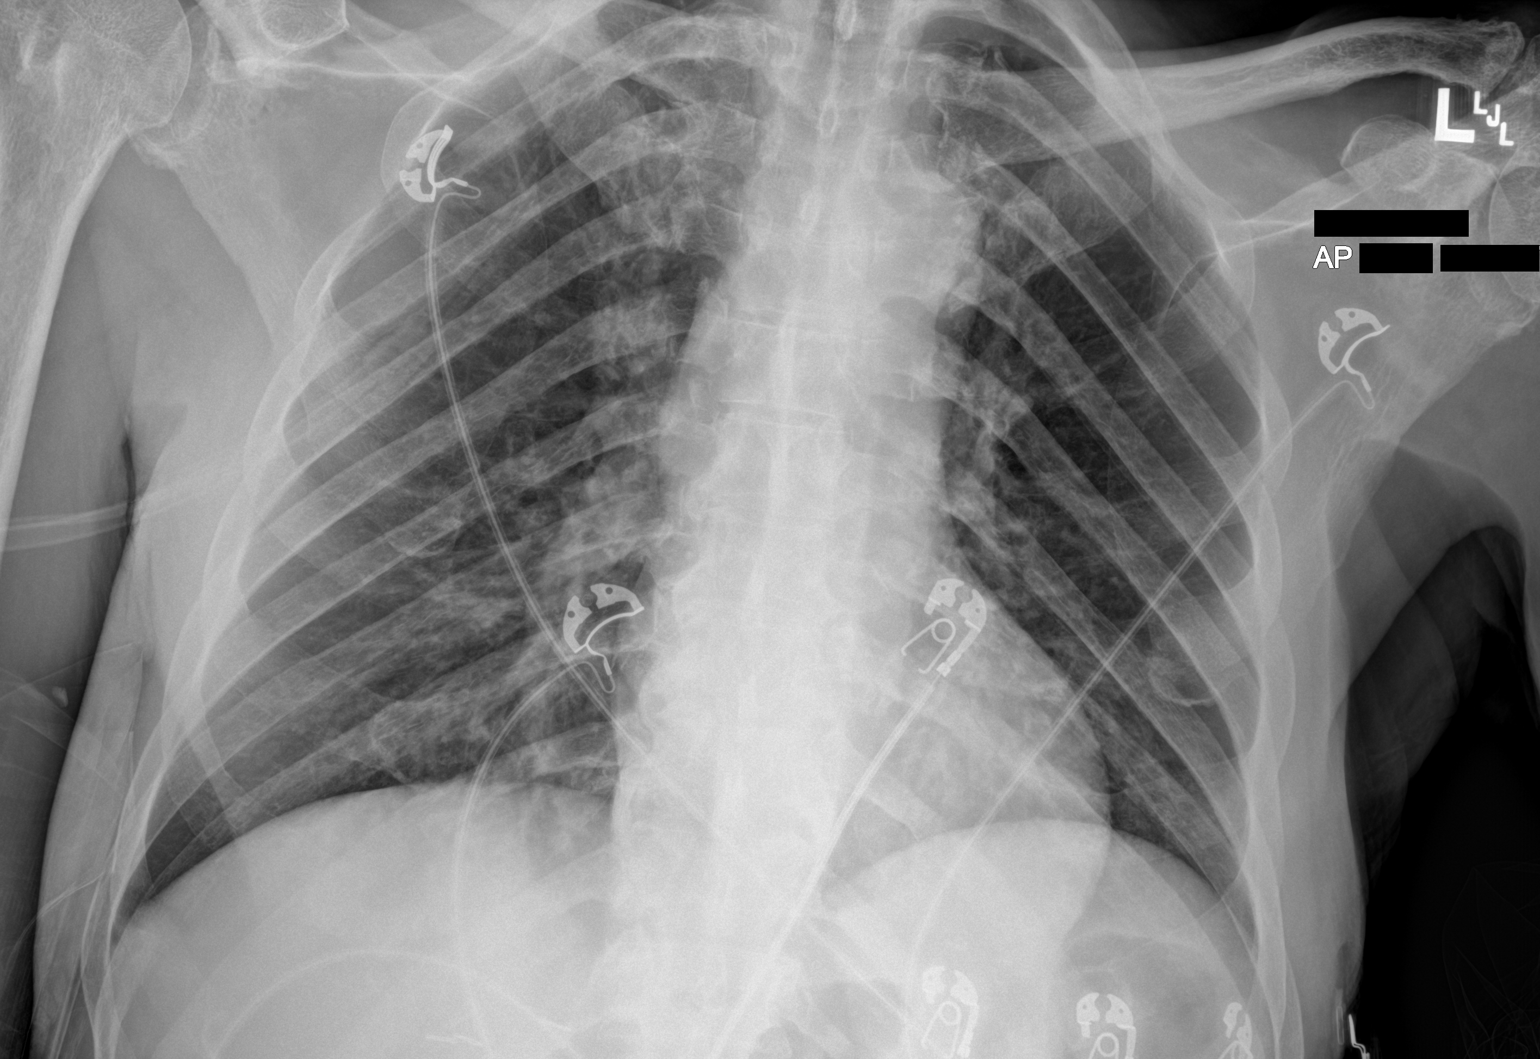

[2 of 2 positions shown; findings below may reference images not displayed]

FINDINGS: The cardiomediastinal silhouette is unchanged in contour. No pleural
effusion. No pneumothorax. No acute pleuroparenchymal abnormality.
Surgical clips project over the upper abdomen. Multilevel
degenerative changes of the thoracic spine.
IMPRESSION: No acute cardiopulmonary abnormality.
